# Patient Record
Sex: Male | Born: 1940 | Hispanic: No | Marital: Married | Smoking: Never smoker
Health system: Southern US, Community
[De-identification: ages and names within clinical notes are randomized; demographics above are authoritative.]

## PROBLEM LIST (undated history)

## (undated) DIAGNOSIS — N400 Enlarged prostate without lower urinary tract symptoms: Secondary | ICD-10-CM

## (undated) DIAGNOSIS — T7840XA Allergy, unspecified, initial encounter: Secondary | ICD-10-CM

## (undated) DIAGNOSIS — M109 Gout, unspecified: Secondary | ICD-10-CM

## (undated) DIAGNOSIS — D649 Anemia, unspecified: Secondary | ICD-10-CM

## (undated) DIAGNOSIS — M1990 Unspecified osteoarthritis, unspecified site: Secondary | ICD-10-CM

## (undated) DIAGNOSIS — M549 Dorsalgia, unspecified: Secondary | ICD-10-CM

## (undated) HISTORY — PX: LITHOTRIPSY: SUR834

## (undated) HISTORY — DX: Anemia, unspecified: D64.9

## (undated) HISTORY — DX: Unspecified osteoarthritis, unspecified site: M19.90

## (undated) HISTORY — PX: EYE SURGERY: SHX253

## (undated) HISTORY — DX: Allergy, unspecified, initial encounter: T78.40XA

## (undated) HISTORY — PX: OTHER SURGICAL HISTORY: SHX169

---

## 2016-02-28 ENCOUNTER — Emergency Department (HOSPITAL_COMMUNITY): Payer: Medicare Other

## 2016-02-28 ENCOUNTER — Emergency Department (HOSPITAL_COMMUNITY)
Admission: EM | Admit: 2016-02-28 | Discharge: 2016-02-28 | Disposition: A | Discharge status: Home / Self Care | Payer: Medicare Other | Attending: Emergency Medicine | Admitting: Emergency Medicine

## 2016-02-28 ENCOUNTER — Encounter (HOSPITAL_COMMUNITY): Payer: Self-pay

## 2016-02-28 DIAGNOSIS — Z79899 Other long term (current) drug therapy: Secondary | ICD-10-CM | POA: Insufficient documentation

## 2016-02-28 DIAGNOSIS — M5489 Other dorsalgia: Secondary | ICD-10-CM | POA: Diagnosis not present

## 2016-02-28 DIAGNOSIS — I10 Essential (primary) hypertension: Secondary | ICD-10-CM | POA: Diagnosis not present

## 2016-02-28 DIAGNOSIS — M545 Low back pain, unspecified: Secondary | ICD-10-CM

## 2016-02-28 DIAGNOSIS — R52 Pain, unspecified: Secondary | ICD-10-CM | POA: Diagnosis not present

## 2016-02-28 DIAGNOSIS — M5442 Lumbago with sciatica, left side: Secondary | ICD-10-CM | POA: Insufficient documentation

## 2016-02-28 HISTORY — DX: Dorsalgia, unspecified: M54.9

## 2016-02-28 MED ORDER — HYDROCODONE-ACETAMINOPHEN 5-325 MG PO TABS: 1.0000 | ORAL_TABLET | ORAL | 0 refills | Status: DC | PRN

## 2016-02-28 MED ORDER — FENTANYL CITRATE (PF) 100 MCG/2ML IJ SOLN
50.0000 ug | Freq: Once | INTRAMUSCULAR | Status: AC
  Administered 2016-02-28: 50 ug via INTRAVENOUS
  Filled 2016-02-28: qty 2

## 2016-02-28 NOTE — Progress Notes (Signed)
ED CM noted Medicaid of [place] pt without a pcp listed ED Cm assessed pt to confirm with him there is no pcp Pt [place] in [place]', is from [place]" He has his wife and daughter her who "went to school at [employer]" States daughter is a doctor "this is the reason I come here"  Cm discussed provider for follow up medical care [facility] and provided pt a list of [place]icaid providers to assist with his choice of medicaid providers/pcp Placed this list on pt's blue bag on bedside table Pt very appreciative of resources offered Pt offered warm blankets

## 2016-02-28 NOTE — Discharge Instructions (Signed)
Take your medications as prescribed as needed for pain relief. I recommend continuing to rest and refrain from doing any heavy lifting or repetitive movements exacerbate her symptoms for the next few days. I recommend calling the neurosurgery clinic listed below tomorrow to follow-up regarding your appointment with Dr. Conchita ParisNundkumar on Thursday morning.  Please return to the Emergency Department if symptoms worsen or new onset of fever, numbness, tingling, groin anesthesia, loss of bowel or bladder, weakness, abdominal pain, chest pain, difficulty breathing.

## 2016-02-28 NOTE — ED Provider Notes (Signed)
[facility]-EMERGENCY DEPT Provider Note   CSN: [id number] Arrival date & time: 02/28/16  1101     History   Chief Complaint Chief Complaint  Patient presents with  . Back Pain    HPI Levi Phillips is a 75 y.o. male.  HPI   Patient is a 75 year old male with history of low back pain who presents the ED via EMS with complaint of worsening low back pain. Patient reports a week ago when he was carrying a large crate of water bottles into his house he began having worsening left lower back pain. Patient reports history of low back pain and sciatica and notes his back pain today feels similar to episodes he has had in the past. Denies any recent fall, trauma or injury. Patient reports pain is worse with movement including bending and twisting. Patient states he has been taking anti-inflammatories at home without relief. He notes he has been seen by multiple doctors in [place] regarding his symptoms. He reports having an MRI performed in May 2017 in which he was told "the area of my back near L4 is moving". Patient states he was told by multiple doctors he needed to have surgery in his back but states when he went to have a second opinion he was told not to have surgery due to possible complications related to the surgery. Patient reports he has had difficulty walking at home due to his worsening pain. Pt denies fever, numbness, tingling, saddle anesthesia, loss of bowel or bladder, chest pain, SOB, abdominal pain, N/V, diarrhea, constipation, weakness, IVDU, cancer or recent spinal manipulation. EMS report that they found the patient crawling on the ground at home due to his back pain. EMS administered 100 mcg fentanyl en route, pt reports mild improvement of pain.   Past Medical History:  Diagnosis Date  . Back pain   . Hypertension     There are no active problems to display for this patient.   History reviewed. No pertinent surgical history.     Home Medications    Prior to Admission  medications   Medication Sig Start Date End Date Taking? Authorizing Provider  allopurinol (ZYLOPRIM) 100 MG tablet Take 100 mg by mouth daily.   Yes Historical Provider, MD  naproxen (NAPROSYN) 250 MG tablet Take 500 mg by mouth 2 (two) times daily as needed for moderate pain.   Yes Historical Provider, MD  tamsulosin (FLOMAX) 0.4 MG CAPS capsule Take 0.4 mg by mouth daily.   Yes Historical Provider, MD  HYDROcodone-acetaminophen (NORCO/VICODIN) 5-325 MG tablet Take 1 tablet by mouth every 4 (four) hours as needed. 02/28/16   Barrett Henle, PA-C    Family History No family history on file.  Social History Social History  Substance Use Topics  . Smoking status: Not on file  . Smokeless tobacco: Not on file  . Alcohol use Not on file     Allergies   Patient has no known allergies.   Review of Systems Review of Systems  Musculoskeletal: Positive for back pain.  All other systems reviewed and are negative.    Physical Exam Updated Vital Signs BP 125/77 (BP Location: Left Arm)   Pulse 71   Temp 97.5 F (36.4 C) (Oral)   Resp 19   Ht 5' 10.08" (1.78 m)   Wt 81.6 kg   SpO2 97%   BMI 25.77 kg/m   Physical Exam  Constitutional: He is oriented to person, place, and time. He appears well-developed and well-nourished.  Pt appears  to be in mild discomfort with certain movements  HENT:  Head: Normocephalic and atraumatic.  Eyes: Conjunctivae and EOM are normal. Right eye exhibits no discharge. Left eye exhibits no discharge. No scleral icterus.  Neck: Normal range of motion. Neck supple.  Cardiovascular: Normal rate, regular rhythm, normal heart sounds and intact distal pulses.   Pulmonary/Chest: Effort normal and breath sounds normal. No respiratory distress. He has no wheezes. He has no rales. He exhibits no tenderness.  Abdominal: Soft. Bowel sounds are normal. He exhibits no distension and no mass. There is no tenderness. There is no rebound and no guarding.    Musculoskeletal: Normal range of motion. He exhibits tenderness. He exhibits no edema or deformity.  No midline C, T, or L tenderness. TTP over left lumbar paraspinal muscles and left SI joint. Full range of motion of neck and decreased ROM of back due to reported pain. Pt able to sit up on the side of the bed but endorses worsening pain. Full range of motion of bilateral upper and lower extremities, with 5/5 strength, worsening back pain with full flexion and extension of left leg/hip. Sensation intact. 2+ radial and PT pulses. Cap refill <2 seconds. Unable to ambulate due to reported pain.   Neurological: He is alert and oriented to person, place, and time. He has normal strength. He displays normal reflexes. No sensory deficit.  Skin: Skin is warm and dry.  Nursing note and vitals reviewed.    ED Treatments / Results  Labs (all labs ordered are listed, but only abnormal results are displayed) Labs Reviewed - No data to display  EKG  EKG Interpretation None       Radiology Mr Lumbar Spine Wo Contrast  Result Date: 02/28/2016 CLINICAL DATA:  Low back pain radiating into both legs for 3 days. EXAM: MRI LUMBAR SPINE WITHOUT CONTRAST TECHNIQUE: Multiplanar, multisequence MR imaging of the lumbar spine was performed. No intravenous contrast was administered. COMPARISON:  None. FINDINGS: Segmentation: Normal. The lowest disc space is considered to be L5-S1. Alignment:  Grade 1 anterolisthesis at L4-L5. Vertebrae: No acute compression fracture, facet edema or focal marrow lesion. Conus medullaris: Extends to the L1 level and appears normal. Paraspinal and other soft tissues: The visualized aorta, IVC and iliac vessels are normal. The visualized retroperitoneal organs and paraspinal soft tissues are normal. Disc levels: T12-L1: Evaluated on sagittal images only. No significant disc herniation, spinal canal stenosis or neuroforaminal narrowing. L1-L2: Small left subarticular disc protrusion. No  spinal canal stenosis. No neuroforaminal stenosis. L2-L3: Disc desiccation with left eccentric bulge, which narrows the left lateral recess. No spinal canal stenosis. Mild left neuroforaminal stenosis. L3-L4: Disc desiccation and diffuse disc bulge with moderate bilateral facet hypertrophy. Central annular fissure. No spinal canal stenosis. Mild right and moderate left neuroforaminal stenosis. L4-L5: Grade 1 anterolisthesis with large right foraminal disc extrusion with superior migration, extending into the extraforaminal space. There is severe bilateral facet hypertrophy. Moderate spinal canal stenosis. Severe right neuroforaminal stenosis with impingement of the exiting right L4 nerve root. L5-S1: Medium-sized left subarticular disc extrusion with inferior migration narrows the left lateral recess and displaces the descending left S1 nerve root. No central spinal canal stenosis. No compressive neuroforaminal stenosis. IMPRESSION: 1. Large right foraminal/extraforaminal L4-L5 disc extrusion with superior migration, causing impingement of the exiting right L4 nerve root. There is also moderate to severe spinal canal stenosis at this level due to combination of disc disease and severe facet arthrosis. 2. Medium-sized left subarticular L5-S1 disc extrusion  with inferior migration narrowing the left lateral recess and displacing the descending left S1 nerve root. Correlate for left S1 radiculopathy. 3. 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 Electronically Signed   By: Deatra RobinsonKevin  Herman M.D.   On: 02/28/2016 13:35    Procedures Procedures (including critical care time)  Medications Ordered in ED Medications  fentaNYL (SUBLIMAZE) injection 50 mcg (50 mcg Intravenous Given 02/28/16 1152)  fentaNYL (SUBLIMAZE) injection 50 mcg (50 mcg Intravenous Given 02/28/16 1348)     Initial Impression / Assessment and Plan / ED Course  I have reviewed the triage vital signs and the nursing  notes.  Pertinent labs & imaging results that were available during my care of the patient were reviewed by me and considered in my medical decision making (see chart for details).  Clinical Course     Patient presents with worsening left lower back pain that radiates down the back of his left leg. Patient reports he has been evaluated in the past regarding his back pain. He reports being seen by multiple physicians in [place] where he is from and states he had an MRI performed in May 2017 showing movement near L4. Patient denies any recent falls but reports having worsening pain after carrying a large crate of water bottles last weekend. No neuro deficits. VSS. Exam revealed tenderness over left lumbar paraspinal muscles and left SI joint. Patient reports worsening pain with flexion and extension of left hip. Remaining exam unremarkable. No neuro deficits. Discussed pt with Dr. Judd Lienelo. Due to pt not having any medical care established in the [place], hx of sciatica and worsening back pain resulting in the pt being unable to ambulate at home, will order MRI for further evaluation. Patient given pain meds in the ED. MRI revealed large right foraminal L4-L5 disc extrusion with impingement to right L4 nerve root, moderate-severe spinal canal stenosis, medium left L5-S1 disc extrusion with migration narrowing the left lateral recess and displacing the descending left S1 nerve root. Consulted neurosurgery. Dr. Conchita ParisNundkumar reviewed pt's MRI and advised to control pt's pain and plan to have pt follow up with him in his office Thursday morning. On reevaluation patient is resting comfortably in bed and reports significant improvement of his pain. Patient able to stand and ambulate with mild discomfort. Discussed results and plan with patient.  Discussed strict return precautions. Patient reports understanding and agreement.  Final Clinical Impressions(s) / ED Diagnoses   Final diagnoses:  Low back pain  Acute left-sided low  back pain with left-sided sciatica    New Prescriptions New Prescriptions   HYDROCODONE-ACETAMINOPHEN (NORCO/VICODIN) 5-325 MG TABLET    Take 1 tablet by mouth every 4 (four) hours as needed.     Satira Sarkicole Elizabeth [place], [place]-C 02/28/16 1541    Geoffery Lyonsouglas Delo, MD 02/28/16 [phone]

## 2016-02-28 NOTE — ED Notes (Signed)
Patient transported to MRI 

## 2016-02-28 NOTE — Progress Notes (Signed)
Entered in d/c instructions Please use the list of [place] medicaid providers to assist with finding a provider for follow up medical care      If further assistance needed in finding medicaid providers you may also contact the Department of social services at [address] [place] [phone] Medicaid specialists can be viewed at [email or web address].ncdhhs.gov/   Next Steps: Schedule an appointment as soon as possible for a visit  Instructions: medicaid patients have access to medicaid transportation to appointments by calling [phone]  In [place] there is access to [place] resources [place] at [phone] for rides, meals on wheels or go to [email or web address]

## 2016-02-28 NOTE — ED Notes (Signed)
Patient returned from MRI and c/o pain. Jeanella CaraMade Nicole PA aware. Awaiting new orders.

## 2016-02-28 NOTE — ED Notes (Signed)
Went over discharge instructions and prescription for pain medication. patient request to go over instructions with his daughter when she comes to pick him up.  Daughter is at a doctor appt with patient's wife, so will be little while before she can get here to pick patient up.

## 2016-03-01 DIAGNOSIS — M4316 Spondylolisthesis, lumbar region: Secondary | ICD-10-CM | POA: Diagnosis not present

## 2016-03-08 ENCOUNTER — Other Ambulatory Visit: Payer: Self-pay | Admitting: Neurosurgery

## 2016-03-13 ENCOUNTER — Encounter (HOSPITAL_COMMUNITY)
Admission: RE | Admit: 2016-03-13 | Discharge: 2016-03-13 | Disposition: A | Discharge status: Home / Self Care | Payer: Medicare Other | Source: Ambulatory Visit | Attending: Neurosurgery | Admitting: Neurosurgery

## 2016-03-13 ENCOUNTER — Encounter (HOSPITAL_COMMUNITY): Payer: Self-pay

## 2016-03-13 DIAGNOSIS — Z01818 Encounter for other preprocedural examination: Secondary | ICD-10-CM | POA: Diagnosis not present

## 2016-03-13 DIAGNOSIS — M4316 Spondylolisthesis, lumbar region: Secondary | ICD-10-CM | POA: Diagnosis not present

## 2016-03-13 HISTORY — DX: Gout, unspecified: M10.9

## 2016-03-13 HISTORY — DX: Benign prostatic hyperplasia without lower urinary tract symptoms: N40.0

## 2016-03-13 LAB — BASIC METABOLIC PANEL
Anion gap: 10 (ref 5–15)
BUN: 16 mg/dL (ref 6–20)
CALCIUM: 9.8 mg/dL (ref 8.9–10.3)
CO2: 22 mmol/L (ref 22–32)
CREATININE: 1.13 mg/dL (ref 0.61–1.24)
Chloride: 105 mmol/L (ref 101–111)
Glucose, Bld: 98 mg/dL (ref 65–99)
Potassium: 4.3 mmol/L (ref 3.5–5.1)
SODIUM: 137 mmol/L (ref 135–145)

## 2016-03-13 LAB — CBC
HCT: 45.4 % (ref 39.0–52.0)
Hemoglobin: 16.3 g/dL (ref 13.0–17.0)
MCH: 30.5 pg (ref 26.0–34.0)
MCHC: 35.9 g/dL (ref 30.0–36.0)
MCV: 84.9 fL (ref 78.0–100.0)
PLATELETS: 160 10*3/uL (ref 150–400)
RBC: 5.35 MIL/uL (ref 4.22–5.81)
RDW: 13.1 % (ref 11.5–15.5)
WBC: 9.4 10*3/uL (ref 4.0–10.5)

## 2016-03-13 LAB — TYPE AND SCREEN
ABO/RH(D): O POS
ANTIBODY SCREEN: NEGATIVE

## 2016-03-13 LAB — SURGICAL PCR SCREEN
MRSA, PCR: NEGATIVE
STAPHYLOCOCCUS AUREUS: NEGATIVE

## 2016-03-13 LAB — ABO/RH: ABO/RH(D): O POS

## 2016-03-13 MED ORDER — CHLORHEXIDINE GLUCONATE CLOTH 2 % EX PADS: 6.0000 | MEDICATED_PAD | Freq: Once | CUTANEOUS | Status: DC

## 2016-03-13 NOTE — Pre-Procedure Instructions (Signed)
Waldemar DickensChanguiz Thelen  03/13/2016      [id number] - , [place] - [address] [place] [address] Phone: [phone] Fax: [phone]    Your procedure is scheduled on Fri, Jan 5 @ 1:15 PM  [facility] Admitting at 11:15 AM  Call this number if you have problems the morning of surgery:  [phone]   Remember:  Do not eat food or drink liquids after midnight.  Take these medicines the morning of surgery with A SIP OF WATER Allopurinol(Zyloprim),Flomax(Tamsulosin),and Pain Pill(if needed)             Stop taking your Naproxen. No Goody's,BC's,Advil,Motrin,Ibuprofen,Fish Oil,or any Herbal Medications.    Do not wear jewelry.  Do not wear lotions, powders,colognes, or deoderant.             Men may shave face and neck.  Do not bring valuables to the hospital.  [facility] is not responsible for any belongings or valuables.  Contacts, dentures or bridgework may not be worn into surgery.  Leave your suitcase in the car.  After surgery it may be brought to your room.  For patients admitted to the hospital, discharge time will be determined by your treatment team.  Patients discharged the day of surgery will not be allowed to drive home.    [facility] Pre[facility]  Before surgery, you can play an important role.  Because skin is not sterile, your skin needs to be as free of germs as possible.  You can reduce the number of germs on you skin by washing with CHG (chlorahexidine gluconate) soap before surgery.  CHG is an antiseptic cleaner which kills germs and bonds with the skin to continue killing germs even after washing.  Please DO NOT use if you have an allergy to CHG or antibacterial soaps.  If your skin becomes reddened/irritated stop using the CHG and inform your nurse when you arrive at Short Stay.  Do not shave (including legs and underarms) for at least 48 hours prior to the  first CHG shower.  You may shave your face.  Please follow these instructions carefully:   1.  Shower with CHG Soap the night before surgery and the                                morning of Surgery.  2.  If you choose to wash your hair, wash your hair first as usual with your       normal shampoo.  3.  After you shampoo, rinse your hair and body thoroughly to remove the                      Shampoo.  4.  Use CHG as you would any other liquid soap.  You can apply chg directly       to the skin and wash gently with scrungie or a clean washcloth.  5.  Apply the CHG Soap to your body ONLY FROM THE NECK DOWN.        Do not use on open wounds or open sores.  Avoid contact with your eyes,       ears, mouth and genitals (private parts).  Wash genitals (private parts)       with your normal soap.  6.  Wash thoroughly, paying special attention to the area  where your surgery        will be performed.  7.  Thoroughly rinse your body with warm water from the neck down.  8.  DO NOT shower/wash with your normal soap after using and rinsing off       the CHG Soap.  9.  Pat yourself dry with a clean towel.            10.  Wear clean pajamas.            11.  Place clean sheets on your bed the night of your first shower and do not        sleep with pets.  Day of Surgery  Do not apply any lotions/deoderants the morning of surgery.  Please wear clean clothes to the hospital/surgery center.    Please read over the following fact sheets that you were given. Pain Booklet, Coughing and Deep Breathing, MRSA Information and Surgical Site Infection Prevention

## 2016-03-13 NOTE — Progress Notes (Signed)
Cardiologist denies  Medical MD denies  Echo/stress test/heart cath denies  EKG and CXR denies in past yr

## 2016-03-16 ENCOUNTER — Encounter (HOSPITAL_COMMUNITY): Admission: RE | Payer: Self-pay | Source: Ambulatory Visit

## 2016-03-16 ENCOUNTER — Inpatient Hospital Stay (HOSPITAL_COMMUNITY): Admission: RE | Admit: 2016-03-16 | Payer: Medicaid Other | Source: Ambulatory Visit | Admitting: Neurosurgery

## 2016-03-16 SURGERY — POSTERIOR LUMBAR FUSION 1 LEVEL
Anesthesia: General

## 2016-03-26 DIAGNOSIS — M4310 Spondylolisthesis, site unspecified: Secondary | ICD-10-CM | POA: Diagnosis not present

## 2016-03-26 DIAGNOSIS — M5417 Radiculopathy, lumbosacral region: Secondary | ICD-10-CM | POA: Diagnosis not present

## 2016-03-26 DIAGNOSIS — N400 Enlarged prostate without lower urinary tract symptoms: Secondary | ICD-10-CM | POA: Diagnosis not present

## 2016-03-26 DIAGNOSIS — M545 Low back pain: Secondary | ICD-10-CM | POA: Diagnosis not present

## 2016-03-26 DIAGNOSIS — M4807 Spinal stenosis, lumbosacral region: Secondary | ICD-10-CM | POA: Diagnosis not present

## 2016-03-26 DIAGNOSIS — M109 Gout, unspecified: Secondary | ICD-10-CM | POA: Diagnosis not present

## 2016-04-17 DIAGNOSIS — G894 Chronic pain syndrome: Secondary | ICD-10-CM | POA: Diagnosis not present

## 2016-04-17 DIAGNOSIS — Z79891 Long term (current) use of opiate analgesic: Secondary | ICD-10-CM | POA: Diagnosis not present

## 2016-04-17 DIAGNOSIS — M545 Low back pain: Secondary | ICD-10-CM | POA: Diagnosis not present

## 2016-04-17 DIAGNOSIS — M5442 Lumbago with sciatica, left side: Secondary | ICD-10-CM | POA: Diagnosis not present

## 2016-04-17 DIAGNOSIS — M5137 Other intervertebral disc degeneration, lumbosacral region: Secondary | ICD-10-CM | POA: Diagnosis not present

## 2016-04-23 DIAGNOSIS — M545 Low back pain: Secondary | ICD-10-CM | POA: Diagnosis not present

## 2016-05-22 DIAGNOSIS — I10 Essential (primary) hypertension: Secondary | ICD-10-CM | POA: Diagnosis not present

## 2016-05-22 DIAGNOSIS — R404 Transient alteration of awareness: Secondary | ICD-10-CM | POA: Diagnosis not present

## 2016-05-22 DIAGNOSIS — M542 Cervicalgia: Secondary | ICD-10-CM | POA: Diagnosis not present

## 2016-05-22 DIAGNOSIS — R110 Nausea: Secondary | ICD-10-CM | POA: Diagnosis not present

## 2016-05-22 DIAGNOSIS — Z79899 Other long term (current) drug therapy: Secondary | ICD-10-CM | POA: Diagnosis not present

## 2016-05-22 DIAGNOSIS — R9431 Abnormal electrocardiogram [ECG] [EKG]: Secondary | ICD-10-CM | POA: Diagnosis not present

## 2016-05-22 DIAGNOSIS — R42 Dizziness and giddiness: Secondary | ICD-10-CM | POA: Diagnosis not present

## 2016-05-22 DIAGNOSIS — R0602 Shortness of breath: Secondary | ICD-10-CM | POA: Diagnosis not present

## 2016-05-22 DIAGNOSIS — R079 Chest pain, unspecified: Secondary | ICD-10-CM | POA: Diagnosis not present

## 2016-05-22 DIAGNOSIS — R531 Weakness: Secondary | ICD-10-CM | POA: Diagnosis not present

## 2016-05-22 DIAGNOSIS — R0682 Tachypnea, not elsewhere classified: Secondary | ICD-10-CM | POA: Diagnosis not present

## 2016-06-01 DIAGNOSIS — M4310 Spondylolisthesis, site unspecified: Secondary | ICD-10-CM | POA: Diagnosis not present

## 2016-06-01 DIAGNOSIS — Z5181 Encounter for therapeutic drug level monitoring: Secondary | ICD-10-CM | POA: Diagnosis not present

## 2016-06-01 DIAGNOSIS — Z136 Encounter for screening for cardiovascular disorders: Secondary | ICD-10-CM | POA: Diagnosis not present

## 2016-06-01 DIAGNOSIS — N400 Enlarged prostate without lower urinary tract symptoms: Secondary | ICD-10-CM | POA: Diagnosis not present

## 2016-06-01 DIAGNOSIS — Z0000 Encounter for general adult medical examination without abnormal findings: Secondary | ICD-10-CM | POA: Diagnosis not present

## 2016-06-01 DIAGNOSIS — M5417 Radiculopathy, lumbosacral region: Secondary | ICD-10-CM | POA: Diagnosis not present

## 2016-06-01 DIAGNOSIS — Z131 Encounter for screening for diabetes mellitus: Secondary | ICD-10-CM | POA: Diagnosis not present

## 2016-06-01 DIAGNOSIS — M109 Gout, unspecified: Secondary | ICD-10-CM | POA: Diagnosis not present

## 2016-06-01 DIAGNOSIS — Z01118 Encounter for examination of ears and hearing with other abnormal findings: Secondary | ICD-10-CM | POA: Diagnosis not present

## 2016-06-29 DIAGNOSIS — M5417 Radiculopathy, lumbosacral region: Secondary | ICD-10-CM | POA: Diagnosis not present

## 2016-06-29 DIAGNOSIS — N400 Enlarged prostate without lower urinary tract symptoms: Secondary | ICD-10-CM | POA: Diagnosis not present

## 2016-06-29 DIAGNOSIS — K5900 Constipation, unspecified: Secondary | ICD-10-CM | POA: Diagnosis not present

## 2016-06-29 DIAGNOSIS — M545 Low back pain: Secondary | ICD-10-CM | POA: Diagnosis not present

## 2016-06-29 DIAGNOSIS — M109 Gout, unspecified: Secondary | ICD-10-CM | POA: Diagnosis not present

## 2016-06-29 DIAGNOSIS — M4310 Spondylolisthesis, site unspecified: Secondary | ICD-10-CM | POA: Diagnosis not present

## 2017-01-09 ENCOUNTER — Encounter: Payer: Self-pay | Admitting: Physician Assistant

## 2017-01-09 ENCOUNTER — Ambulatory Visit (INDEPENDENT_AMBULATORY_CARE_PROVIDER_SITE_OTHER): Payer: Medicare Other | Admitting: Physician Assistant

## 2017-01-09 VITALS — BP 120/78 | HR 77 | Temp 98.0°F | Resp 18 | Ht 69.0 in | Wt 186.4 lb

## 2017-01-09 DIAGNOSIS — M109 Gout, unspecified: Secondary | ICD-10-CM | POA: Insufficient documentation

## 2017-01-09 DIAGNOSIS — M25561 Pain in right knee: Secondary | ICD-10-CM | POA: Diagnosis not present

## 2017-01-09 DIAGNOSIS — M5136 Other intervertebral disc degeneration, lumbar region: Secondary | ICD-10-CM | POA: Insufficient documentation

## 2017-01-09 DIAGNOSIS — Z6827 Body mass index (BMI) 27.0-27.9, adult: Secondary | ICD-10-CM | POA: Insufficient documentation

## 2017-01-09 DIAGNOSIS — N400 Enlarged prostate without lower urinary tract symptoms: Secondary | ICD-10-CM | POA: Insufficient documentation

## 2017-01-09 NOTE — Progress Notes (Signed)
Patient ID: Levi Phillips, male     DOB: 02-May-1940, 76 y.o.    MRN: [id number]  PCP: Patient, No Pcp Per  Chief Complaint  Patient presents with  . Knee Pain    right knee, x2 month, Pt states he does 15 mintues workouts on a bike at the gym his knee hurts less but when he touches his knee it hurts.    Subjective:   This patient is new to this practice and presents for evaluation of RIGHT knee pain x 2 months.  He does not recall any specific trauma or injury. The medial aspect of the knee is tender on palpation. Exercise of a stationary bike helps. Pain is rated 6/10, but is not present now.  He does daily exercise due to L4-5 disc herniation, and he can walk 45 minutes and ride the bike without pain.  No hip or ankle pain. Different from gout.  Review of Systems   Prior to Admission medications   Medication Sig Start Date End Date Taking? Authorizing Provider  allopurinol (ZYLOPRIM) 100 MG tablet Take 100 mg by mouth daily.   Yes [provider]  tamsulosin (FLOMAX) 0.4 MG CAPS capsule Take 0.4 mg by mouth.   Yes [provider]     No Known Allergies   Patient Active Problem List   Diagnosis Date Noted  . DDD (degenerative disc disease), lumbar 01/09/2017  . Gout 01/09/2017  . BPH (benign prostatic hyperplasia) 01/09/2017  . BMI 27.0-27.9,adult 01/09/2017     Family History  Problem Relation Age of Onset  . Cancer Mother   . Alzheimer's disease Sister   . Alzheimer's disease Sister      Social History   Socioeconomic History  . Marital status: Married    Spouse name: Not on file  . Number of children: Not on file  . Years of education: Not on file  . Highest education level: Not on file  Social Needs  . Financial resource strain: Not on file  . Food insecurity - worry: Not on file  . Food insecurity - inability: Not on file  . Transportation needs - medical: Not on file  . Transportation needs - non-medical: Not on  file  Occupational History  . Not on file  Tobacco Use  . Smoking status: Never Smoker  . Smokeless tobacco: Never Used  Substance and Sexual Activity  . Alcohol use: Yes    Comment: occ beer  . Drug use: No  . Sexual activity: Not on file  Other Topics Concern  . Not on file  Social History Narrative   ** Merged History Encounter **             Objective:  Physical Exam  Constitutional: He is oriented to person, place, and time. He appears well-developed and well-nourished. He is active and cooperative. No distress.  BP 120/78 (BP Location: Left Arm, Patient Position: Sitting, Cuff Size: Normal)   Pulse 77   Temp 98 F (36.7 C) (Oral)   Resp 18   Ht 5\' 9"  (1.753 m)   Wt 186 lb 6.4 oz (84.6 kg)   SpO2 96%   BMI 27.53 kg/m    Eyes: Conjunctivae are normal.  Pulmonary/Chest: Effort normal.  Musculoskeletal:       Right knee: He exhibits normal range of motion (crepitus), no swelling, no effusion, no ecchymosis, no deformity, no laceration, no erythema, normal alignment, no LCL laxity, normal patellar mobility, no bony tenderness, normal meniscus and  no MCL laxity. Tenderness found. Medial joint line (and just below, at the anserine bursa) tenderness noted. No lateral joint line, no MCL, no LCL and no patellar tendon tenderness noted.       Left knee: Normal. Decreased range of motion: crepitus.       Right upper leg: Normal.       Left upper leg: Normal.       Right lower leg: Normal.       Left lower leg: Normal.  Neurological: He is alert and oriented to person, place, and time.  Psychiatric: He has a normal mood and affect. His speech is normal and behavior is normal.             Assessment & Plan:  1. Right medial knee pain Unclear etiology. ?Anserine bursitis? ?Strain? ?Meniscal injury? Acetaminophen PRN. Continue exercise. If pain persists in 2 weeks would proceed with radiographs.    Return in about 2 weeks (around 01/23/2017) for re-evalaution if the  pain continues; please schedule a Wellness Visit with Dorthula Perfect, LPN.   Fernande Bras, PA-C Primary Care at [facility]

## 2017-01-09 NOTE — Patient Instructions (Addendum)
CONTINUE the exercise, as long as it doesn't hurt the knee. TRY acetaminophen (Tylenol) for the pain, as needed.    IF you received an x-ray today, you will receive an invoice from [facility]. Please contact [facility] at [phone] with questions or concerns regarding your invoice.   IF you received labwork today, you will receive an invoice from [facility]. Please contact [facility] at [phone] with questions or concerns regarding your invoice.   Our billing staff will not be able to assist you with questions regarding bills from these companies.  You will be contacted with the lab results as soon as they are available. The fastest way to get your results is to activate your My Chart account. Instructions are located on the last page of this paperwork. If you have not heard from us regarding the results in 2 weeks, please contact this office.

## 2017-01-09 NOTE — Progress Notes (Signed)
   Subjective:    Patient ID: Levi Phillips, male    DOB: Oct 31, 1940, 76 y.o.   MRN: [id number]  HPI Levi Phillips is a 76 year old [place] male with a past medical history significant for arthritis and gout who presents today with right medial knee pain. He states the pain started about 1 month ago. Levi Phillips reports his right knee pain was worse when he touched his knee and improved when he went on the exercise bike. The pain was 6/10. Levi Phillips states he does exercises every day for an L4 and L5 herniated disc. He did not take any medications or apply ice at that time. Levi Phillips reports the pain has now improved. Levi Phillips denies leg weakness. He also states the pain does not radiate anywhere. Levi Phillips reports he walks about 45 minutes every day and he does not have any pain. Levi Phillips also denies pain in his hip or ankle.   Medications:  Prior to Admission medications   Medication Sig Start Date End Date Taking? Authorizing Provider  allopurinol (ZYLOPRIM) 100 MG tablet Take 100 mg by mouth daily.   Yes [provider]  tamsulosin (FLOMAX) 0.4 MG CAPS capsule Take 0.4 mg by mouth.   Yes [provider]   Allergies:  No Known Allergies  Chronic Medical Conditions:  Allergic Rhinitis  Anemia  Depression  Review of Systems     Objective:   Physical Exam  Constitutional: He appears well-developed and well-nourished. He is active and cooperative.  BP 120/78 (BP Location: Left Arm, Patient Position: Sitting, Cuff Size: Normal)   Pulse 77   Temp 98 F (36.7 C) (Oral)   Resp 18   Ht 5\' 9"  (1.753 m)   Wt 186 lb 6.4 oz (84.6 kg)   SpO2 96%   BMI 27.53 kg/m    Musculoskeletal:       Right knee: He exhibits normal range of motion, no swelling, no effusion, no ecchymosis, no deformity, no erythema, no LCL laxity, normal meniscus and no MCL laxity. Tenderness found. Medial joint line tenderness noted.       Left knee: He exhibits normal range of motion, no  swelling, no effusion, no ecchymosis, no deformity, no erythema, normal alignment, no LCL laxity, normal meniscus and no MCL laxity. No tenderness found.       Legs: Neurological: He is alert.  Skin: Skin is warm and dry.      Assessment & Plan:  1. Right medial knee pain - Begin Tylenol as needed for pain  - Encouraged patient to continue exercise as tolerated - Patient instructed to return to clinic in 2 weeks if symptoms do not resolve  Magdaleno Lortie, PA-S

## 2017-01-10 ENCOUNTER — Encounter (HOSPITAL_COMMUNITY): Payer: Self-pay

## 2017-01-23 ENCOUNTER — Ambulatory Visit: Payer: Medicare Other | Admitting: Physician Assistant

## 2017-04-10 ENCOUNTER — Ambulatory Visit (INDEPENDENT_AMBULATORY_CARE_PROVIDER_SITE_OTHER): Payer: Medicare Other | Admitting: Emergency Medicine

## 2017-04-10 ENCOUNTER — Encounter: Payer: Self-pay | Admitting: Emergency Medicine

## 2017-04-10 ENCOUNTER — Other Ambulatory Visit: Payer: Self-pay

## 2017-04-10 ENCOUNTER — Emergency Department (HOSPITAL_COMMUNITY): Payer: Medicare Other

## 2017-04-10 ENCOUNTER — Encounter (HOSPITAL_COMMUNITY): Payer: Self-pay | Admitting: Emergency Medicine

## 2017-04-10 ENCOUNTER — Emergency Department (HOSPITAL_COMMUNITY)
Admission: EM | Admit: 2017-04-10 | Discharge: 2017-04-10 | Disposition: A | Discharge status: Home / Self Care | Payer: Medicare Other | Attending: Emergency Medicine | Admitting: Emergency Medicine

## 2017-04-10 VITALS — BP 189/129 | HR 78 | Temp 97.8°F | Resp 16 | Wt 179.0 lb

## 2017-04-10 DIAGNOSIS — N1330 Unspecified hydronephrosis: Secondary | ICD-10-CM | POA: Diagnosis not present

## 2017-04-10 DIAGNOSIS — R1032 Left lower quadrant pain: Secondary | ICD-10-CM | POA: Diagnosis present

## 2017-04-10 DIAGNOSIS — R109 Unspecified abdominal pain: Secondary | ICD-10-CM

## 2017-04-10 DIAGNOSIS — Z79899 Other long term (current) drug therapy: Secondary | ICD-10-CM | POA: Diagnosis not present

## 2017-04-10 DIAGNOSIS — N23 Unspecified renal colic: Secondary | ICD-10-CM | POA: Insufficient documentation

## 2017-04-10 DIAGNOSIS — M545 Low back pain, unspecified: Secondary | ICD-10-CM | POA: Insufficient documentation

## 2017-04-10 DIAGNOSIS — Z87442 Personal history of urinary calculi: Secondary | ICD-10-CM | POA: Diagnosis not present

## 2017-04-10 LAB — BASIC METABOLIC PANEL
Anion gap: 11 (ref 5–15)
BUN: 21 mg/dL — AB (ref 6–20)
CALCIUM: 9.5 mg/dL (ref 8.9–10.3)
CO2: 20 mmol/L — ABNORMAL LOW (ref 22–32)
CREATININE: 1.35 mg/dL — AB (ref 0.61–1.24)
Chloride: 102 mmol/L (ref 101–111)
GFR calc Af Amer: 57 mL/min — ABNORMAL LOW (ref >60)
GFR, EST NON AFRICAN AMERICAN: 49 mL/min — AB (ref >60)
GLUCOSE: 109 mg/dL — AB (ref 65–99)
Potassium: 3.6 mmol/L (ref 3.5–5.1)
Sodium: 133 mmol/L — ABNORMAL LOW (ref 135–145)

## 2017-04-10 LAB — POC URINALSYSI DIPSTICK (AUTOMATED)
Bilirubin, UA: NEGATIVE
Glucose, UA: NEGATIVE
KETONES UA: NEGATIVE
Leukocytes, UA: NEGATIVE
Nitrite, UA: NEGATIVE
PH UA: 6 (ref 5.0–8.0)
PROTEIN UA: NEGATIVE
SPEC GRAV UA: 1.025 (ref 1.010–1.025)
UROBILINOGEN UA: 0.2 U/dL

## 2017-04-10 LAB — CBC WITH DIFFERENTIAL/PLATELET
BASOS ABS: 0 10*3/uL (ref 0.0–0.1)
BASOS PCT: 0 %
EOS ABS: 0 10*3/uL (ref 0.0–0.7)
EOS PCT: 0 %
HCT: 40.4 % (ref 39.0–52.0)
Hemoglobin: 14.1 g/dL (ref 13.0–17.0)
LYMPHS PCT: 9 %
Lymphs Abs: 0.9 10*3/uL (ref 0.7–4.0)
MCH: 29.7 pg (ref 26.0–34.0)
MCHC: 34.9 g/dL (ref 30.0–36.0)
MCV: 85.2 fL (ref 78.0–100.0)
MONO ABS: 0.4 10*3/uL (ref 0.1–1.0)
Monocytes Relative: 4 %
Neutro Abs: 8.6 10*3/uL — ABNORMAL HIGH (ref 1.7–7.7)
Neutrophils Relative %: 87 %
PLATELETS: 185 10*3/uL (ref 150–400)
RBC: 4.74 MIL/uL (ref 4.22–5.81)
RDW: 13.4 % (ref 11.5–15.5)
WBC: 9.9 10*3/uL (ref 4.0–10.5)

## 2017-04-10 MED ORDER — KETOROLAC TROMETHAMINE 15 MG/ML IJ SOLN
15.0000 mg | Freq: Once | INTRAMUSCULAR | Status: AC
  Administered 2017-04-10: 15 mg via INTRAVENOUS
  Filled 2017-04-10: qty 1

## 2017-04-10 MED ORDER — OXYCODONE-ACETAMINOPHEN 5-325 MG PO TABS: 2.0000 | ORAL_TABLET | ORAL | 0 refills | Status: DC | PRN

## 2017-04-10 MED ORDER — ONDANSETRON HCL 4 MG PO TABS: 4.0000 mg | ORAL_TABLET | Freq: Four times a day (QID) | ORAL | 0 refills | Status: DC

## 2017-04-10 MED ORDER — KETOROLAC TROMETHAMINE 60 MG/2ML IM SOLN
60.0000 mg | Freq: Once | INTRAMUSCULAR | Status: AC
  Administered 2017-04-10: 60 mg via INTRAMUSCULAR

## 2017-04-10 MED ORDER — ONDANSETRON HCL 4 MG/2ML IJ SOLN
4.0000 mg | Freq: Once | INTRAMUSCULAR | Status: AC
  Administered 2017-04-10: 4 mg via INTRAVENOUS
  Filled 2017-04-10: qty 2

## 2017-04-10 MED ORDER — HYDROMORPHONE HCL 1 MG/ML IJ SOLN
0.5000 mg | Freq: Once | INTRAMUSCULAR | Status: AC
  Administered 2017-04-10: 0.5 mg via INTRAVENOUS
  Filled 2017-04-10: qty 1

## 2017-04-10 MED ORDER — SODIUM CHLORIDE 0.9 % IV BOLUS (SEPSIS)
1000.0000 mL | Freq: Once | INTRAVENOUS | Status: AC
  Administered 2017-04-10: 1000 mL via INTRAVENOUS

## 2017-04-10 MED ORDER — HYDROCODONE-ACETAMINOPHEN 5-325 MG PO TABS: 1.0000 | ORAL_TABLET | Freq: Four times a day (QID) | ORAL | 0 refills | Status: DC | PRN

## 2017-04-10 NOTE — ED Triage Notes (Signed)
Per EMS, patient coming from home, seen at PCP this morning for left side flank pain. Hx kidney stones. Reports he received Toradol IM at PCP with some relief. Reports nausea, denies V/D.

## 2017-04-10 NOTE — ED Notes (Signed)
Patient in radiology

## 2017-04-10 NOTE — ED Notes (Signed)
Pt given urinal and made aware of need for urine specimen 

## 2017-04-10 NOTE — ED Provider Notes (Signed)
[facility]-EMERGENCY DEPT Provider Note   CSN: [id number] Arrival date & time: 04/10/17  1512     History   Chief Complaint Chief Complaint  Patient presents with  . Flank Pain    HPI Levi Phillips is a 77 y.o. male.  77 year old male with prior history of renal colic, gout, prostatic hyperplasia, and arthritis resents with complaint of left flank pain.  Patient reports that this pain is consistent with his prior episodes of renal colic.  Patient reports that his pain started earlier this morning.  He was seen by his regular medical provider this morning for the same complaint.  Apparently at that time he was given a shot of Toradol and instructed to come to the ED if his pain worsens.  He now complains of significant increase in the left flank pain.  He has associated nausea.  He reports that it is difficult to urinate today.  Patient denies diffuse abdominal pain.  He denies fever. He denies chest pain.    The history is provided by the patient, medical records and the spouse.  Flank Pain  This is a recurrent problem. The current episode started 12 to 24 hours ago. The problem occurs rarely. The problem has not changed since onset.Pertinent negatives include no chest pain and no abdominal pain. Nothing aggravates the symptoms. Nothing relieves the symptoms. The treatment provided no relief.    Past Medical History:  Diagnosis Date  . Allergy   . Anemia   . Arthritis   . Back pain    spondylolisthesis  . Enlarged prostate    takes Flomax daily  . Gout    takes Allopurinol daily    Patient Active Problem List   Diagnosis Date Noted  . Acute left-sided low back pain 04/10/2017  . Acute left flank pain 04/10/2017  . History of kidney stones 04/10/2017  . 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 DDD (degenerative disc disease), lumbar 01/09/2017  . Gout 01/09/2017  . BPH (benign prostatic hyperplasia) 01/09/2017  . BMI 27.0-27.9,adult 01/09/2017     Past Surgical History:  Procedure Laterality Date  . cataract surgery Bilateral   . EYE SURGERY         Home Medications    Prior to Admission medications   Medication Sig Start Date End Date Taking? Authorizing Provider  allopurinol (ZYLOPRIM) 100 MG tablet Take 100 mg by mouth daily.    [provider]  allopurinol (ZYLOPRIM) 100 MG tablet Take 100 mg by mouth daily.    [provider]  HYDROcodone-acetaminophen (NORCO) 5-325 MG tablet Take 1 tablet by mouth every 6 (six) hours as needed for moderate pain. 04/10/17   Georgina Quint, MD  naproxen (NAPROSYN) 250 MG tablet Take 500 mg by mouth 2 (two) times daily as needed for moderate pain.    [provider]  tamsulosin (FLOMAX) 0.4 MG CAPS capsule Take 0.4 mg by mouth daily.    [provider]  tamsulosin (FLOMAX) 0.4 MG CAPS capsule Take 0.4 mg by mouth.    [provider]    Family History Family History  Problem Relation Age of Onset  . Cancer Mother   . Alzheimer's disease Sister   . Alzheimer's disease Sister     Social History Social History   Tobacco Use  . Smoking status: Never Smoker  . Smokeless tobacco: Never Used  Substance Use Topics  . Alcohol use: Yes    Comment: occ beer  . Drug use:  No     Allergies   Patient has no known allergies.   Review of Systems Review of Systems  Cardiovascular: Negative for chest pain.  Gastrointestinal: Negative for abdominal pain.  Genitourinary: Positive for flank pain.  All other systems reviewed and are negative.    Physical Exam Updated Vital Signs BP (!) 156/97 (BP Location: Right Arm)   Pulse 72   Temp 98.1 F (36.7 C) (Oral)   Resp 16   SpO2 100%   Physical Exam  Constitutional: He is oriented to person, place, and time. He appears well-developed and well-nourished. He appears distressed.  Moderate discomfort   HENT:  Head: Normocephalic and atraumatic.  Mouth/Throat: Oropharynx is clear  and moist.  Eyes: Conjunctivae and EOM are normal. Pupils are equal, round, and reactive to light.  Neck: Normal range of motion. Neck supple.  Cardiovascular: Normal rate, regular rhythm and normal heart sounds.  Pulmonary/Chest: Effort normal and breath sounds normal. No respiratory distress.  Abdominal: Soft. He exhibits no distension. There is no tenderness.  Genitourinary:  Genitourinary Comments: Left CVAT present   Musculoskeletal: Normal range of motion. He exhibits no edema or deformity.  Neurological: He is alert and oriented to person, place, and time.  Skin: Skin is warm and dry.  Psychiatric: He has a normal mood and affect.  Nursing note and vitals reviewed.    ED Treatments / Results  Labs (all labs ordered are listed, but only abnormal results are displayed) Labs Reviewed  BASIC METABOLIC PANEL - Abnormal; Notable for the following components:      Result Value   Sodium 133 (*)    CO2 20 (*)    Glucose, Bld 109 (*)    BUN 21 (*)    Creatinine, Ser 1.35 (*)    GFR calc non Af Amer 49 (*)    GFR calc Af Amer 57 (*)    All other components within normal limits  CBC WITH DIFFERENTIAL/PLATELET - Abnormal; Notable for the following components:   Neutro Abs 8.6 (*)    All other components within normal limits  URINALYSIS, ROUTINE W REFLEX MICROSCOPIC    EKG  EKG Interpretation None       Radiology Ct Abdomen Pelvis Wo Contrast  Result Date: 04/10/2017 CLINICAL DATA:  LEFT flank pain and nausea, history kidney stones EXAM: CT ABDOMEN AND PELVIS WITHOUT CONTRAST TECHNIQUE: Multidetector CT imaging of the abdomen and pelvis was performed following the standard protocol without IV contrast. Sagittal and coronal MPR images reconstructed from axial data set. Oral contrast not administered for this indication. COMPARISON:  None FINDINGS: Lower chest: Bibasilar interstitial changes. No segmental consolidation or pleural effusion. Hepatobiliary: Gallbladder and liver  normal appearance Pancreas: Normal appearance Spleen: Normal appearance Adrenals/Urinary Tract: Adrenal glands normal appearance. Multiple BILATERAL nonobstructing renal calculi. In addition, LEFT hydronephrosis and hydroureter secondary to a 5 mm LEFT UVJ calculus. LEFT peripelvic and perinephric edema extending along proximal LEFT ureter. No RIGHT hydronephrosis or hydroureter. Bladder otherwise unremarkable. Stomach/Bowel: Normal appendix. Stomach and bowel loops normal appearance for technique. Vascular/Lymphatic: Atherosclerotic calcifications aorta, iliac arteries, and coronary arteries. Aorta normal caliber. Scattered pelvic phleboliths. No adenopathy. Reproductive: Prostatic enlargement gland measuring 6.1 x 4.1 cm image 89. Seminal vesicles unremarkable. Other: No free air or free fluid. No hernia or inflammatory process. Musculoskeletal: Osseous demineralization with facet degenerative changes in the lumbar spine associated with grade 1 spondylolisthesis and pseudo disc formation at L4-L5. IMPRESSION: LEFT hydronephrosis and hydroureter secondary to a 5 mm LEFT UPJ  calculus. Additional BILATERAL nonobstructing renal calculi. Prostatic enlargement. Significant atherosclerotic calcifications including coronary arteries. Electronically Signed   By: Ulyses Southward M.D.   On: 04/10/2017 17:34    Procedures Procedures (including critical care time)  Medications Ordered in ED Medications  sodium chloride 0.9 % bolus 1,000 mL (not administered)  HYDROmorphone (DILAUDID) injection 0.5 mg (not administered)  ondansetron (ZOFRAN) injection 4 mg (not administered)     Initial Impression / Assessment and Plan / ED Course  I have reviewed the triage vital signs and the nursing notes.  Pertinent labs & imaging results that were available during my care of the patient were reviewed by me and considered in my medical decision making (see chart for details).     MDM  Screen complete  Patient with  presentation consistent with renal colic.  CT imaging reveals left 5 mm UPJ stone.  Following initial medication for pain patient is significantly improved.  After IV fluids and period of observation patient desires discharge home.  Close follow-up with urology is advised.  Strict return precautions given and understood.  Final Clinical Impressions(s) / ED Diagnoses   Final diagnoses:  Renal colic on left side    ED Discharge Orders        Ordered    ondansetron (ZOFRAN) 4 MG tablet  Every 6 hours     04/10/17 1923    oxyCODONE-acetaminophen (PERCOCET/ROXICET) 5-325 MG tablet  Every 4 hours PRN     04/10/17 1923       Wynetta Fines, MD 04/10/17 1924

## 2017-04-10 NOTE — Progress Notes (Signed)
Levi Phillips 77 y.o.   Chief Complaint  Patient presents with  . Flank Pain    this morning per patient kidney pain on LEFT SIDE    HISTORY OF PRESENT ILLNESS: [name] with history of kidney stones complaining of acute onset of left flank pain that started this morning.  Pain is sharp localized.  No radiation no associated symptomatology.  Patient also has a history of lumbar disease and chronic low back pain.  Denies fever, chills, abdominal pain, nausea or vomiting.  Flank Pain  This is a new problem. The current episode started today. The problem occurs constantly. The problem has been waxing and waning since onset. Pain location: left flank/lumbar areas. The pain does not radiate. The pain is at a severity of 7/10. The pain is moderate. The pain is the same all the time. Pertinent negatives include no abdominal pain, bladder incontinence, bowel incontinence, chest pain, dysuria, fever, headaches, leg pain, numbness, paresis, paresthesias, pelvic pain, perianal numbness, tingling or weakness. Risk factors: none. He has tried nothing for the symptoms.     Prior to Admission medications   Medication Sig Start Date End Date Taking? Authorizing Provider  allopurinol (ZYLOPRIM) 100 MG tablet Take 100 mg by mouth daily.   Yes [provider]  allopurinol (ZYLOPRIM) 100 MG tablet Take 100 mg by mouth daily.    [provider]  HYDROcodone-acetaminophen (NORCO/VICODIN) 5-325 MG tablet Take 1 tablet by mouth every 4 (four) hours as needed. Patient not taking: Reported on 04/10/2017 02/28/16   Barrett Henle, PA-C  naproxen (NAPROSYN) 250 MG tablet Take 500 mg by mouth 2 (two) times daily as needed for moderate pain.    [provider]  tamsulosin (FLOMAX) 0.4 MG CAPS capsule Take 0.4 mg by mouth daily.    [provider]  tamsulosin (FLOMAX) 0.4 MG CAPS capsule Take 0.4 mg by mouth.    [provider]    No Known  Allergies  Patient Active Problem List   Diagnosis Date Noted  . DDD (degenerative disc disease), lumbar 01/09/2017  . Gout 01/09/2017  . BPH (benign prostatic hyperplasia) 01/09/2017  . BMI 27.0-27.9,adult 01/09/2017    Past Medical History:  Diagnosis Date  . Allergy   . Anemia   . Arthritis   . Back pain    spondylolisthesis  . Enlarged prostate    takes Flomax daily  . Gout    takes Allopurinol daily    Past Surgical History:  Procedure Laterality Date  . cataract surgery Bilateral   . EYE SURGERY      Social History   Socioeconomic History  . Marital status: Married    Spouse name: Not on file  . Number of children: Not on file  . Years of education: Not on file  . Highest education level: Not on file  Social Needs  . Financial resource strain: Not on file  . Food insecurity - worry: Not on file  . Food insecurity - inability: Not on file  . Transportation needs - medical: Not on file  . Transportation needs - non-medical: Not on file  Occupational History  . Not on file  Tobacco Use  . Smoking status: Never Smoker  . Smokeless tobacco: Never Used  Substance and Sexual Activity  . Alcohol use: Yes    Comment: occ beer  . Drug use: No  . Sexual activity: Not on file  Other Topics Concern  . Not on file  Social  History Narrative   ** Merged History Encounter **        Family History  Problem Relation Age of Onset  . Cancer Mother   . Alzheimer's disease Sister   . Alzheimer's disease Sister      Review of Systems  Constitutional: Negative for fever.  HENT: Negative for sore throat.   Eyes: Negative for blurred vision and double vision.  Respiratory: Negative for cough, hemoptysis and shortness of breath.   Cardiovascular: Negative for chest pain and palpitations.  Gastrointestinal: Negative for abdominal pain, blood in stool, bowel incontinence, diarrhea, melena, nausea and vomiting.  Genitourinary: Positive for flank pain. Negative for  bladder incontinence, dysuria and pelvic pain.  Musculoskeletal: Positive for back pain.  Skin: Negative for rash.  Neurological: Negative.  Negative for dizziness, tingling, speech change, focal weakness, weakness, numbness, headaches and paresthesias.  Endo/Heme/Allergies: Negative.   All other systems reviewed and are negative.   Vitals:   04/10/17 1113  BP: (!) 189/129  Pulse: 78  Resp: 16  Temp: 97.8 F (36.6 C)  SpO2: 97%    Physical Exam  Constitutional: He is oriented to person, place, and time. He appears well-developed and well-nourished. He appears distressed (mild).  HENT:  Head: Normocephalic and atraumatic.  Eyes: Pupils are equal, round, and reactive to light.  Neck: Normal range of motion. Neck supple.  Cardiovascular: Normal rate and regular rhythm.  Pulmonary/Chest: Effort normal and breath sounds normal.  Abdominal: Soft. Bowel sounds are normal. He exhibits no distension. There is no tenderness.  Left flank tenderness  Musculoskeletal:       Lumbar back: He exhibits decreased range of motion and tenderness. He exhibits no bony tenderness.  Neurological: He is alert and oriented to person, place, and time. He displays normal reflexes. No sensory deficit. He exhibits normal muscle tone.  Skin: Skin is warm and dry. Capillary refill takes less than 2 seconds. He is not diaphoretic.  Psychiatric: He has a normal mood and affect. His behavior is normal.  Vitals reviewed.  Results for orders placed or performed in visit on 04/10/17 (from the past 24 hour(s))  POCT Urinalysis Dipstick (Automated)     Status: None   Collection Time: 04/10/17 11:45 AM  Result Value Ref Range   Color, UA yellow    Clarity, UA clear    Glucose, UA negative    Bilirubin, UA negative    Ketones, UA negative    Spec Grav, UA 1.025 1.010 - 1.025   Blood, UA trace-intact    pH, UA 6.0 5.0 - 8.0   Protein, UA negative    Urobilinogen, UA 0.2 0.2 or 1.0 E.U./dL   Nitrite, UA  negative    Leukocytes, UA Negative Negative   A total of 25 minutes was spent in the room with the patient, greater than 50% of which was in counseling/coordination of care.   ASSESSMENT & PLAN: Levi Phillips was seen today for flank pain.  Diagnoses and all orders for this visit:  Acute left-sided low back pain, with sciatica presence unspecified -     POCT Urinalysis Dipstick (Automated) -     HYDROcodone-acetaminophen (NORCO) 5-325 MG tablet; Take 1 tablet by mouth every 6 (six) hours as needed for moderate pain.  Acute left flank pain -     ketorolac (TORADOL) injection 60 mg -     HYDROcodone-acetaminophen (NORCO) 5-325 MG tablet; Take 1 tablet by mouth every 6 (six) hours as needed for moderate pain.  History of kidney  stones  Renal colic on left side     Patient Instructions       IF you received an x-ray today, you will receive an invoice from [facility]. Please contact [facility] at [phone] with questions or concerns regarding your invoice.   IF you received labwork today, you will receive an invoice from [facility]. Please contact [facility] at [phone] with questions or concerns regarding your invoice.   Our billing staff will not be able to assist you with questions regarding bills from these companies.  You will be contacted with the lab results as soon as they are available. The fastest way to get your results is to activate your My Chart account. Instructions are located on the last page of this paperwork. If you have not heard from [place] regarding the results in 2 weeks, please contact this office.     Kidney Stones Kidney stones (urolithiasis) are rock-like masses that form inside of the kidneys. Kidneys are organs that make pee (urine). A kidney stone can cause very bad pain and can block the flow of pee. The stone usually leaves your body (passes) through your pee. You may need to have a doctor take out the stone. Follow these  instructions at home: Eating and drinking  Drink enough fluid to keep your pee clear or pale yellow. This will help you pass the stone.  If told by your doctor, change the foods you eat (your diet). This may include: ? Limiting how much salt (sodium) you eat. ? Eating more fruits and vegetables. ? Limiting how much meat, poultry, fish, and eggs you eat.  Follow instructions from your doctor about eating or drinking restrictions. General instructions  Collect pee samples as told by your doctor. You may need to collect a pee sample: ? 24 hours after a stone comes out. ? 8-12 weeks after a stone comes out, and every 6-12 months after that.  Strain your pee every time you pee (urinate), for as long as told. Use the strainer that your doctor recommends.  Do not throw out the stone. Keep it so that it can be tested by your doctor.  Take over-the-counter and prescription medicines only as told by your doctor.  Keep all follow-up visits as told by your doctor. This is important. You may need follow-up tests. Preventing kidney stones To prevent another kidney stone:  Drink enough fluid to keep your pee clear or pale yellow. This is the best way to prevent kidney stones.  Eat healthy foods.  Avoid certain foods as told by your doctor. You may be told to eat less protein.  Stay at a healthy weight.  Contact a doctor if:  You have pain that gets worse or does not get better with medicine. Get help right away if:  You have a fever or chills.  You get very bad pain.  You get new pain in your belly (abdomen).  You pass out (faint).  You cannot pee. This information is not intended to replace advice given to you by your health care provider. Make sure you discuss any questions you have with your health care provider. Document Released: 08/15/2007 Document Revised: 11/15/2015 Document Reviewed: 11/15/2015 Elsevier Interactive Patient Education  2017 Elsevier Inc.      Edwina Barth, MD [facility]

## 2017-04-10 NOTE — Discharge Instructions (Signed)
Please follow-up with urology as instructed.  Return for any problem.  Drink plenty of fluids.  Continue taking Flomax as previously prescribed.

## 2017-04-10 NOTE — Patient Instructions (Addendum)
     IF you received an x-ray today, you will receive an invoice from [facility]. Please contact [facility] at [phone] with questions or concerns regarding your invoice.   IF you received labwork today, you will receive an invoice from [facility]. Please contact [facility] at [phone] with questions or concerns regarding your invoice.   Our billing staff will not be able to assist you with questions regarding bills from these companies.  You will be contacted with the lab results as soon as they are available. The fastest way to get your results is to activate your My Chart account. Instructions are located on the last page of this paperwork. If you have not heard from us regarding the results in 2 weeks, please contact this office.     Kidney Stones Kidney stones (urolithiasis) are rock-like masses that form inside of the kidneys. Kidneys are organs that make pee (urine). A kidney stone can cause very bad pain and can block the flow of pee. The stone usually leaves your body (passes) through your pee. You may need to have a doctor take out the stone. Follow these instructions at home: Eating and drinking  Drink enough fluid to keep your pee clear or pale yellow. This will help you pass the stone.  If told by your doctor, change the foods you eat (your diet). This may include: ? Limiting how much salt (sodium) you eat. ? Eating more fruits and vegetables. ? Limiting how much meat, poultry, fish, and eggs you eat.  Follow instructions from your doctor about eating or drinking restrictions. General instructions  Collect pee samples as told by your doctor. You may need to collect a pee sample: ? 24 hours after a stone comes out. ? 8-12 weeks after a stone comes out, and every 6-12 months after that.  Strain your pee every time you pee (urinate), for as long as told. Use the strainer that your doctor recommends.  Do not throw out the stone. Keep it so that it  can be tested by your doctor.  Take over-the-counter and prescription medicines only as told by your doctor.  Keep all follow-up visits as told by your doctor. This is important. You may need follow-up tests. Preventing kidney stones To prevent another kidney stone:  Drink enough fluid to keep your pee clear or pale yellow. This is the best way to prevent kidney stones.  Eat healthy foods.  Avoid certain foods as told by your doctor. You may be told to eat less protein.  Stay at a healthy weight.  Contact a doctor if:  You have pain that gets worse or does not get better with medicine. Get help right away if:  You have a fever or chills.  You get very bad pain.  You get new pain in your belly (abdomen).  You pass out (faint).  You cannot pee. This information is not intended to replace advice given to you by your health care provider. Make sure you discuss any questions you have with your health care provider. Document Released: 08/15/2007 Document Revised: 11/15/2015 Document Reviewed: 11/15/2015 Elsevier Interactive Patient Education  2017 Elsevier Inc.  

## 2017-04-12 DIAGNOSIS — N200 Calculus of kidney: Secondary | ICD-10-CM | POA: Diagnosis not present

## 2017-04-12 DIAGNOSIS — N202 Calculus of kidney with calculus of ureter: Secondary | ICD-10-CM | POA: Diagnosis not present

## 2017-05-02 DIAGNOSIS — N202 Calculus of kidney with calculus of ureter: Secondary | ICD-10-CM | POA: Diagnosis not present

## 2017-05-09 ENCOUNTER — Encounter: Payer: Self-pay | Admitting: Physician Assistant

## 2017-05-09 ENCOUNTER — Telehealth: Payer: Self-pay | Admitting: Physician Assistant

## 2017-05-09 ENCOUNTER — Other Ambulatory Visit: Payer: Self-pay

## 2017-05-09 ENCOUNTER — Ambulatory Visit (INDEPENDENT_AMBULATORY_CARE_PROVIDER_SITE_OTHER): Payer: Medicare Other | Admitting: Physician Assistant

## 2017-05-09 DIAGNOSIS — M25552 Pain in left hip: Secondary | ICD-10-CM | POA: Diagnosis not present

## 2017-05-09 DIAGNOSIS — M7660 Achilles tendinitis, unspecified leg: Secondary | ICD-10-CM | POA: Diagnosis not present

## 2017-05-09 DIAGNOSIS — N23 Unspecified renal colic: Secondary | ICD-10-CM | POA: Diagnosis not present

## 2017-05-09 DIAGNOSIS — M5136 Other intervertebral disc degeneration, lumbar region: Secondary | ICD-10-CM

## 2017-05-09 DIAGNOSIS — Z87898 Personal history of other specified conditions: Secondary | ICD-10-CM | POA: Insufficient documentation

## 2017-05-09 MED ORDER — ONDANSETRON HCL 4 MG PO TABS: 4.0000 mg | ORAL_TABLET | Freq: Four times a day (QID) | ORAL | 0 refills | Status: DC

## 2017-05-09 MED ORDER — MECLIZINE HCL 25 MG PO TABS: 25.0000 mg | ORAL_TABLET | Freq: Three times a day (TID) | ORAL | 0 refills | Status: DC | PRN

## 2017-05-09 NOTE — Assessment & Plan Note (Signed)
Asymptomatic presently, but wants to be prepared if symptoms recur.

## 2017-05-09 NOTE — Progress Notes (Signed)
Patient ID: Levi Phillips, male    DOB: [birth year], 77 y.o.   MRN: [id number]  PCP: Patient, No Pcp Per  Chief Complaint  Patient presents with  . Nephrolithiasis    seen in ED    Subjective:   Presents for evaluation following recent ED visit with flank pain.  He presented here on 04/10/2017 with LEFT flank pain. He saw one my colleagues, who diagnosed renal colic, provided pain relief with IM Toradol and Rx hydrocodone. The patient was advised to go to the emergency department if the pain worsened, which it did. Evaluation in the ED revealed elevated serum creatinine (1.35) and decreased GFR (49). CT scan revealed a 5 mm LEFT UVJ stone. He received IV fluids and pain relief, and was discharged home to follow-up with urology.  He has not experienced recurrent pain. No hematuria, fever, chills or changes in urinary symptoms. To call in June for visit in 10/2017 Dr. McDiarmid.  Notes that for the past 3 days he has experienced pain in the LEFT hip and heel area. He stopped his daily exercise (home stretching and walking, [place] [employer] Isn't sure what he can do to alleviate the symptoms. Has not taken anything for pain.  Requests refills of ondansetron and meclizine for PRN use if he develops recurrent dizziness. He had an episode of vertigo in 05/2016, for which he was seen in the ED. He wants to make sure that he has a supply of meclizine and ondansetron should the symptoms recur, especially during planned travel in the next several months. He also wants to make sure that it's ok to take the oxycodone-APAP 5/325 if his back pain worsens while traveling. He has known DDD of the lumbar spine.  Review of Systems As above.    Patient Active Problem List   Diagnosis Date Noted  . Acute left-sided low back pain 04/10/2017  . Acute left flank pain 04/10/2017  . History of kidney stones 04/10/2017  . 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 DDD (degenerative disc disease),  lumbar 01/09/2017  . Gout 01/09/2017  . BPH (benign prostatic hyperplasia) 01/09/2017  . BMI 27.0-27.9,adult 01/09/2017     Prior to Admission medications   Medication Sig Start Date End Date Taking? Authorizing Provider  allopurinol (ZYLOPRIM) 100 MG tablet Take 100 mg by mouth daily.   Yes [provider]  oxyCODONE-acetaminophen (PERCOCET/ROXICET) 5-325 MG tablet Take 2 tablets by mouth every 4 (four) hours as needed for severe pain. 04/10/17  Yes Wynetta FinesMessick, Peter C, MD  allopurinol (ZYLOPRIM) 100 MG tablet Take 100 mg by mouth daily.    [provider]  HYDROcodone-acetaminophen (NORCO) 5-325 MG tablet Take 1 tablet by mouth every 6 (six) hours as needed for moderate pain. Patient not taking: Reported on 05/09/2017 04/10/17   Georgina QuintSagardia, Miguel Jose, MD  naproxen (NAPROSYN) 250 MG tablet Take 500 mg by mouth 2 (two) times daily as needed for moderate pain.    [provider]  ondansetron (ZOFRAN) 4 MG tablet Take 1 tablet (4 mg total) by mouth every 6 (six) hours. Patient not taking: Reported on 05/09/2017 04/10/17   Wynetta FinesMessick, Peter C, MD  tamsulosin (FLOMAX) 0.4 MG CAPS capsule Take 0.4 mg by mouth daily.    [provider]  tamsulosin (FLOMAX) 0.4 MG CAPS capsule Take 0.4 mg by mouth.    [provider]     No Known Allergies     Objective:  Physical Exam  Constitutional: He  is oriented to person, place, and time. He appears well-developed and well-nourished. He is active and cooperative. No distress.  There were no vitals taken for this visit.  HENT:  Head: Normocephalic and atraumatic.  Right Ear: Hearing normal.  Left Ear: Hearing normal.  Eyes: Conjunctivae are normal. No scleral icterus.  Neck: Normal range of motion. Neck supple. No thyromegaly present.  Cardiovascular: Normal rate and regular rhythm.  Murmur heard.  Systolic (very soft) murmur is present. Pulses:      Radial pulses are 2+ on the right side, and 2+ on the left  side.  Pulmonary/Chest: Effort normal and breath sounds normal.  Musculoskeletal:       Left hip: He exhibits tenderness (trochanteric bursa). He exhibits normal range of motion, normal strength, no swelling and no crepitus.       Lumbar back: He exhibits bony tenderness and pain. He exhibits normal range of motion, no tenderness, no swelling, no edema, no deformity, no laceration, no spasm and normal pulse.  Lymphadenopathy:       Head (right side): No tonsillar, no preauricular, no posterior auricular and no occipital adenopathy present.       Head (left side): No tonsillar, no preauricular, no posterior auricular and no occipital adenopathy present.    He has no cervical adenopathy.       Right: No supraclavicular adenopathy present.       Left: No supraclavicular adenopathy present.  Neurological: He is alert and oriented to person, place, and time. No sensory deficit.  Skin: Skin is warm, dry and intact. No rash noted. No cyanosis or erythema. Nails show no clubbing.  Psychiatric: He has a normal mood and affect. His speech is normal and behavior is normal.       Assessment & Plan:   Problem List Items Addressed This Visit    DDD (degenerative disc disease), lumbar    Advised that he can use acetaminophen PRN LBP. If pain becomes severe, OK to use oxycodone. This could cause pain into the hip, but on today's exam the hip pain appears to be bursitis.      Renal colic on left side - Primary   Relevant Orders   Comprehensive metabolic panel   History of vertigo    Asymptomatic presently, but wants to be prepared if symptoms recur.      Relevant Medications   ondansetron (ZOFRAN) 4 MG tablet   meclizine (ANTIVERT) 25 MG tablet    Other Visit Diagnoses    Left hip pain       Suspect bursitis. Ice massage. Acetaminophen. If worsens, would update radiographs.   Achilles tendon pain       Ice, Stretching. Acetaminophen PRN.       Return in about 6 months (around  11/06/2017). He declined Tdap and pneumococcal vaccines today, telling me that his long-time friend is a physician in [place], who advises him on such matters. Recommend that he inquire regarding his friend's recommendation on these vaccines specifically.  Fernande Bras, PA-C Primary Care at [facility]

## 2017-05-09 NOTE — Assessment & Plan Note (Addendum)
Advised that he can use acetaminophen PRN LBP. If pain becomes severe, OK to use oxycodone. This could cause pain into the hip, but on today's exam the hip pain appears to be bursitis.

## 2017-05-09 NOTE — Patient Instructions (Addendum)
You may resume your exercise. Apply ice pack to the places that hurt for 15-20 minutes after your exercise. Use acetaminophen for the pain as needed.  Ask your doctor friend about getting the Tdap and pneumococcal vaccines. We recommend them, and would like to adminster them to help you stay healthy.  IF you received an x-ray today, you will receive an invoice from [facility]. Please contact [facility] at [phone] with questions or concerns regarding your invoice.   IF you received labwork today, you will receive an invoice from [facility]. Please contact [facility] at [phone] with questions or concerns regarding your invoice.   Our billing staff will not be able to assist you with questions regarding bills from these companies.  You will be contacted with the lab results as soon as they are available. The fastest way to get your results is to activate your My Chart account. Instructions are located on the last page of this paperwork. If you have not heard from [place] regarding the results in 2 weeks, please contact this office.      Achilles Tendinitis Achilles tendinitis is inflammation of the tough, cord-like band that attaches the lower leg muscles to the heel bone (Achilles tendon). This is usually caused by overusing the tendon and the ankle joint. Achilles tendinitis usually gets better over time with treatment and caring for yourself at home. It can take weeks or months to heal completely. What are the causes? This condition may be caused by:  A sudden increase in exercise or activity, such as running.  Doing the same exercises or activities (such as jumping) over and over.  Not warming up calf muscles before exercising.  Exercising in shoes that are worn out or not made for exercise.  Having arthritis or a bone growth (spur) on the back of the heel bone. This can rub against the tendon and hurt it.  Age-related wear and tear. Tendons become less flexible  with age and more likely to be injured.  What are the signs or symptoms? Common symptoms of this condition include:  Pain in the Achilles tendon or in the back of the leg, just above the heel. The pain usually gets worse with exercise.  Stiffness or soreness in the back of the leg, especially in the morning.  Swelling of the skin over the Achilles tendon.  Thickening of the tendon.  Bone spurs at the bottom of the Achilles tendon, near the heel.  Trouble standing on tiptoe.  How is this diagnosed? This condition is diagnosed based on your symptoms and a physical exam. You may have tests, including:  X-rays.  MRI.  How is this treated? The goal of treatment is to relieve symptoms and help your injury heal. Treatment may include:  Decreasing or stopping activities that caused the tendinitis. This may mean switching to low-impact exercises like biking or swimming.  Icing the injured area.  Doing physical therapy, including strengthening and stretching exercises.  NSAIDs to help relieve pain and swelling.  Using supportive shoes, wraps, heel lifts, or a walking boot (air cast).  Surgery. This may be done if your symptoms do not improve after 6 months.  Using high-energy shock wave impulses to stimulate the healing process (extracorporeal shock wave therapy). This is rare.  Injection of medicines to help relieve inflammation (corticosteroids). This is rare.  Follow these instructions at home: If you have an air cast:  Wear the cast as told by your health care provider. Remove it only as told  by your health care provider.  Loosen the cast if your toes tingle, become numb, or turn cold and blue. Activity  Gradually return to your normal activities once your health care provider approves. Do not do activities that cause pain. ? Consider doing low-impact exercises, like cycling or swimming.  If you have an air cast, ask your health care provider when it is safe for you to  drive.  If physical therapy was prescribed, do exercises as told by your health care provider or physical therapist. Managing pain, stiffness, and swelling  Raise (elevate) your foot above the level of your heart while you are sitting or lying down.  Move your toes often to avoid stiffness and to lessen swelling.  If directed, put ice on the injured area: ? Put ice in a plastic bag. ? Place a towel between your skin and the bag. ? Leave the ice on for 20 minutes, 2-3 times a day General instructions  If directed, wrap your foot with an elastic bandage or other wrap. This can help keep your tendon from moving too much while it heals. Your health care provider will show you how to wrap your foot correctly.  Wear supportive shoes or heel lifts only as told by your health care provider.  Take over-the-counter and prescription medicines only as told by your health care provider.  Keep all follow-up visits as told by your health care provider. This is important. Contact a health care provider if:  You have symptoms that gets worse.  You have pain that does not get better with medicine.  You develop new, unexplained symptoms.  You develop warmth and swelling in your foot.  You have a fever. Get help right away if:  You have a sudden popping sound or sensation in your Achilles tendon followed by severe pain.  You cannot move your toes or foot.  You cannot put any weight on your foot. Summary  Achilles tendinitis is inflammation of the tough, cord-like band that attaches the lower leg muscles to the heel bone (Achilles tendon).  This condition is usually caused by overusing the tendon and the ankle joint. It can also be caused by arthritis or normal aging.  The most common symptoms of this condition include pain, swelling, or stiffness in the Achilles tendon or in the back of the leg.  This condition is usually treated with rest, NSAIDs, and physical therapy. This information  is not intended to replace advice given to you by your health care provider. Make sure you discuss any questions you have with your health care provider. Document Released: 12/06/2004 Document Revised: 01/16/2016 Document Reviewed: 01/16/2016 Elsevier Interactive Patient Education  2017 [employer]  Achilles Tendinitis Rehab Ask your health care provider which exercises are safe for you. Do exercises exactly as told by your health care provider and adjust them as directed. It is normal to feel mild stretching, pulling, tightness, or discomfort as you do these exercises, but you should stop right away if you feel sudden pain or your pain gets worse. Do not begin these exercises until told by your health care provider. Stretching and range of motion exercises These exercises warm up your muscles and joints and improve the movement and flexibility of your ankle. These exercises also help to relieve pain, numbness, and tingling. Exercise A: Standing wall calf stretch, knee straight  1. Stand with your hands against a wall. 2. Extend your __________ leg behind you and bend your front knee slightly. Keep both of  your heels on the floor. 3. Point the toes of your back foot slightly inward. 4. Keeping your heels on the floor and your back knee straight, shift your weight toward the wall. Do not allow your back to arch. You should feel a gentle stretch in your calf. 5. Hold this position for seconds. Repeat _____5-10_____ times. Complete this stretch ____1-2______ times per day. Exercise B: Standing wall calf stretch, knee bent 1. Stand with your hands against a wall. 2. Extend your __________ leg behind you, and bend your front knee slightly. Keep both of your heels on the floor. 3. Point the toes of your back foot slightly inward. 4. Keeping your heels on the floor, unlock your back knee so that it is bent. You should feel a gentle stretch deep in your calf. 5. Hold this position for _____5-10_____  seconds. Repeat ____5-10______ times. Complete this stretch ____1-2______ times per day. Strengthening exercises These exercises build strength and control of your ankle. Endurance is the ability to use your muscles for a long time, even after they get tired. Exercise C: Plantar flexion with band  1. Sit on the floor with your __________ leg extended. You may put a pillow under your calf to give your foot more room to move. 2. Loop a rubber exercise band or tube around the ball of your __________ foot. The ball of your foot is on the walking surface, right under your toes. The band or tube should be slightly tense when your foot is relaxed. If the band or tube slips, you can put on your shoe or put a washcloth between the band and your foot to help it stay in place. 3. Slowly point your toes downward, pushing them away from you. 4. Hold this position for __________ seconds. 5. Slowly release the tension in the band or tube, controlling smoothly until your foot is back to the starting position. Repeat __________ times. Complete this exercise __________ times per day. Exercise D: Heel raise with eccentric lower  1. Stand on a step with the balls of your feet. The ball of your foot is on the walking surface, right under your toes. ? Do not put your heels on the step. ? For balance, rest your hands on the wall or on a railing. 2. Rise up onto the balls of your feet. 3. Keeping your heels up, shift all of your weight to your __________ leg and pick up your other leg. 4. Slowly lower your __________ leg so your heel drops below the level of the step. 5. Put down your foot. If told by your health care provider, build up to:  3 sets of 15 repetitions while keeping your knees straight.  3 sets of 15 repetitions while keeping your knees bent as far as told by your health care provider.  Complete this exercise _____1-2_____ times per day. If this exercise is too easy, try doing it while wearing a  backpack with weights in it. Balance exercises These exercises improve or maintain your balance. Balance is important in preventing falls. Exercise E: Single leg stand 1. Without shoes, stand near a railing or in a door frame. Hold on to the railing or door frame as needed. 2. Stand on your __________ foot. Keep your big toe down on the floor and try to keep your arch lifted. 3. Hold this position for ____5-10______ seconds. Repeat ____5-10______ times. Complete this exercise _____1-2_____ times per day. If this exercise is too easy, you can try it with your  eyes closed or while standing on a pillow. This information is not intended to replace advice given to you by your health care provider. Make sure you discuss any questions you have with your health care provider. Document Released: 09/27/2004 Document Revised: 11/03/2015 Document Reviewed: 11/02/2014 Elsevier Interactive Patient Education  [employer].

## 2017-05-09 NOTE — Telephone Encounter (Signed)
Copied from CRM [phone]. Topic: Quick Communication - Rx Refill/Question >> May 09, 2017  4:45 PM Stephannie LiSimmons, Voncile Schwarz L, NT wrote: Medication:  meclizine (ANTIVERT) 25 MG tablet, ondansetron (ZOFRAN) 4 MG tablet  Has the patient contacted their pharmacy? yes  (Agent: If no, request that the patient contact the pharmacy for the refill. Preferred Pharmacy (with phone number or street name): [facility] [address] - [place], [place] - [address] [phone] (Phone) [phone] (Fax)   Agent: Please be advised that RX refills may take up to 3 business days. We ask that you follow-up with your pharmacy.

## 2017-05-10 LAB — COMPREHENSIVE METABOLIC PANEL
A/G RATIO: 1.9 (ref 1.2–2.2)
ALT: 15 IU/L (ref 0–44)
AST: 22 IU/L (ref 0–40)
Albumin: 5 g/dL — ABNORMAL HIGH (ref 3.5–4.8)
Alkaline Phosphatase: 65 IU/L (ref 39–117)
BILIRUBIN TOTAL: 0.5 mg/dL (ref 0.0–1.2)
BUN/Creatinine Ratio: 16 (ref 10–24)
BUN: 16 mg/dL (ref 8–27)
CALCIUM: 9.8 mg/dL (ref 8.6–10.2)
CHLORIDE: 102 mmol/L (ref 96–106)
CO2: 19 mmol/L — ABNORMAL LOW (ref 20–29)
Creatinine, Ser: 1.02 mg/dL (ref 0.76–1.27)
GFR, EST AFRICAN AMERICAN: 82 mL/min/{1.73_m2} (ref >59)
GFR, EST NON AFRICAN AMERICAN: 71 mL/min/{1.73_m2} (ref >59)
GLOBULIN, TOTAL: 2.6 g/dL (ref 1.5–4.5)
Glucose: 93 mg/dL (ref 65–99)
POTASSIUM: 4.3 mmol/L (ref 3.5–5.2)
Sodium: 139 mmol/L (ref 134–144)
TOTAL PROTEIN: 7.6 g/dL (ref 6.0–8.5)

## 2017-05-10 MED ORDER — MECLIZINE HCL 25 MG PO TABS: 25.0000 mg | ORAL_TABLET | Freq: Three times a day (TID) | ORAL | 0 refills | Status: DC | PRN

## 2017-05-10 MED ORDER — ONDANSETRON HCL 4 MG PO TABS: 4.0000 mg | ORAL_TABLET | Freq: Four times a day (QID) | ORAL | 0 refills | Status: DC

## 2017-05-12 ENCOUNTER — Encounter: Payer: Self-pay | Admitting: Physician Assistant

## 2017-06-17 ENCOUNTER — Encounter: Payer: Self-pay | Admitting: Physician Assistant

## 2017-07-04 ENCOUNTER — Telehealth: Payer: Self-pay | Admitting: Physician Assistant

## 2017-07-04 NOTE — Telephone Encounter (Signed)
Called pt to try and inform them of Chelle's leaving the practice. No answer and the mailbox was full so no message was left. If pt calls back, please reschedule them with one of our providers accepting new pts. : Levi Phillips, Stallings, Lurline IdolSagardia, Santiago, Wiseman, McVey or OvandoMani.  Thanks!

## 2017-07-11 ENCOUNTER — Encounter: Payer: Self-pay | Admitting: *Deleted

## 2017-10-15 ENCOUNTER — Ambulatory Visit (INDEPENDENT_AMBULATORY_CARE_PROVIDER_SITE_OTHER): Payer: Medicare Other | Admitting: Emergency Medicine

## 2017-10-15 ENCOUNTER — Encounter: Payer: Self-pay | Admitting: Emergency Medicine

## 2017-10-15 ENCOUNTER — Other Ambulatory Visit: Payer: Self-pay

## 2017-10-15 VITALS — BP 128/71 | HR 79 | Temp 98.3°F | Resp 16 | Ht 68.0 in | Wt 177.6 lb

## 2017-10-15 DIAGNOSIS — M48061 Spinal stenosis, lumbar region without neurogenic claudication: Secondary | ICD-10-CM | POA: Diagnosis not present

## 2017-10-15 DIAGNOSIS — G8929 Other chronic pain: Secondary | ICD-10-CM | POA: Diagnosis not present

## 2017-10-15 DIAGNOSIS — M5442 Lumbago with sciatica, left side: Secondary | ICD-10-CM | POA: Diagnosis not present

## 2017-10-15 DIAGNOSIS — M5136 Other intervertebral disc degeneration, lumbar region: Secondary | ICD-10-CM | POA: Diagnosis not present

## 2017-10-15 NOTE — Progress Notes (Signed)
Levi Phillips 77 y.o.   Chief Complaint  Patient presents with  . Sciatica    LEFT  pain per patient x 1 year    HISTORY OF PRESENT ILLNESS: 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  Requesting referral to orthopedics and/or physical therapy.  Does not want pain medication.  No other significant symptoms. MRI of the lumbar spine done on 02/28/2016 shows the following: IMPRESSION: 1. Large right foraminal/extraforaminal L4-L5 disc extrusion with superior migration, causing impingement of the exiting right L4 nerve root. There is also moderate to severe spinal canal stenosis at this level due to combination of disc disease and severe facet arthrosis. 2. Medium-sized left subarticular L5-S1 disc extrusion with inferior migration narrowing the left lateral recess and displacing the descending left S1 nerve root. Correlate for left S1 radiculopathy. 3. 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  HPI   Prior to Admission medications   Medication Sig Start Date End Date Taking? Authorizing Provider  allopurinol (ZYLOPRIM) 100 MG tablet Take 100 mg by mouth daily.   Yes [provider]  meclizine (ANTIVERT) 25 MG tablet Take 1 tablet (25 mg total) by mouth 3 (three) times daily as needed for dizziness. Patient not taking: Reported on 10/15/2017 05/10/17   Porfirio OarJeffery, Chelle, PA-C  ondansetron (ZOFRAN) 4 MG tablet Take 1 tablet (4 mg total) by mouth every 6 (six) hours. Patient not taking: Reported on 10/15/2017 05/10/17   Porfirio OarJeffery, Chelle, PA-C  oxyCODONE-acetaminophen (PERCOCET/ROXICET) 5-325 MG tablet Take 2 tablets by mouth every 4 (four) hours as needed for severe pain. Patient not taking: Reported on 10/15/2017 04/10/17   Wynetta FinesMessick, Peter C, MD      No Known Allergies  Patient Active Problem List   Diagnosis Date Noted  . Spinal stenosis of lumbar region 10/15/2017  . Chronic bilateral low back pain with left-sided sciatica 10/15/2017  . History of vertigo 05/09/2017  . History of kidney stones 04/10/2017  . 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 DDD (degenerative disc disease), lumbar 01/09/2017  . Gout 01/09/2017  . BPH (benign prostatic hyperplasia) 01/09/2017  . BMI 27.0-27.9,adult 01/09/2017    Past Medical History:  Diagnosis Date  . Allergy   . Anemia   . Arthritis   . Back pain    spondylolisthesis  . Enlarged prostate    takes Flomax daily  . Gout    takes Allopurinol daily    Past Surgical History:  Procedure Laterality Date  . cataract surgery Bilateral   . EYE SURGERY    . [place]     [place]    Social History   Socioeconomic History  . Marital status: Married    Spouse name: Not on file  . Number of children: Not on file  . Years of education: Not on file  . Highest education level: Not on file  Occupational History  . Not on file  Social Needs  . Financial resource strain: Not on file  . Food insecurity:    Worry: Not on file    Inability: Not on file  . Transportation needs:    Medical: Not on file    Non-medical: Not on file  Tobacco Use  . Smoking status: Never Smoker  . Smokeless tobacco:  Never Used  Substance and Sexual Activity  . Alcohol use: Yes    Comment: occ beer  . Drug use: No  . Sexual activity: Not on file  Lifestyle  . Physical activity:    Days per week: Not on file    Minutes per session: Not on file  . Stress: Not on file  Relationships  . Social connections:    Talks on phone: Not on file    Gets together: Not on file    Attends religious service: Not on file    Active member of club or organization: Not on file    Attends meetings of clubs or organizations: Not on file    Relationship status: Not on file  . Intimate partner violence:     Fear of current or ex partner: Not on file    Emotionally abused: Not on file    Physically abused: Not on file    Forced sexual activity: Not on file  Other Topics Concern  . Not on file  Social History Narrative   ** Merged History Encounter **        Family History  Problem Relation Age of Onset  . Cancer Mother   . Alzheimer's disease Sister   . Alzheimer's disease Sister      Review of Systems  Constitutional: Negative.  Negative for chills, fever and weight loss.  HENT: Negative.   Eyes: Negative.   Respiratory: Negative.  Negative for cough and shortness of breath.   Cardiovascular: Negative.  Negative for chest pain and palpitations.  Gastrointestinal: Negative.  Negative for abdominal pain, blood in stool, diarrhea, melena, nausea and vomiting.  Genitourinary: Negative.  Negative for dysuria, flank pain and hematuria.  Musculoskeletal: Positive for back pain.  Skin: Negative.  Negative for rash.  Neurological: Negative.  Negative for dizziness, sensory change, focal weakness and headaches.  Endo/Heme/Allergies: Negative.   All other systems reviewed and are negative.  Vitals:   10/15/17 1155  BP: 128/71  Pulse: 79  Resp: 16  Temp: 98.3 F (36.8 C)  SpO2: 95%     Physical Exam  Constitutional: He is oriented to person, place, and time. He appears well-developed and well-nourished.  HENT:  Head: Normocephalic and atraumatic.  Mouth/Throat: Oropharynx is clear and moist.  Eyes: Pupils are equal, round, and reactive to light. Conjunctivae are normal.  Neck: Normal range of motion. Neck supple.  Cardiovascular: Normal rate and regular rhythm.  Pulmonary/Chest: Effort normal and breath sounds normal.  Abdominal: Soft. There is no tenderness.  Musculoskeletal:       Lumbar back: He exhibits decreased range of motion and tenderness. He exhibits no bony tenderness, no spasm and normal pulse.  Neurological: He is alert and oriented to person, place, and  time. He displays normal reflexes. No sensory deficit. He exhibits normal muscle tone.  Skin: Skin is warm and dry. Capillary refill takes less than 2 seconds.  Psychiatric: He has a normal mood and affect. His behavior is normal.  Vitals reviewed.   A total of 25 minutes was spent in the room with the patient, greater than 50% of which was in counseling/coordination of care regarding differential diagnosis, treatment, medications, orthopedic referral, physical therapy options, and need for follow-up.  ASSESSMENT & PLAN: Luisdaniel was seen today for sciatica.  Diagnoses and all orders for this visit:  DDD (degenerative disc disease), lumbar -     Ambulatory referral to Orthopedic Surgery  Spinal stenosis of lumbar region, unspecified whether neurogenic claudication  present -     Ambulatory referral to Orthopedic Surgery  Chronic bilateral low back pain with left-sided sciatica    Patient Instructions       IF you received an x-ray today, you will receive an invoice from [facility]. Please contact [facility] at [phone] with questions or concerns regarding your invoice.   IF you received labwork today, you will receive an invoice from [facility]. Please contact [facility] at [phone] with questions or concerns regarding your invoice.   Our billing staff will not be able to assist you with questions regarding bills from these companies.  You will be contacted with the lab results as soon as they are available. The fastest way to get your results is to activate your My Chart account. Instructions are located on the last page of this paperwork. If you have not heard from [place] regarding the results in 2 weeks, please contact this office.     Back Pain, Adult Back pain is very common. The pain often gets better over time. The cause of back pain is usually not dangerous. Most people can learn to manage their back pain on their own. Follow these instructions at  home: Watch your back pain for any changes. The following actions may help to lessen any pain you are feeling:  Stay active. Start with short walks on flat ground if you can. Try to walk farther each day.  Exercise regularly as told by your doctor. Exercise helps your back heal faster. It also helps avoid future injury by keeping your muscles strong and flexible.  Do not sit, drive, or stand in one place for more than 30 minutes.  Do not stay in bed. Resting more than 1-2 days can slow down your recovery.  Be careful when you bend or lift an object. Use good form when lifting: ? Bend at your knees. ? Keep the object close to your body. ? Do not twist.  Sleep on a firm mattress. Lie on your side, and bend your knees. If you lie on your back, put a pillow under your knees.  Take medicines only as told by your doctor.  Put ice on the injured area. ? Put ice in a plastic bag. ? Place a towel between your skin and the bag. ? Leave the ice on for 20 minutes, 2-3 times a day for the first 2-3 days. After that, you can switch between ice and heat packs.  Avoid feeling anxious or stressed. Find good ways to deal with stress, such as exercise.  Maintain a healthy weight. Extra weight puts stress on your back.  Contact a doctor if:  You have pain that does not go away with rest or medicine.  You have worsening pain that goes down into your legs or buttocks.  You have pain that does not get better in one week.  You have pain at night.  You lose weight.  You have a fever or chills. Get help right away if:  You cannot control when you poop (bowel movement) or pee (urinate).  Your arms or legs feel weak.  Your arms or legs lose feeling (numbness).  You feel sick to your stomach (nauseous) or throw up (vomit).  You have belly (abdominal) pain.  You feel like you may pass out (faint). This information is not intended to replace advice given to you by your health care provider.  Make sure you discuss any questions you have with your health care provider. Document Released: 08/15/2007 Document  Revised: 08/04/2015 Document Reviewed: 06/30/2013 Elsevier Interactive Patient Education  2018 [employer].      Edwina Barth, MD [facility]

## 2017-10-15 NOTE — Patient Instructions (Addendum)
     IF you received an x-ray today, you will receive an invoice from [facility]. Please contact [facility] at [phone] with questions or concerns regarding your invoice.   IF you received labwork today, you will receive an invoice from [facility]. Please contact [facility] at [phone] with questions or concerns regarding your invoice.   Our billing staff will not be able to assist you with questions regarding bills from these companies.  You will be contacted with the lab results as soon as they are available. The fastest way to get your results is to activate your My Chart account. Instructions are located on the last page of this paperwork. If you have not heard from us regarding the results in 2 weeks, please contact this office.      Back Pain, Adult Back pain is very common. The pain often gets better over time. The cause of back pain is usually not dangerous. Most people can learn to manage their back pain on their own. Follow these instructions at home: Watch your back pain for any changes. The following actions may help to lessen any pain you are feeling:  Stay active. Start with short walks on flat ground if you can. Try to walk farther each day.  Exercise regularly as told by your doctor. Exercise helps your back heal faster. It also helps avoid future injury by keeping your muscles strong and flexible.  Do not sit, drive, or stand in one place for more than 30 minutes.  Do not stay in bed. Resting more than 1-2 days can slow down your recovery.  Be careful when you bend or lift an object. Use good form when lifting: ? Bend at your knees. ? Keep the object close to your body. ? Do not twist.  Sleep on a firm mattress. Lie on your side, and bend your knees. If you lie on your back, put a pillow under your knees.  Take medicines only as told by your doctor.  Put ice on the injured area. ? Put ice in a plastic bag. ? Place a towel between your  skin and the bag. ? Leave the ice on for 20 minutes, 2-3 times a day for the first 2-3 days. After that, you can switch between ice and heat packs.  Avoid feeling anxious or stressed. Find good ways to deal with stress, such as exercise.  Maintain a healthy weight. Extra weight puts stress on your back.  Contact a doctor if:  You have pain that does not go away with rest or medicine.  You have worsening pain that goes down into your legs or buttocks.  You have pain that does not get better in one week.  You have pain at night.  You lose weight.  You have a fever or chills. Get help right away if:  You cannot control when you poop (bowel movement) or pee (urinate).  Your arms or legs feel weak.  Your arms or legs lose feeling (numbness).  You feel sick to your stomach (nauseous) or throw up (vomit).  You have belly (abdominal) pain.  You feel like you may pass out (faint). This information is not intended to replace advice given to you by your health care provider. Make sure you discuss any questions you have with your health care provider. Document Released: 08/15/2007 Document Revised: 08/04/2015 Document Reviewed: 06/30/2013 Elsevier Interactive Patient Education  2018 Elsevier Inc.  

## 2017-11-04 ENCOUNTER — Encounter (INDEPENDENT_AMBULATORY_CARE_PROVIDER_SITE_OTHER): Payer: Self-pay | Admitting: Orthopaedic Surgery

## 2017-11-04 ENCOUNTER — Ambulatory Visit (INDEPENDENT_AMBULATORY_CARE_PROVIDER_SITE_OTHER): Payer: Medicare Other

## 2017-11-04 ENCOUNTER — Ambulatory Visit (INDEPENDENT_AMBULATORY_CARE_PROVIDER_SITE_OTHER): Payer: Medicare Other | Admitting: Orthopaedic Surgery

## 2017-11-04 VITALS — BP 131/82 | HR 75 | Ht 68.0 in | Wt 178.0 lb

## 2017-11-04 DIAGNOSIS — G8929 Other chronic pain: Secondary | ICD-10-CM | POA: Diagnosis not present

## 2017-11-04 DIAGNOSIS — M5442 Lumbago with sciatica, left side: Secondary | ICD-10-CM | POA: Diagnosis not present

## 2017-11-04 NOTE — Progress Notes (Signed)
Office Visit Note   Patient: Levi Phillips           Date of Birth: Jul 30, 1940           MRN: [id number] Visit Date: 11/04/2017              Requested by: Levi Quint, MD [address] [place], [place] [address] PCP: Levi Quint, MD   Assessment & Plan: Visit Diagnoses:  1. Chronic midline low back pain with left-sided sciatica     Plan: Mild low back pain with left-sided radicular discomfort.  Per prior MRI scan probably has spinal claudication related to facet changes and stenosis.  We will try cortisone injection and monitor response.  Long discussion regarding diagnosis and treatment.  Time spent over 45 minutes with over 50% of the time in counseling guarding treatment options.  He would like to try her cortisone injection  Follow-Up Instructions: Return in about 1 month (around 12/05/2017).   Orders:  Orders Placed This Encounter  Procedures  . XR Lumbar Spine 2-3 Views  . Epidural Steroid Injection - Lumbar/Sacral (Ancillary Performed)   No orders of the defined types were placed in this encounter.     Procedures: No procedures performed   Clinical Data: No additional findings.   Subjective: Chief Complaint  Patient presents with  . New Patient (Initial Visit)    LBP FOR 3-4 MO RADIATES DOWN BACK OF LEG SEN DR 1 YR AGO AND TOLD HIM HE NEEDED SURGERY. DOSENT REMEMBER DR  77 year old gentleman visited the office for evaluation of minimal low back pain associated with left lower extremity radicular discomfort.Levi Phillips had an MRI scan of his lumbar spine in December 2017 he had a large right foraminal disc extrusion at L4-5 causing impingement of the exiting right L4 nerve root associated with mild to severe spinal canal stenosis.  There was a medium size left subarticular L5-S1 disc extrusion with inferior migration narrowing the left lateral recess and displacing the descending left S1 nerve root.  There was mild to moderate upper lumbar  degenerative disc disease.  Apparently surgery was recommended but many of his symptoms abated with exercises and time.  Since then he is been working out at the gym daily for at least 30 to 60 minutes.  His only problem now is some referred pain into his left lower extremity when he stands or first walks.  The pain radiates from his left buttock into his left thigh.  Rarely does he have any pain distal to the knee.  He is not having any acute back pain.  He is not experiencing any problem on the right.  He really has not tried any medicine as he is concerned about the side effects .denies any bowel or bladder changes HPI  Review of Systems  Constitutional: Negative for fatigue and fever.  HENT: Negative for ear pain.   Eyes: Negative for pain.  Respiratory: Negative for cough and shortness of breath.   Cardiovascular: Negative for leg swelling.  Gastrointestinal: Negative for constipation and diarrhea.  Genitourinary: Negative for difficulty urinating.  Musculoskeletal: Positive for back pain. Negative for neck pain.  Skin: Negative for rash.  Allergic/Immunologic: Negative for food allergies.  Neurological: Positive for weakness and numbness.  Hematological: Does not bruise/bleed easily.  Psychiatric/Behavioral: Positive for sleep disturbance.     Objective: Vital Signs: BP 131/82 (BP Location: Left Arm, Patient Position: Sitting, Cuff Size: Normal)   Pulse 75   Ht 5\' 8"  (1.727 m)  Wt 178 lb (80.7 kg)   BMI 27.06 kg/m   Physical Exam  Constitutional: He is oriented to person, place, and time. He appears well-developed and well-nourished.  HENT:  Mouth/Throat: Oropharynx is clear and moist.  Eyes: Pupils are equal, round, and reactive to light. EOM are normal.  Pulmonary/Chest: Effort normal.  Neurological: He is alert and oriented to person, place, and time.  Skin: Skin is warm and dry.  Psychiatric: He has a normal mood and affect. His behavior is normal.    Ortho Exam  language was issue.  Awake alert and oriented x3.  Comfortable sitting.  No pain with ambulation.  Painless range of motion of both hips.  Right knee reflex and left ankle reflex decreased.  Motor exam intact.  Sensory exam intact.  Painless range of motion both knees.  No percussible tenderness of the lumbar spine.  Hamstring slightly tight more so left than the right.  No obvious atrophy of either lower extremity.  Specialty Comments:  No specialty comments available.  Imaging: Xr Lumbar Spine 2-3 Views  Result Date: 11/04/2017 Films of the lumbar spine obtained in 2 projections.  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  Sacroiliac joints appear to be intact.  On the lateral film there is abundant bowel gas but and some calcification of the abdominal aorta.  No obvious aneurysmal dilatation.  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  Facet joint changes at L4-5 and L5-S1    PMFS History: Patient Active Problem List   Diagnosis Date Noted  . Spinal stenosis of lumbar region 10/15/2017  . Chronic bilateral low back pain with left-sided sciatica 10/15/2017  . History of vertigo 05/09/2017  . History of kidney stones 04/10/2017  . 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 DDD (degenerative disc disease), lumbar 01/09/2017  . Gout 01/09/2017  . BPH (benign prostatic hyperplasia) 01/09/2017  . BMI 27.0-27.9,adult 01/09/2017   Past Medical History:  Diagnosis Date  . Allergy   . Anemia   . Arthritis   . Back pain    spondylolisthesis  . Enlarged prostate    takes Flomax daily  . Gout    takes Allopurinol daily    Family History  Problem Relation Age of Onset  . Cancer Mother   . Alzheimer's disease Sister   . Alzheimer's disease Sister     Past Surgical History:  Procedure Laterality Date  . cataract surgery Bilateral   . EYE SURGERY    . [place]ITHOTRIPSY     [place]   Social History   Occupational History  . Not on file  Tobacco Use    . Smoking status: Never Smoker  . Smokeless tobacco: Never Used  Substance and Sexual Activity  . Alcohol use: Yes    Comment: occ beer  . Drug use: No  . Sexual activity: Not on file

## 2017-11-06 ENCOUNTER — Ambulatory Visit: Payer: Medicare Other | Admitting: Physician Assistant

## 2017-11-15 ENCOUNTER — Ambulatory Visit
Admission: RE | Admit: 2017-11-15 | Discharge: 2017-11-15 | Disposition: A | Discharge status: Home / Self Care | Payer: Medicare Other | Source: Ambulatory Visit | Attending: Orthopaedic Surgery | Admitting: Orthopaedic Surgery

## 2017-11-15 DIAGNOSIS — G8929 Other chronic pain: Secondary | ICD-10-CM

## 2017-11-15 DIAGNOSIS — M5442 Lumbago with sciatica, left side: Principal | ICD-10-CM

## 2017-11-15 MED ORDER — IOPAMIDOL (ISOVUE-M 200) INJECTION 41%: 1.0000 mL | Freq: Once | INTRAMUSCULAR | Status: DC

## 2017-11-15 MED ORDER — METHYLPREDNISOLONE ACETATE 40 MG/ML INJ SUSP (RADIOLOG: 120.0000 mg | Freq: Once | INTRAMUSCULAR | Status: DC

## 2017-11-20 ENCOUNTER — Other Ambulatory Visit (INDEPENDENT_AMBULATORY_CARE_PROVIDER_SITE_OTHER): Payer: Self-pay | Admitting: Orthopaedic Surgery

## 2017-11-20 DIAGNOSIS — G8929 Other chronic pain: Secondary | ICD-10-CM

## 2017-11-20 DIAGNOSIS — M5442 Lumbago with sciatica, left side: Principal | ICD-10-CM

## 2017-12-04 DIAGNOSIS — N200 Calculus of kidney: Secondary | ICD-10-CM | POA: Diagnosis not present

## 2017-12-04 DIAGNOSIS — N400 Enlarged prostate without lower urinary tract symptoms: Secondary | ICD-10-CM | POA: Diagnosis not present

## 2017-12-05 ENCOUNTER — Other Ambulatory Visit (INDEPENDENT_AMBULATORY_CARE_PROVIDER_SITE_OTHER): Payer: Self-pay | Admitting: Orthopaedic Surgery

## 2017-12-05 ENCOUNTER — Ambulatory Visit
Admission: RE | Admit: 2017-12-05 | Discharge: 2017-12-05 | Disposition: A | Discharge status: Home / Self Care | Payer: Medicare Other | Source: Ambulatory Visit | Attending: Orthopaedic Surgery | Admitting: Orthopaedic Surgery

## 2017-12-05 DIAGNOSIS — M5442 Lumbago with sciatica, left side: Principal | ICD-10-CM

## 2017-12-05 DIAGNOSIS — G8929 Other chronic pain: Secondary | ICD-10-CM

## 2017-12-05 DIAGNOSIS — M5418 Radiculopathy, sacral and sacrococcygeal region: Secondary | ICD-10-CM | POA: Diagnosis not present

## 2017-12-05 MED ORDER — IOPAMIDOL (ISOVUE-M 200) INJECTION 41%
10.0000 mL | Freq: Once | INTRAMUSCULAR | Status: AC
  Administered 2017-12-05: 10 mL via EPIDURAL

## 2017-12-05 MED ORDER — METHYLPREDNISOLONE ACETATE 40 MG/ML INJ SUSP (RADIOLOG
120.0000 mg | Freq: Once | INTRAMUSCULAR | Status: AC
  Administered 2017-12-05: 120 mg via EPIDURAL

## 2017-12-05 NOTE — Discharge Instructions (Signed)

## 2018-01-29 ENCOUNTER — Telehealth (INDEPENDENT_AMBULATORY_CARE_PROVIDER_SITE_OTHER): Payer: Self-pay | Admitting: Orthopaedic Surgery

## 2018-01-29 ENCOUNTER — Ambulatory Visit (INDEPENDENT_AMBULATORY_CARE_PROVIDER_SITE_OTHER): Payer: Medicare Other | Admitting: Family Medicine

## 2018-01-29 ENCOUNTER — Encounter: Payer: Self-pay | Admitting: Family Medicine

## 2018-01-29 ENCOUNTER — Encounter

## 2018-01-29 ENCOUNTER — Other Ambulatory Visit: Payer: Self-pay

## 2018-01-29 VITALS — BP 123/77 | HR 81 | Temp 98.3°F | Ht 70.08 in | Wt 177.2 lb

## 2018-01-29 DIAGNOSIS — M5442 Lumbago with sciatica, left side: Secondary | ICD-10-CM | POA: Diagnosis not present

## 2018-01-29 DIAGNOSIS — G8929 Other chronic pain: Secondary | ICD-10-CM

## 2018-01-29 NOTE — Progress Notes (Signed)
Subjective:  By signing my name below, I, Stann Ore, attest that this documentation has been prepared under the direction and in the presence of Meredith Staggers, MD. Electronically Signed: Stann Ore, Scribe. 01/29/2018 , 1:57 PM .  Patient was seen in Room 10 .   Patient ID: Levi Phillips, male    DOB: Aug 28, 1940, 77 y.o.   MRN: [id number] Chief Complaint  Patient presents with  . Back Pain    or sciatic pain in left side   HPI Levi Phillips is a 77 y.o. male  Here for back pain. He was seen by Dr. Cleophas Dunker at [facility] orthopedics on Aug 26th for midline low back pain with left sided sciatica. Thought to have spinal claudication, related to facet changes and stenosis. He was given epidural steroid injection with plan follow up in 1 month. He had MRI done reportedly in 2017 with large right disc extrusion L4-5 impinging right L4 nerve root and L5-S1 disc extrusion displacing left S1 nerve root. Per orthopedic note, surgery had been recommended but symptoms improved with exercise and time. 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 He had been seen here on Aug 6th by Dr. Alvy Bimler and referred to orthopedist at that time.   Patient reports being seen by orthopedist in Aug and had injection done but without much relief. He's been doing home exercises for his back, but his pain is still present though intermittent. He notes his pain can become "very bad pain" after sitting in one place for a few minutes. He denies any urinary or bowel incontinence, saddle anesthesia, or lower extremity weakness. He walks on a treadmill in the morning. He hasn't gone back for recheck with Dr. Cleophas Dunker. He hasn't taken any medications for pain. He denies waking up from sleep due to pain.   Patient's primary language is Persian/Farsi.    Fatigue Patient also mentions feeling overall weak with decreased energy present for 8-9 months. He had  repeat electrolyte testing in February including normal creatinine which was elevated previously.   He also shows me a discharge summary from January with a kidney stone.   Patient Active Problem List   Diagnosis Date Noted  . Spinal stenosis of lumbar region 10/15/2017  . Chronic bilateral low back pain with left-sided sciatica 10/15/2017  . History of vertigo 05/09/2017  . History of kidney stones 04/10/2017  . 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 DDD (degenerative disc disease), lumbar 01/09/2017  . Gout 01/09/2017  . BPH (benign prostatic hyperplasia) 01/09/2017  . BMI 27.0-27.9,adult 01/09/2017   Past Medical History:  Diagnosis Date  . Allergy   . Anemia   . Arthritis   . Back pain    spondylolisthesis  . Enlarged prostate    takes Flomax daily  . Gout    takes Allopurinol daily   Past Surgical History:  Procedure Laterality Date  . cataract surgery Bilateral   . EYE SURGERY    . LITHOTRIPSY     done in [place]   No Known Allergies Prior to Admission medications   Medication Sig Start Date End Date Taking? Authorizing Provider  allopurinol (ZYLOPRIM) 100 MG tablet Take 100 mg by mouth daily.    [provider]  meclizine (ANTIVERT) 25 MG tablet Take 1 tablet (25 mg total) by mouth 3 (three) times daily as needed for dizziness. Patient not taking: Reported on 10/15/2017 05/10/17   Porfirio Oar, PA  ondansetron (  ZOFRAN) 4 MG tablet Take 1 tablet (4 mg total) by mouth every 6 (six) hours. Patient not taking: Reported on 10/15/2017 05/10/17   Porfirio Oar, PA  oxyCODONE-acetaminophen (PERCOCET/ROXICET) 5-325 MG tablet Take 2 tablets by mouth every 4 (four) hours as needed for severe pain. Patient not taking: Reported on 10/15/2017 04/10/17   Wynetta Fines, MD   Social History   Socioeconomic History  . Marital status: Married    Spouse name: Not on file  . Number of children: Not on file  . Years of education: Not on file  . Highest education level:  Not on file  Occupational History  . Not on file  Social Needs  . Financial resource strain: Not on file  . Food insecurity:    Worry: Not on file    Inability: Not on file  . Transportation needs:    Medical: Not on file    Non-medical: Not on file  Tobacco Use  . Smoking status: Never Smoker  . Smokeless tobacco: Never Used  Substance and Sexual Activity  . Alcohol use: Yes    Comment: occ beer  . Drug use: No  . Sexual activity: Not on file  Lifestyle  . Physical activity:    Days per week: Not on file    Minutes per session: Not on file  . Stress: Not on file  Relationships  . Social connections:    Talks on phone: Not on file    Gets together: Not on file    Attends religious service: Not on file    Active member of club or organization: Not on file    Attends meetings of clubs or organizations: Not on file    Relationship status: Not on file  . Intimate partner violence:    Fear of current or ex partner: Not on file    Emotionally abused: Not on file    Physically abused: Not on file    Forced sexual activity: Not on file  Other Topics Concern  . Not on file  Social History Narrative   ** Merged History Encounter **       Review of Systems  Constitutional: Positive for fatigue. Negative for unexpected weight change.  Eyes: Negative for visual disturbance.  Respiratory: Negative for cough, chest tightness and shortness of breath.   Cardiovascular: Negative for chest pain, palpitations and leg swelling.  Gastrointestinal: Negative for abdominal pain and blood in stool.  Musculoskeletal: Positive for back pain.  Neurological: Negative for dizziness, weakness, light-headedness and headaches.       Objective:   Physical Exam  Constitutional: He is oriented to person, place, and time. He appears well-developed and well-nourished. No distress.  HENT:  Head: Normocephalic and atraumatic.  Eyes: Pupils are equal, round, and reactive to light. EOM are normal.    Neck: Neck supple.  Cardiovascular: Normal rate.  Pulmonary/Chest: Effort normal. No respiratory distress.  Musculoskeletal: Normal range of motion.  Patient points to location of pain to lumbar spine on the left; L-spine overall intact ROM, able to heel-toe walk without difficulty, negative seated straight leg raise  Neurological: He is alert and oriented to person, place, and time. He displays no Babinski's sign on the right side. He displays no Babinski's sign on the left side.  Reflex Scores:      Patellar reflexes are 2+ on the right side and 2+ on the left side.      Achilles reflexes are 2+ on the right side and 1+ on  the left side. Skin: Skin is warm and dry.  Psychiatric: He has a normal mood and affect. His behavior is normal.  Nursing note and vitals reviewed.   Vitals:   01/29/18 1322  BP: 123/77  Pulse: 81  Temp: 98.3 F (36.8 C)  TempSrc: Oral  SpO2: 96%  Weight: 177 lb 3.2 oz (80.4 kg)  Height: 5' 10.08" (1.78 m)      Assessment & Plan:   Levi Phillips is a 77 y.o. male Chronic left-sided low back pain with left-sided sciatica   - recurrent episodic back pain, sciatica and may be from DDD. Has seen ortho, but has not followed up. May need repeat injection vs PT. Advised to call ortho for follow up. Tylenol if needed, rtc precautions if worse.   Longstanding fatigue without recent changes. Follow up with PCP.   No orders of the defined types were placed in this encounter.  Patient Instructions   Back pain appears to be similar to prior cause - sciatica and may be form discs in back. Please follow up with Dr. Cleophas Dunker to discuss next step such as repeat injection or possible physical therapy. Let us know if you have trouble scheduling that visit. Tylenol if needed for now. If stronger med needed, let me know.   [facility]  [place]  Address: [address] [place], [place], [place] [address] Phone: [phone]  Please follow up with Dr. Alvy Bimler as discussed to discuss fatigue further.  Return to the clinic or go to the nearest emergency room if any of your symptoms worsen or new symptoms occur.    Back Pain, Adult Many adults have back pain from time to time. Common causes of back pain include:  A strained muscle or ligament.  Wear and tear (degeneration) of the spinal disks.  Arthritis.  A hit to the back.  Back pain can be short-lived (acute) or last a long time (chronic). A physical exam, lab tests, and imaging studies may be done to find the cause of your pain. Follow these instructions at home: Managing pain and stiffness  Take over-the-counter and prescription medicines only as told by your health care provider.  If directed, apply heat to the affected area as often as told by your health care provider. Use the heat source that your health care provider recommends, such as a moist heat pack or a heating pad. ? Place a towel between your skin and the heat source. ? Leave the heat on for 20-30 minutes. ? Remove the heat if your skin turns bright red. This is especially important if you are unable to feel pain, heat, or cold. You have a greater risk of getting burned.  If directed, apply ice to the injured area: ? Put ice in a plastic bag. ? Place a towel between your skin and the bag. ? Leave the ice on for 20 minutes, 2-3 times a day for the first 2-3 days. Activity  Do not stay in bed. Resting more than 1-2 days can delay your recovery.  Take short walks on even surfaces as soon as you are able. Try to increase the length of time you walk each day.  Do not sit, drive, or stand in one place for more than 30 minutes at a time. Sitting or standing for long periods of time can put stress on your back.  Use proper lifting techniques. When you bend and lift, use positions that put less stress on your back: ?  Bend your knees. ? Keep the load close to your body. ? Avoid  twisting.  Exercise regularly as told by your health care provider. Exercising will help your back heal faster. This also helps prevent back injuries by keeping muscles strong and flexible.  Your health care provider may recommend that you see a physical therapist. This person can help you come up with a safe exercise program. Do any exercises as told by your physical therapist. Lifestyle  Maintain a healthy weight. Extra weight puts stress on your back and makes it difficult to have good posture.  Avoid activities or situations that make you feel anxious or stressed. Learn ways to manage anxiety and stress. One way to manage stress is through exercise. Stress and anxiety increase muscle tension and can make back pain worse. General instructions  Sleep on a firm mattress in a comfortable position. Try lying on your side with your knees slightly bent. If you lie on your back, put a pillow under your knees.  Follow your treatment plan as told by your health care provider. This may include: ? Cognitive or behavioral therapy. ? Acupuncture or massage therapy. ? Meditation or yoga. Contact a health care provider if:  You have pain that is not relieved with rest or medicine.  You have increasing pain going down into your legs or buttocks.  Your pain does not improve in 2 weeks.  You have pain at night.  You lose weight.  You have a fever or chills. Get help right away if:  You develop new bowel or bladder control problems.  You have unusual weakness or numbness in your arms or legs.  You develop nausea or vomiting.  You develop abdominal pain.  You feel faint. Summary  Many adults have back pain from time to time. A physical exam, lab tests, and imaging studies may be done to find the cause of your pain.  Use proper lifting techniques. When you bend and lift, use positions that put less stress on your back.  Take over-the-counter and prescription medicines and apply heat or  ice as directed by your health care provider. This information is not intended to replace advice given to you by your health care provider. Make sure you discuss any questions you have with your health care provider. Document Released: 02/26/2005 Document Revised: 04/02/2016 Document Reviewed: 04/02/2016 Elsevier Interactive Patient Education  2018 [employer]  Sciatica Sciatica is pain, numbness, weakness, or tingling along the path of the sciatic nerve. The sciatic nerve starts in the lower back and runs down the back of each leg. The nerve controls the muscles in the lower leg and in the back of the knee. It also provides feeling (sensation) to the back of the thigh, the lower leg, and the sole of the foot. Sciatica is a symptom of another medical condition that pinches or puts pressure on the sciatic nerve. Generally, sciatica only affects one side of the body. Sciatica usually goes away on its own or with treatment. In some cases, sciatica may keep coming back (recur). What are the causes? This condition is caused by pressure on the sciatic nerve, or pinching of the sciatic nerve. This may be the result of:  A disk in between the bones of the spine (vertebrae) bulging out too far (herniated disk).  Age-related changes in the spinal disks (degenerative disk disease).  A pain disorder that affects a muscle in the buttock (piriformis syndrome).  Extra bone growth (bone spur) near the sciatic nerve.  An injury or break (fracture) of the pelvis.  Pregnancy.  Tumor (rare).  What increases the risk? The following factors may make you more likely to develop this condition:  Playing sports that place pressure or stress on the spine, such as football or weight lifting.  Having poor strength and flexibility.  A history of back injury.  A history of back surgery.  Sitting for long periods of time.  Doing activities that involve repetitive bending or lifting.  Obesity.  What are  the signs or symptoms? Symptoms can vary from mild to very severe, and they may include:  Any of these problems in the lower back, leg, hip, or buttock: ? Mild tingling or dull aches. ? Burning sensations. ? Sharp pains.  Numbness in the back of the calf or the sole of the foot.  Leg weakness.  Severe back pain that makes movement difficult.  These symptoms may get worse when you cough, sneeze, or laugh, or when you sit or stand for long periods of time. Being overweight may also make symptoms worse. In some cases, symptoms may recur over time. How is this diagnosed? This condition may be diagnosed based on:  Your symptoms.  A physical exam. Your health care provider may ask you to do certain movements to check whether those movements trigger your symptoms.  You may have tests, including: ? Blood tests. ? X-rays. ? MRI. ? CT scan.  How is this treated? In many cases, this condition improves on its own, without any treatment. However, treatment may include:  Reducing or modifying physical activity during periods of pain.  Exercising and stretching to strengthen your abdomen and improve the flexibility of your spine.  Icing and applying heat to the affected area.  Medicines that help: ? To relieve pain and swelling. ? To relax your muscles.  Injections of medicines that help to relieve pain, irritation, and inflammation around the sciatic nerve (steroids).  Surgery.  Follow these instructions at home: Medicines  Take over-the-counter and prescription medicines only as told by your health care provider.  Do not drive or operate heavy machinery while taking prescription pain medicine. Managing pain  If directed, apply ice to the affected area. ? Put ice in a plastic bag. ? Place a towel between your skin and the bag. ? Leave the ice on for 20 minutes, 2-3 times a day.  After icing, apply heat to the affected area before you exercise or as often as told by your  health care provider. Use the heat source that your health care provider recommends, such as a moist heat pack or a heating pad. ? Place a towel between your skin and the heat source. ? Leave the heat on for 20-30 minutes. ? Remove the heat if your skin turns bright red. This is especially important if you are unable to feel pain, heat, or cold. You may have a greater risk of getting burned. Activity  Return to your normal activities as told by your health care provider. Ask your health care provider what activities are safe for you. ? Avoid activities that make your symptoms worse.  Take brief periods of rest throughout the day. Resting in a lying or standing position is usually better than sitting to rest. ? When you rest for longer periods, mix in some mild activity or stretching between periods of rest. This will help to prevent stiffness and pain. ? Avoid sitting for long periods of time without moving. Get up and move around at  least one time each hour.  Exercise and stretch regularly, as told by your health care provider.  Do not lift anything that is heavier than 10 lb (4.5 kg) while you have symptoms of sciatica. When you do not have symptoms, you should still avoid heavy lifting, especially repetitive heavy lifting.  When you lift objects, always use proper lifting technique, which includes: ? Bending your knees. ? Keeping the load close to your body. ? Avoiding twisting. General instructions  Use good posture. ? Avoid leaning forward while sitting. ? Avoid hunching over while standing.  Maintain a healthy weight. Excess weight puts extra stress on your back and makes it difficult to maintain good posture.  Wear supportive, comfortable shoes. Avoid wearing high heels.  Avoid sleeping on a mattress that is too soft or too hard. A mattress that is firm enough to support your back when you sleep may help to reduce your pain.  Keep all follow-up visits as told by your health  care provider. This is important. Contact a health care provider if:  You have pain that wakes you up when you are sleeping.  You have pain that gets worse when you lie down.  Your pain is worse than you have experienced in the past.  Your pain lasts longer than 4 weeks.  You experience unexplained weight loss. Get help right away if:  You lose control of your bowel or bladder (incontinence).  You have: ? Weakness in your lower back, pelvis, buttocks, or legs that gets worse. ? Redness or swelling of your back. ? A burning sensation when you urinate. This information is not intended to replace advice given to you by your health care provider. Make sure you discuss any questions you have with your health care provider. Document Released: 02/20/2001 Document Revised: 08/02/2015 Document Reviewed: 11/05/2014 Elsevier Interactive Patient Education  [employer].    If you have lab work done today you will be contacted with your lab results within the next 2 weeks.  If you have not heard from [place] then please contact us. The fastest way to get your results is to register for My Chart.   IF you received an x-ray today, you will receive an invoice from [facility]. Please contact [facility] at [phone] with questions or concerns regarding your invoice.   IF you received labwork today, you will receive an invoice from [facility]. Please contact [facility] at [phone] with questions or concerns regarding your invoice.   Our billing staff will not be able to assist you with questions regarding bills from these companies.  You will be contacted with the lab results as soon as they are available. The fastest way to get your results is to activate your My Chart account. Instructions are located on the last page of this paperwork. If you have not heard from [place] regarding the results in 2 weeks, please contact this office.       I personally performed the services  described in this documentation, which was scribed in my presence. The recorded information has been reviewed and considered for accuracy and completeness, addended by me as needed, and agree with information above.  Signed,   Meredith Staggers, MD Primary [facility] at [facility].  02/02/18 9:44 PM

## 2018-01-29 NOTE — Patient Instructions (Addendum)
Back pain appears to be similar to prior cause - sciatica and may be form discs in back. Please follow up with Dr. Cleophas Dunker to discuss next step such as repeat injection or possible physical therapy. Let us know if you have trouble scheduling that visit. Tylenol if needed for now. If stronger med needed, let me know.   [facility]  [place]  Address: [address] [place], [place], [place] [address] Phone: [phone]  Please follow up with Dr. Alvy Bimler as discussed to discuss fatigue further.  Return to the clinic or go to the nearest emergency room if any of your symptoms worsen or new symptoms occur.    Back Pain, Adult Many adults have back pain from time to time. Common causes of back pain include:  A strained muscle or ligament.  Wear and tear (degeneration) of the spinal disks.  Arthritis.  A hit to the back.  Back pain can be short-lived (acute) or last a long time (chronic). A physical exam, lab tests, and imaging studies may be done to find the cause of your pain. Follow these instructions at home: Managing pain and stiffness  Take over-the-counter and prescription medicines only as told by your health care provider.  If directed, apply heat to the affected area as often as told by your health care provider. Use the heat source that your health care provider recommends, such as a moist heat pack or a heating pad. ? Place a towel between your skin and the heat source. ? Leave the heat on for 20-30 minutes. ? Remove the heat if your skin turns bright red. This is especially important if you are unable to feel pain, heat, or cold. You have a greater risk of getting burned.  If directed, apply ice to the injured area: ? Put ice in a plastic bag. ? Place a towel between your skin and the bag. ? Leave the ice on for 20 minutes, 2-3 times a day for the first 2-3 days. Activity  Do not stay in bed. Resting more than 1-2 days can  delay your recovery.  Take short walks on even surfaces as soon as you are able. Try to increase the length of time you walk each day.  Do not sit, drive, or stand in one place for more than 30 minutes at a time. Sitting or standing for long periods of time can put stress on your back.  Use proper lifting techniques. When you bend and lift, use positions that put less stress on your back: ? [place] your knees. ? Keep the load close to your body. ? Avoid twisting.  Exercise regularly as told by your health care provider. Exercising will help your back heal faster. This also helps prevent back injuries by keeping muscles strong and flexible.  Your health care provider may recommend that you see a physical therapist. This person can help you come up with a safe exercise program. Do any exercises as told by your physical therapist. Lifestyle  Maintain a healthy weight. Extra weight puts stress on your back and makes it difficult to have good posture.  Avoid activities or situations that make you feel anxious or stressed. Learn ways to manage anxiety and stress. One way to manage stress is through exercise. Stress and anxiety increase muscle tension and can make back pain worse. General instructions  Sleep on a firm mattress in a comfortable position. Try lying on your side with your knees slightly bent. If you  lie on your back, put a pillow under your knees.  Follow your treatment plan as told by your health care provider. This may include: ? Cognitive or behavioral therapy. ? Acupuncture or massage therapy. ? Meditation or yoga. Contact a health care provider if:  You have pain that is not relieved with rest or medicine.  You have increasing pain going down into your legs or buttocks.  Your pain does not improve in 2 weeks.  You have pain at night.  You lose weight.  You have a fever or chills. Get help right away if:  You develop new bowel or bladder control problems.  You have  unusual weakness or numbness in your arms or legs.  You develop nausea or vomiting.  You develop abdominal pain.  You feel faint. Summary  Many adults have back pain from time to time. A physical exam, lab tests, and imaging studies may be done to find the cause of your pain.  Use proper lifting techniques. When you bend and lift, use positions that put less stress on your back.  Take over-the-counter and prescription medicines and apply heat or ice as directed by your health care provider. This information is not intended to replace advice given to you by your health care provider. Make sure you discuss any questions you have with your health care provider. Document Released: 02/26/2005 Document Revised: 04/02/2016 Document Reviewed: 04/02/2016 Elsevier Interactive Patient Education  2018 Elsevier Inc.  Sciatica Sciatica is pain, numbness, weakness, or tingling along the path of the sciatic nerve. The sciatic nerve starts in the lower back and runs down the back of each leg. The nerve controls the muscles in the lower leg and in the back of the knee. It also provides feeling (sensation) to the back of the thigh, the lower leg, and the sole of the foot. Sciatica is a symptom of another medical condition that pinches or puts pressure on the sciatic nerve. Generally, sciatica only affects one side of the body. Sciatica usually goes away on its own or with treatment. In some cases, sciatica may keep coming back (recur). What are the causes? This condition is caused by pressure on the sciatic nerve, or pinching of the sciatic nerve. This may be the result of:  A disk in between the bones of the spine (vertebrae) bulging out too far (herniated disk).  Age-related changes in the spinal disks (degenerative disk disease).  A pain disorder that affects a muscle in the buttock (piriformis syndrome).  Extra bone growth (bone spur) near the sciatic nerve.  An injury or break (fracture) of the  pelvis.  Pregnancy.  Tumor (rare).  What increases the risk? The following factors may make you more likely to develop this condition:  Playing sports that place pressure or stress on the spine, such as football or weight lifting.  Having poor strength and flexibility.  A history of back injury.  A history of back surgery.  Sitting for long periods of time.  Doing activities that involve repetitive bending or lifting.  Obesity.  What are the signs or symptoms? Symptoms can vary from mild to very severe, and they may include:  Any of these problems in the lower back, leg, hip, or buttock: ? Mild tingling or dull aches. ? Burning sensations. ? Sharp pains.  Numbness in the back of the calf or the sole of the foot.  Leg weakness.  Severe back pain that makes movement difficult.  These symptoms may get worse when you  cough, sneeze, or laugh, or when you sit or stand for long periods of time. Being overweight may also make symptoms worse. In some cases, symptoms may recur over time. How is this diagnosed? This condition may be diagnosed based on:  Your symptoms.  A physical exam. Your health care provider may ask you to do certain movements to check whether those movements trigger your symptoms.  You may have tests, including: ? Blood tests. ? X-rays. ? MRI. ? CT scan.  How is this treated? In many cases, this condition improves on its own, without any treatment. However, treatment may include:  Reducing or modifying physical activity during periods of pain.  Exercising and stretching to strengthen your abdomen and improve the flexibility of your spine.  Icing and applying heat to the affected area.  Medicines that help: ? To relieve pain and swelling. ? To relax your muscles.  Injections of medicines that help to relieve pain, irritation, and inflammation around the sciatic nerve (steroids).  Surgery.  Follow these instructions at  home: Medicines  Take over-the-counter and prescription medicines only as told by your health care provider.  Do not drive or operate heavy machinery while taking prescription pain medicine. Managing pain  If directed, apply ice to the affected area. ? Put ice in a plastic bag. ? Place a towel between your skin and the bag. ? Leave the ice on for 20 minutes, 2-3 times a day.  After icing, apply heat to the affected area before you exercise or as often as told by your health care provider. Use the heat source that your health care provider recommends, such as a moist heat pack or a heating pad. ? Place a towel between your skin and the heat source. ? Leave the heat on for 20-30 minutes. ? Remove the heat if your skin turns bright red. This is especially important if you are unable to feel pain, heat, or cold. You may have a greater risk of getting burned. Activity  Return to your normal activities as told by your health care provider. Ask your health care provider what activities are safe for you. ? Avoid activities that make your symptoms worse.  Take brief periods of rest throughout the day. Resting in a lying or standing position is usually better than sitting to rest. ? When you rest for longer periods, mix in some mild activity or stretching between periods of rest. This will help to prevent stiffness and pain. ? Avoid sitting for long periods of time without moving. Get up and move around at least one time each hour.  Exercise and stretch regularly, as told by your health care provider.  Do not lift anything that is heavier than 10 lb (4.5 kg) while you have symptoms of sciatica. When you do not have symptoms, you should still avoid heavy lifting, especially repetitive heavy lifting.  When you lift objects, always use proper lifting technique, which includes: ? Bending your knees. ? Keeping the load close to your body. ? Avoiding twisting. General instructions  Use good  posture. ? Avoid leaning forward while sitting. ? Avoid hunching over while standing.  Maintain a healthy weight. Excess weight puts extra stress on your back and makes it difficult to maintain good posture.  Wear supportive, comfortable shoes. Avoid wearing high heels.  Avoid sleeping on a mattress that is too soft or too hard. A mattress that is firm enough to support your back when you sleep may help to reduce your pain.  Keep all follow-up visits as told by your health care provider. This is important. Contact a health care provider if:  You have pain that wakes you up when you are sleeping.  You have pain that gets worse when you lie down.  Your pain is worse than you have experienced in the past.  Your pain lasts longer than 4 weeks.  You experience unexplained weight loss. Get help right away if:  You lose control of your bowel or bladder (incontinence).  You have: ? Weakness in your lower back, pelvis, buttocks, or legs that gets worse. ? Redness or swelling of your back. ? A burning sensation when you urinate. This information is not intended to replace advice given to you by your health care provider. Make sure you discuss any questions you have with your health care provider. Document Released: 02/20/2001 Document Revised: 08/02/2015 Document Reviewed: 11/05/2014 Elsevier Interactive Patient Education  [employer]2018 Elsevier Inc.    If you have lab work done today you will be contacted with your lab results within the next 2 weeks.  If you have not heard from us then please contact us. The fastest way to get your results is to register for My Chart.   IF you received an x-ray today, you will receive an invoice from [facility]. Please contact [facility] at [phone] with questions or concerns regarding your invoice.   IF you received labwork today, you will receive an invoice from [facility]. Please contact [facility] at [phone] with questions or  concerns regarding your invoice.   Our billing staff will not be able to assist you with questions regarding bills from these companies.  You will be contacted with the lab results as soon as they are available. The fastest way to get your results is to activate your My Chart account. Instructions are located on the last page of this paperwork. If you have not heard from us regarding the results in 2 weeks, please contact this office.

## 2018-01-29 NOTE — Telephone Encounter (Signed)
Patient had one injection in back with no relief. Lt leg pain still there. Patient went to PCP, but PCP sending patient back here. Patient would like to know what to do next. Please call to advise.

## 2018-01-30 ENCOUNTER — Other Ambulatory Visit (INDEPENDENT_AMBULATORY_CARE_PROVIDER_SITE_OTHER): Payer: Self-pay | Admitting: *Deleted

## 2018-01-30 DIAGNOSIS — M4807 Spinal stenosis, lumbosacral region: Secondary | ICD-10-CM

## 2018-01-30 NOTE — Telephone Encounter (Signed)
I called patient, order for MRI placed

## 2018-01-30 NOTE — Telephone Encounter (Signed)
Yes and compare to scan from 2017

## 2018-01-30 NOTE — Telephone Encounter (Signed)
Order MRI L spine.

## 2018-02-02 ENCOUNTER — Encounter: Payer: Self-pay | Admitting: Family Medicine

## 2018-02-07 ENCOUNTER — Ambulatory Visit: Payer: Self-pay | Admitting: *Deleted

## 2018-02-07 NOTE — Telephone Encounter (Signed)
Summary: Rx Request   Pt called and stated he is has an MRI scheduled on December 6th. He has kidney stones and is having some pain. He stated that at his OV with Dr. Neva SeatGreene on 01/29/18 he could request pain medication if the pain became too much. Pt wanted to know if he could get a rx for something to help with pain. Please advise CB# [phone]     Call with help of interpreter services-( # [phone] Pati) Patient states Dr Neva SeatGreene had offered medication when he was in the office- but he declined. He would like to have something for pain now. Explained that Dr Neva Seatgreene is not in the office now and I can offer him an appointment tomorrow to be seen and possibly get some pain relief. He declines the appointment.  Reason for Disposition . [1] Pain radiates into the thigh or further down the leg AND [2] both legs  Answer Assessment - Initial Assessment Questions 1. ONSET: "When did the pain begin?"      4 months  2. LOCATION: "Where does it hurt?" (upper, mid or lower back)     left 3. SEVERITY: "How bad is the pain?"  (e.g., Scale 1-10; mild, moderate, or severe)   - MILD (1-3): doesn't interfere with normal activities    - MODERATE (4-7): interferes with normal activities or awakens from sleep    - SEVERE (8-10): excruciating pain, unable to do any normal activities      severe 4. PATTERN: "Is the pain constant?" (e.g., yes, no; constant, intermittent)      Burns all the time- standing and moving is difficult 5. RADIATION: "Does the pain shoot into your legs or elsewhere?"     Yes- down into the leg 6. CAUSE:  "What do you think is causing the back pain?"      Injection did not help that much 7. BACK OVERUSE:  "Any recent lifting of heavy objects, strenuous work or exercise?"     no 8. MEDICATIONS: "What have you taken so far for the pain?" (e.g., nothing, acetaminophen, NSAIDS)     No- nothing- patient does not know what to take- he wants to protect his kidney 9. NEUROLOGIC SYMPTOMS: "Do you  have any weakness, numbness, or problems with bowel/bladder control?"     Weakness in leg 10. OTHER SYMPTOMS: "Do you have any other symptoms?" (e.g., fever, abdominal pain, burning with urination, blood in urine)       no 11. PREGNANCY: "Is there any chance you are pregnant?" (e.g., yes, no; LMP)       n/a  Protocols used: BACK PAIN-A-AH

## 2018-02-07 NOTE — Telephone Encounter (Signed)
  Additional Information . Commented on: [1] Pain radiates into the thigh or further down the leg AND [2] both legs    Patient was seen and referred for the pain the patient is calling about- he states he had injections that were not that helpful. He would like pain medication while awaiting further studies of back and leg.  Protocols used: BACK PAIN-A-AH

## 2018-02-08 MED ORDER — TRAMADOL HCL 50 MG PO TABS: 50.0000 mg | ORAL_TABLET | Freq: Four times a day (QID) | ORAL | 0 refills | Status: DC | PRN

## 2018-02-08 NOTE — Telephone Encounter (Signed)
Tramadol Rx sent.  Can use every 6 hours as needed for more severe pain, but watch for any increased sedation, lightheadedness or dizziness with that medication and should not drive or operate machinery.  appt needed or can discuss with his orthopaedist if further refills needed.

## 2018-02-09 NOTE — Addendum Note (Signed)
Addended by: Neva SeatGREENE, Jamarian Jacinto R on: 02/09/2018 12:00 AM   Modules accepted: Orders

## 2018-02-10 NOTE — Telephone Encounter (Signed)
Per interpreter, pt stated that he doesn't want to use Tramadol or a lot of of different medications due to his kidney problem. He use Tylenol for severe pain, and if less pain, he doesn't use it. Pt was made aware of side effects with the use of Tramadol. He was also, advised that message will be given to Dr. Neva SeatGreene.

## 2018-02-14 ENCOUNTER — Ambulatory Visit
Admission: RE | Admit: 2018-02-14 | Discharge: 2018-02-14 | Disposition: A | Discharge status: Home / Self Care | Payer: Medicare Other | Source: Ambulatory Visit | Attending: Orthopaedic Surgery | Admitting: Orthopaedic Surgery

## 2018-02-14 DIAGNOSIS — M5126 Other intervertebral disc displacement, lumbar region: Secondary | ICD-10-CM | POA: Diagnosis not present

## 2018-02-14 DIAGNOSIS — M4807 Spinal stenosis, lumbosacral region: Secondary | ICD-10-CM

## 2018-02-14 DIAGNOSIS — M48061 Spinal stenosis, lumbar region without neurogenic claudication: Secondary | ICD-10-CM | POA: Diagnosis not present

## 2018-02-18 ENCOUNTER — Telehealth: Payer: Self-pay | Admitting: Emergency Medicine

## 2018-02-18 NOTE — Telephone Encounter (Unsigned)
Copied from CRM [phone]. Topic: General - Other >> Feb 18, 2018 11:20 AM Marylen PontoMcneil, Ja-Kwan wrote: Reason for CRM: Pt stated he had a MRI on 02/14/18 and he would like the results. Pt stated he is still experiencing pain on left side and back and he would like to know what he else he can do. Pt request call back. Cb# [phone]

## 2018-02-21 NOTE — Telephone Encounter (Signed)
Pt called in again for the MRI results.  He would like someone to call him with those results .    Best number  [phone]

## 2018-02-21 NOTE — Telephone Encounter (Signed)
This is a question for his orthopedist who ordered MRI.  Please reroute this message to Dr.Whitfield's office.  Thanks.

## 2018-02-25 ENCOUNTER — Ambulatory Visit (INDEPENDENT_AMBULATORY_CARE_PROVIDER_SITE_OTHER): Payer: Medicare Other | Admitting: Emergency Medicine

## 2018-02-25 ENCOUNTER — Encounter: Payer: Self-pay | Admitting: Emergency Medicine

## 2018-02-25 ENCOUNTER — Telehealth (INDEPENDENT_AMBULATORY_CARE_PROVIDER_SITE_OTHER): Payer: Self-pay | Admitting: Orthopaedic Surgery

## 2018-02-25 ENCOUNTER — Other Ambulatory Visit: Payer: Self-pay

## 2018-02-25 VITALS — BP 131/76 | HR 76 | Temp 98.1°F | Resp 16 | Ht 68.0 in | Wt 175.4 lb

## 2018-02-25 DIAGNOSIS — M5442 Lumbago with sciatica, left side: Secondary | ICD-10-CM

## 2018-02-25 DIAGNOSIS — R5382 Chronic fatigue, unspecified: Secondary | ICD-10-CM | POA: Diagnosis not present

## 2018-02-25 DIAGNOSIS — M5136 Other intervertebral disc degeneration, lumbar region: Secondary | ICD-10-CM

## 2018-02-25 DIAGNOSIS — Z87898 Personal history of other specified conditions: Secondary | ICD-10-CM

## 2018-02-25 DIAGNOSIS — G8929 Other chronic pain: Secondary | ICD-10-CM

## 2018-02-25 DIAGNOSIS — Z87442 Personal history of urinary calculi: Secondary | ICD-10-CM | POA: Diagnosis not present

## 2018-02-25 DIAGNOSIS — M51369 Other intervertebral disc degeneration, lumbar region without mention of lumbar back pain or lower extremity pain: Secondary | ICD-10-CM

## 2018-02-25 NOTE — Patient Instructions (Addendum)
Back Pain, Adult Back pain is very common. The pain often gets better over time. The cause of back pain is usually not dangerous. Most people can learn to manage their back pain on their own. Follow these instructions at home: Watch your back pain for any changes. The following actions may help to lessen any pain you are feeling:  Stay active. Start with short walks on flat ground if you can. Try to walk farther each day.  Exercise regularly as told by your doctor. Exercise helps your back heal faster. It also helps avoid future injury by keeping your muscles strong and flexible.  Do not sit, drive, or stand in one place for more than 30 minutes.  Do not stay in bed. Resting more than 1-2 days can slow down your recovery.  Be careful when you bend or lift an object. Use good form when lifting: ? Bend at your knees. ? Keep the object close to your body. ? Do not twist.  Sleep on a firm mattress. Lie on your side, and bend your knees. If you lie on your back, put a pillow under your knees.  Take medicines only as told by your doctor.  Put ice on the injured area. ? Put ice in a plastic bag. ? Place a towel between your skin and the bag. ? Leave the ice on for 20 minutes, 2-3 times a day for the first 2-3 days. After that, you can switch between ice and heat packs.  Avoid feeling anxious or stressed. Find good ways to deal with stress, such as exercise.  Maintain a healthy weight. Extra weight puts stress on your back.  Contact a doctor if:  You have pain that does not go away with rest or medicine.  You have worsening pain that goes down into your legs or buttocks.  You have pain that does not get better in one week.  You have pain at night.  You lose weight.  You have a fever or chills. Get help right away if:  You cannot control when you poop (bowel movement) or pee (urinate).  Your arms or legs feel weak.  Your arms or legs lose feeling (numbness).  You feel sick  to your stomach (nauseous) or throw up (vomit).  You have belly (abdominal) pain.  You feel like you may pass out (faint). This information is not intended to replace advice given to you by your health care provider. Make sure you discuss any questions you have with your health care provider. Document Released: 08/15/2007 Document Revised: 08/04/2015 Document Reviewed: 06/30/2013 Elsevier Interactive Patient Education  2018 [employer].  You need to follow-up with your orthopedist as soon as possible. Fatigue Fatigue is feeling tired all of the time, a lack of energy, or a lack of motivation. Occasional or mild fatigue is often a normal response to activity or life in general. However, long-lasting (chronic) or extreme fatigue may indicate an underlying medical condition. Follow these instructions at home: Watch your fatigue for any changes. The following actions may help to lessen any discomfort you are feeling:  Talk to your health care provider about how much sleep you need each night. Try to get the required amount every night.  Take medicines only as directed by your health care provider.  Eat a healthy and nutritious diet. Ask your health care provider if you need help changing your diet.  Drink enough fluid to keep your urine clear or pale yellow.  Practice ways of relaxing, such  as yoga, meditation, massage therapy, or acupuncture.  Exercise regularly.  Change situations that cause you stress. Try to keep your work and personal routine reasonable.  Do not abuse illegal drugs.  Limit alcohol intake to no more than 1 drink per day for nonpregnant women and 2 drinks per day for men. One drink equals 12 ounces of beer, 5 ounces of wine, or 1 ounces of hard liquor.  Take a multivitamin, if directed by your health care provider.  Contact a health care provider if:  Your fatigue does not get better.  You have a fever.  You have unintentional weight loss or gain.  You  have headaches.  You have difficulty: ? Falling asleep. ? Sleeping throughout the night.  You feel angry, guilty, anxious, or sad.  You are unable to have a bowel movement (constipation).  You skin is dry.  Your legs or another part of your body is swollen. Get help right away if:  You feel confused.  Your vision is blurry.  You feel faint or pass out.  You have a severe headache.  You have severe abdominal, pelvic, or back pain.  You have chest pain, shortness of breath, or an irregular or fast heartbeat.  You are unable to urinate or you urinate less than normal.  You develop abnormal bleeding, such as bleeding from the rectum, vagina, nose, lungs, or nipples.  You vomit blood.  You have thoughts about harming yourself or committing suicide.  You are worried that you might harm someone else. This information is not intended to replace advice given to you by your health care provider. Make sure you discuss any questions you have with your health care provider. Document Released: 12/24/2006 Document Revised: 08/04/2015 Document Reviewed: 06/30/2013 Elsevier Interactive Patient Education  [employer]2018 Else[employer] Inc.    If you have lab work done today you will be contacted with your lab results within the next 2 weeks.  If you have not heard from us then please contact us. The fastest way to get your results is to register for My Chart.   IF you received an x-ray today, you will receive an invoice from [facility]. Please contact [facility] at [phone] with questions or concerns regarding your invoice.   IF you received labwork today, you will receive an invoice from [facility]. Please contact [facility] at [phone] with questions or concerns regarding your invoice.   Our billing staff will not be able to assist you with questions regarding bills from these companies.  You will be contacted with the lab results as soon as they are available. The fastest  way to get your results is to activate your My Chart account. Instructions are located on the last page of this paperwork. If you have not heard from us regarding the results in 2 weeks, please contact this office.

## 2018-02-25 NOTE — Telephone Encounter (Signed)
Please call patient and schedule appt w/Dr. Otelia SergeantNitka. See message below. Thank you.

## 2018-02-25 NOTE — Telephone Encounter (Signed)
Please advise 

## 2018-02-25 NOTE — Telephone Encounter (Signed)
Make an appt with Dr Silas FloodNitka-having LBP and LLE radiculopathy. 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

## 2018-02-25 NOTE — Telephone Encounter (Signed)
Patient called stating he had his MRI on 02/14/18 and is requesting a return call to let him know "how to proceed with his back pain."  Patient states he had an injection in his back in September and it has not given him any relief.  Patient requested to talk to Dr. Cleophas DunkerWhitfield before scheduling another appointment.

## 2018-02-25 NOTE — Progress Notes (Signed)
Levi Phillips 77 y.o.   Chief Complaint  Patient presents with  . Back Pain    per patient had MRI wants to discuss results and pain down the left leg    HISTORY OF PRESENT ILLNESS: [name] with history of chronic low back pain.  Recently seen by orthopedist.  Lumbar MRI done on 02/14/2018.  It showed the following: IMPRESSION: Enlargement of the left posterolateral disc herniation at L5-S1 with a caudally migrated fragment, now measuring up to 9 mm, compressing the left S1 nerve.  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 Facet arthropathy with 1 cm of anterolisthesis. Additionally, right posterolateral to foraminal disc herniation with foraminal stenosis that could compress the exiting right L4 nerve.  Worsening of stenosis at L3-4 where there is a shallow disc protrusion in combination with facet hypertrophy right more than left. Stenosis of the lateral recesses and foramina right more than left could cause neural compression, particularly on the right.  Left lateral recess and foraminal narrowing at L2-3 due to chronic disc protrusion, similar to the previous study.  Shallow disc protrusion at L1-2 without visible neural compression, similar to the previous study.  Follow-up with orthopedist still pending.  Saw Dr. Neva Seat on 01/29/2018.  Also complained of chronic fatigue at the time. Has a history of kidney stones and has had kidney function problems in the past.  Uric acid stones. 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  Meclizine makes it better. HPI   Prior to Admission medications   Medication Sig Start Date End Date Taking? Authorizing Provider  Acetaminophen (TYLENOL PO) Take by mouth as needed.   Yes [provider]  allopurinol (ZYLOPRIM) 100 MG tablet Take 100 mg by mouth daily.   Yes [provider]  Pantoprazole Sodium (PROTONIX PO) Take by mouth.   Yes [provider]   tamsulosin (FLOMAX) 0.4 MG CAPS capsule Take 0.4 mg by mouth daily.   Yes [provider]  traMADol (ULTRAM) 50 MG tablet Take 1 tablet (50 mg total) by mouth every 6 (six) hours as needed. 02/08/18  Yes Shade Flood, MD    No Known Allergies  Patient Active Problem List   Diagnosis Date Noted  . Spinal stenosis of lumbar region 10/15/2017  . Chronic bilateral low back pain with left-sided sciatica 10/15/2017  . History of vertigo 05/09/2017  . History of kidney stones 04/10/2017  . 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 DDD (degenerative disc disease), lumbar 01/09/2017  . Gout 01/09/2017  . BPH (benign prostatic hyperplasia) 01/09/2017  . BMI 27.0-27.9,adult 01/09/2017    Past Medical History:  Diagnosis Date  . Allergy   . Anemia   . Arthritis   . Back pain    spondylolisthesis  . Enlarged prostate    takes Flomax daily  . Gout    takes Allopurinol daily    Past Surgical History:  Procedure Laterality Date  . cataract surgery Bilateral   . EYE SURGERY    . LITHOTRIPSY     done in [place]    Social History   Socioeconomic History  . Marital status: Married    Spouse name: Not on file  . Number of children: Not on file  . Years of education: Not on file  . Highest education level: Not on file  Occupational History  . Not on file  Social Needs  . Financial resource strain: Not on file  . Food  insecurity:    Worry: Not on file    Inability: Not on file  . Transportation needs:    Medical: Not on file    Non-medical: Not on file  Tobacco Use  . Smoking status: Never Smoker  . Smokeless tobacco: Never Used  Substance and Sexual Activity  . Alcohol use: Yes    Comment: occ beer  . Drug use: No  . Sexual activity: Not on file  Lifestyle  . Physical activity:    Days per week: Not on file    Minutes per session: Not on file  . Stress: Not on file  Relationships  . Social connections:    Talks on phone: Not on file    Gets together:  Not on file    Attends religious service: Not on file    Active member of club or organization: Not on file    Attends meetings of clubs or organizations: Not on file    Relationship status: Not on file  . Intimate partner violence:    Fear of current or ex partner: Not on file    Emotionally abused: Not on file    Physically abused: Not on file    Forced sexual activity: Not on file  Other Topics Concern  . Not on file  Social History Narrative   ** Merged History Encounter **        Family History  Problem Relation Age of Onset  . Cancer Mother   . Alzheimer's disease Sister   . Alzheimer's disease Sister      Review of Systems  Constitutional: Positive for malaise/fatigue. Negative for chills, fever and weight loss.  HENT: Positive for tinnitus (Mostly right ear). Negative for sore throat.   Eyes: Negative.  Negative for blurred vision and double vision.  Respiratory: Negative.  Negative for cough, sputum production and shortness of breath.   Cardiovascular: Negative.  Negative for chest pain and palpitations.  Gastrointestinal: Negative.  Negative for abdominal pain, blood in stool, diarrhea, melena, nausea and vomiting.  Genitourinary: Negative.  Negative for dysuria and hematuria.  Musculoskeletal: Positive for back pain (Chronic). Negative for myalgias and neck pain.  Skin: Negative.  Negative for rash.  Neurological: Negative.  Negative for dizziness and headaches.  Endo/Heme/Allergies: Negative.   All other systems reviewed and are negative.   Vitals:   02/25/18 0917  BP: 131/76  Pulse: 76  Resp: 16  Temp: 98.1 F (36.7 C)  SpO2: 96%    Physical Exam Vitals signs reviewed.  Constitutional:      Appearance: Normal appearance.  HENT:     Head: Normocephalic and atraumatic.     Right Ear: External ear normal.     Left Ear: External ear normal.     Nose: Nose normal.     Mouth/Throat:     Mouth: Mucous membranes are moist.     Pharynx: Oropharynx is  clear.  Eyes:     Extraocular Movements: Extraocular movements intact.     Conjunctiva/sclera: Conjunctivae normal.     Pupils: Pupils are equal, round, and reactive to light.  Neck:     Musculoskeletal: Normal range of motion and neck supple.  Cardiovascular:     Rate and Rhythm: Normal rate and regular rhythm.     Pulses: Normal pulses.     Heart sounds: Normal heart sounds.  Pulmonary:     Effort: Pulmonary effort is normal.     Breath sounds: Normal breath sounds.  Abdominal:  General: Abdomen is flat. There is no distension.     Tenderness: There is no abdominal tenderness.  Musculoskeletal: Normal range of motion.  Skin:    General: Skin is warm and dry.     Capillary Refill: Capillary refill takes less than 2 seconds.  Neurological:     General: No focal deficit present.     Mental Status: He is alert and oriented to person, place, and time.     Sensory: No sensory deficit.     Motor: No weakness.     Coordination: Coordination normal.     Gait: Gait normal.  Psychiatric:        Mood and Affect: Mood normal.        Behavior: Behavior normal.    A total of 25 minutes was spent in the room with the patient, greater than 50% of which was in counseling/coordination of care regarding chronic diagnosis, treatment, medications, prognosis, lumbar MRI report and review and need for follow-up with orthopedist.   ASSESSMENT & PLAN: Mynor was seen today for back pain.  Diagnoses and all orders for this visit:  Chronic fatigue -     CBC with Differential/Platelet -     Comprehensive metabolic panel -     Uric Acid  Chronic left-sided low back pain with left-sided sciatica  DDD (degenerative disc disease), lumbar  History of vertigo  History of kidney stones    Patient Instructions   Back Pain, Adult Back pain is very common. The pain often gets better over time. The cause of back pain is usually not dangerous. Most people can learn to manage their back pain  on their own. Follow these instructions at home: Watch your back pain for any changes. The following actions may help to lessen any pain you are feeling:  Stay active. Start with short walks on flat ground if you can. Try to walk farther each day.  Exercise regularly as told by your doctor. Exercise helps your back heal faster. It also helps avoid future injury by keeping your muscles strong and flexible.  Do not sit, drive, or stand in one place for more than 30 minutes.  Do not stay in bed. Resting more than 1-2 days can slow down your recovery.  Be careful when you bend or lift an object. Use good form when lifting: ? Bend at your knees. ? Keep the object close to your body. ? Do not twist.  Sleep on a firm mattress. Lie on your side, and bend your knees. If you lie on your back, put a pillow under your knees.  Take medicines only as told by your doctor.  Put ice on the injured area. ? Put ice in a plastic bag. ? Place a towel between your skin and the bag. ? Leave the ice on for 20 minutes, 2-3 times a day for the first 2-3 days. After that, you can switch between ice and heat packs.  Avoid feeling anxious or stressed. Find good ways to deal with stress, such as exercise.  Maintain a healthy weight. Extra weight puts stress on your back.  Contact a doctor if:  You have pain that does not go away with rest or medicine.  You have worsening pain that goes down into your legs or buttocks.  You have pain that does not get better in one week.  You have pain at night.  You lose weight.  You have a fever or chills. Get help right away if:  You cannot  control when you poop (bowel movement) or pee (urinate).  Your arms or legs feel weak.  Your arms or legs lose feeling (numbness).  You feel sick to your stomach (nauseous) or throw up (vomit).  You have belly (abdominal) pain.  You feel like you may pass out (faint). This information is not intended to replace advice  given to you by your health care provider. Make sure you discuss any questions you have with your health care provider. Document Released: 08/15/2007 Document Revised: 08/04/2015 Document Reviewed: 06/30/2013 Elsevier Interactive Patient Education  2018 [employer].  You need to follow-up with your orthopedist as soon as possible. Fatigue Fatigue is feeling tired all of the time, a lack of energy, or a lack of motivation. Occasional or mild fatigue is often a normal response to activity or life in general. However, long-lasting (chronic) or extreme fatigue may indicate an underlying medical condition. Follow these instructions at home: Watch your fatigue for any changes. The following actions may help to lessen any discomfort you are feeling:  Talk to your health care provider about how much sleep you need each night. Try to get the required amount every night.  Take medicines only as directed by your health care provider.  Eat a healthy and nutritious diet. Ask your health care provider if you need help changing your diet.  Drink enough fluid to keep your urine clear or pale yellow.  Practice ways of relaxing, such as yoga, meditation, massage therapy, or acupuncture.  Exercise regularly.  Change situations that cause you stress. Try to keep your work and personal routine reasonable.  Do not abuse illegal drugs.  Limit alcohol intake to no more than 1 drink per day for nonpregnant women and 2 drinks per day for men. One drink equals 12 ounces of beer, 5 ounces of wine, or 1 ounces of hard liquor.  Take a multivitamin, if directed by your health care provider.  Contact a health care provider if:  Your fatigue does not get better.  You have a fever.  You have unintentional weight loss or gain.  You have headaches.  You have difficulty: ? Falling asleep. ? Sleeping throughout the night.  You feel angry, guilty, anxious, or sad.  You are unable to have a bowel movement  (constipation).  You skin is dry.  Your legs or another part of your body is swollen. Get help right away if:  You feel confused.  Your vision is blurry.  You feel faint or pass out.  You have a severe headache.  You have severe abdominal, pelvic, or back pain.  You have chest pain, shortness of breath, or an irregular or fast heartbeat.  You are unable to urinate or you urinate less than normal.  You develop abnormal bleeding, such as bleeding from the rectum, vagina, nose, lungs, or nipples.  You vomit blood.  You have thoughts about harming yourself or committing suicide.  You are worried that you might harm someone else. This information is not intended to replace advice given to you by your health care provider. Make sure you discuss any questions you have with your health care provider. Document Released: 12/24/2006 Document Revised: 08/04/2015 Document Reviewed: 06/30/2013 Elsevier Interactive Patient Education  [employer].    If you have lab work done today you will be contacted with your lab results within the next 2 weeks.  If you have not heard from [place] then please contact us. The fastest way to get your results is  to register for My Chart.   IF you received an x-ray today, you will receive an invoice from [facility]. Please contact [facility] at [phone] with questions or concerns regarding your invoice.   IF you received labwork today, you will receive an invoice from [facility]. Please contact [facility] at [phone] with questions or concerns regarding your invoice.   Our billing staff will not be able to assist you with questions regarding bills from these companies.  You will be contacted with the lab results as soon as they are available. The fastest way to get your results is to activate your My Chart account. Instructions are located on the last page of this paperwork. If you have not heard from [place] regarding the results in 2  weeks, please contact this office.         Edwina Barth, MD [facility]

## 2018-02-26 ENCOUNTER — Encounter: Payer: Self-pay | Admitting: Radiology

## 2018-02-26 LAB — COMPREHENSIVE METABOLIC PANEL
A/G RATIO: 1.7 (ref 1.2–2.2)
ALT: 12 IU/L (ref 0–44)
AST: 14 IU/L (ref 0–40)
Albumin: 4.5 g/dL (ref 3.5–4.8)
Alkaline Phosphatase: 68 IU/L (ref 39–117)
BUN/Creatinine Ratio: 14 (ref 10–24)
BUN: 15 mg/dL (ref 8–27)
Bilirubin Total: 0.6 mg/dL (ref 0.0–1.2)
CO2: 23 mmol/L (ref 20–29)
CREATININE: 1.04 mg/dL (ref 0.76–1.27)
Calcium: 9.5 mg/dL (ref 8.6–10.2)
Chloride: 100 mmol/L (ref 96–106)
GFR calc Af Amer: 79 mL/min/{1.73_m2} (ref >59)
GFR calc non Af Amer: 68 mL/min/{1.73_m2} (ref >59)
GLOBULIN, TOTAL: 2.7 g/dL (ref 1.5–4.5)
Glucose: 104 mg/dL — ABNORMAL HIGH (ref 65–99)
POTASSIUM: 3.9 mmol/L (ref 3.5–5.2)
SODIUM: 138 mmol/L (ref 134–144)
Total Protein: 7.2 g/dL (ref 6.0–8.5)

## 2018-02-26 LAB — CBC WITH DIFFERENTIAL/PLATELET
Basophils Absolute: 0 10*3/uL (ref 0.0–0.2)
Basos: 0 %
EOS (ABSOLUTE): 0.4 10*3/uL (ref 0.0–0.4)
EOS: 5 %
HEMATOCRIT: 42 % (ref 37.5–51.0)
Hemoglobin: 14.4 g/dL (ref 13.0–17.7)
Immature Grans (Abs): 0.1 10*3/uL (ref 0.0–0.1)
Immature Granulocytes: 1 %
LYMPHS ABS: 2.1 10*3/uL (ref 0.7–3.1)
Lymphs: 29 %
MCH: 29.1 pg (ref 26.6–33.0)
MCHC: 34.3 g/dL (ref 31.5–35.7)
MCV: 85 fL (ref 79–97)
Monocytes Absolute: 0.7 10*3/uL (ref 0.1–0.9)
Monocytes: 9 %
Neutrophils Absolute: 4 10*3/uL (ref 1.4–7.0)
Neutrophils: 56 %
Platelets: 201 10*3/uL (ref 150–450)
RBC: 4.95 x10E6/uL (ref 4.14–5.80)
RDW: 13.1 % (ref 12.3–15.4)
WBC: 7.3 10*3/uL (ref 3.4–10.8)

## 2018-02-26 LAB — URIC ACID: Uric Acid: 5.3 mg/dL (ref 3.7–8.6)

## 2018-02-26 NOTE — Telephone Encounter (Signed)
LMOM (12/17 & 12/18) for patient to call our office regarding scheduling a NPT appointment with Dr. Otelia SergeantNitka in January.

## 2018-03-11 ENCOUNTER — Telehealth (INDEPENDENT_AMBULATORY_CARE_PROVIDER_SITE_OTHER): Payer: Self-pay | Admitting: Orthopaedic Surgery

## 2018-03-11 NOTE — Telephone Encounter (Signed)
error 

## 2018-03-11 NOTE — Telephone Encounter (Signed)
Patient called stating he has an appointment with Dr. Prince RomeHilts on 04/02/18.  Patient states "his back is still very painful and requesting a return call to let him know if there is anything he can do until his appointment."

## 2018-03-11 NOTE — Telephone Encounter (Signed)
Advised patient that he has not been seen since 10/2017 by Dr. Cleophas DunkerWhitfield so he cannot make any recommendations since he has not been seen recently. Patient verbalized understanding and will see Dr. Otelia SergeantNitka on 04/02/2018.

## 2018-03-13 ENCOUNTER — Ambulatory Visit (INDEPENDENT_AMBULATORY_CARE_PROVIDER_SITE_OTHER): Payer: Medicare Other | Admitting: Orthopaedic Surgery

## 2018-03-17 ENCOUNTER — Other Ambulatory Visit: Payer: Self-pay | Admitting: Emergency Medicine

## 2018-03-17 NOTE — Telephone Encounter (Signed)
Copied from CRM [id number]. Topic: General >> Mar 17, 2018  2:09 PM Leafy Ro wrote: Pt is still having back pain and he can not see dr Otelia Sergeant until 04-02-2018. Pt said he did not pick up tramadol in nov. Pt would like tramadol send to [facility] [address]. Pt is taking tylenol no relief

## 2018-03-18 ENCOUNTER — Other Ambulatory Visit: Payer: Self-pay | Admitting: Emergency Medicine

## 2018-03-18 NOTE — Telephone Encounter (Signed)
Rx was not refilled again, called pharmacy patient did not pick up on the day rx was prescribed but they stated Patient picked up tramadol on 03/17/2018 the day after. No nothing need to be done at this time.

## 2018-03-21 ENCOUNTER — Other Ambulatory Visit: Payer: Self-pay | Admitting: Emergency Medicine

## 2018-03-21 ENCOUNTER — Telehealth: Payer: Self-pay | Admitting: Emergency Medicine

## 2018-03-21 MED ORDER — HYDROCODONE-ACETAMINOPHEN 5-325 MG PO TABS: 1.0000 | ORAL_TABLET | Freq: Four times a day (QID) | ORAL | 0 refills | Status: DC | PRN

## 2018-03-21 NOTE — Telephone Encounter (Signed)
Copied from CRM [id number]. Topic: General - Other >> Mar 21, 2018  2:04 PM Elliot Gault wrote: Relation to pt: self  Call back [phone] Pharmacy: [facility] [address] [place] - [address] [phone] (Phone) [phone] (Fax)    Reason for call:  Patient experiencing leg and back pain requesting PCP to prescribe Hydrocodone, patient declined appointment stating PCP is aware of concerns, please advise

## 2018-03-21 NOTE — Telephone Encounter (Signed)
Please advise 

## 2018-03-21 NOTE — Telephone Encounter (Signed)
Informed pt that Rx has been sent to pharmacy  

## 2018-03-21 NOTE — Telephone Encounter (Signed)
Prescription sent to pharmacy  Thanks!

## 2018-03-27 ENCOUNTER — Telehealth: Payer: Self-pay | Admitting: *Deleted

## 2018-03-27 NOTE — Telephone Encounter (Signed)
Faxed prescription requested by pharmacy with Dx code for hydrocod/acetam 5-325 mg tab. Confirmation page received at 8:13 am.

## 2018-03-31 ENCOUNTER — Telehealth (INDEPENDENT_AMBULATORY_CARE_PROVIDER_SITE_OTHER): Payer: Self-pay | Admitting: Orthopaedic Surgery

## 2018-03-31 NOTE — Telephone Encounter (Signed)
Patient called stating he would like to talk to Dr. Cleophas DunkerWhitfield directly.  Patient states he would like to discuss being sent to physical therapy, pain management, or have injections.  Patient states he does not want surgery and plans to cancel his appointment with Dr. Otelia SergeantNitka that is scheduled for Wednesday, 04/02/18 but wanted to talk to Dr. Cleophas DunkerWhitfield first.

## 2018-03-31 NOTE — Telephone Encounter (Signed)
I called patient w/appt date and time 04/02/2018 Dr. Otelia SergeantNitka at 10:30

## 2018-03-31 NOTE — Telephone Encounter (Signed)
Please advise 

## 2018-03-31 NOTE — Telephone Encounter (Signed)
Please call and relate that appt with Dr Otelia Sergeant is not necessarily for surgery but to reinforce diagnosis and discuss treatment alternatives like PT, Injections, meds. He is a spine specialist and I would like his input

## 2018-04-02 ENCOUNTER — Ambulatory Visit (INDEPENDENT_AMBULATORY_CARE_PROVIDER_SITE_OTHER): Payer: Medicare Other | Admitting: Specialist

## 2018-04-25 ENCOUNTER — Encounter (INDEPENDENT_AMBULATORY_CARE_PROVIDER_SITE_OTHER): Payer: Self-pay | Admitting: Specialist

## 2018-04-25 ENCOUNTER — Ambulatory Visit (INDEPENDENT_AMBULATORY_CARE_PROVIDER_SITE_OTHER): Payer: Medicare Other

## 2018-04-25 ENCOUNTER — Ambulatory Visit (INDEPENDENT_AMBULATORY_CARE_PROVIDER_SITE_OTHER): Payer: Medicare Other | Admitting: Specialist

## 2018-04-25 VITALS — BP 149/83 | HR 102 | Ht 70.08 in | Wt 172.0 lb

## 2018-04-25 DIAGNOSIS — G8929 Other chronic pain: Secondary | ICD-10-CM

## 2018-04-25 DIAGNOSIS — M5126 Other intervertebral disc displacement, lumbar region: Secondary | ICD-10-CM

## 2018-04-25 DIAGNOSIS — M4316 Spondylolisthesis, lumbar region: Secondary | ICD-10-CM

## 2018-04-25 DIAGNOSIS — M5442 Lumbago with sciatica, left side: Secondary | ICD-10-CM

## 2018-04-25 DIAGNOSIS — M4807 Spinal stenosis, lumbosacral region: Secondary | ICD-10-CM

## 2018-04-25 MED ORDER — TRAMADOL-ACETAMINOPHEN 37.5-325 MG PO TABS: 1.0000 | ORAL_TABLET | Freq: Four times a day (QID) | ORAL | 0 refills | Status: DC | PRN

## 2018-04-25 MED ORDER — GABAPENTIN 100 MG PO CAPS: 100.0000 mg | ORAL_CAPSULE | Freq: Every day | ORAL | 3 refills | Status: DC

## 2018-04-25 NOTE — Patient Instructions (Addendum)
Avoid frequent bending and stooping  No lifting greater than 10 lbs. May use ice or moist heat for pain. Weight loss is of benefit. Best medication for lumbar disc disease is arthritis medications like motrin, celebrex and naprosyn. Exercise is important to improve your indurance and does allow people to function better inspite of back pain. Try chiropractic treatment and see if it will be of benefit. 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   Gabapentin 100 mg at night for pain Hold on any NSAIDs due to past history of nephrolithiasis and reluctance to take any NSAID with potential nephrotoxic affect.  Tramadol in the form of ultracet one every 6 hours for more severe pain.

## 2018-04-25 NOTE — Progress Notes (Addendum)
Office Visit Note   Patient: Levi Phillips           Date of Birth: 02/08/1941           MRN: [id number] Visit Date: 04/25/2018              Requested by: Georgina Quint, MD [address] [place], [place] [address] PCP: Georgina Quint, MD   Assessment & Plan: Visit Diagnoses:  1. Spinal stenosis of lumbosacral region   2. Chronic midline low back pain with left-sided sciatica   3. 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 Spondylolisthesis, lumbar region    78 year old male with low back and left leg sciatica, worsened over the last 2 years. Clinically able to bend and touch his toes yet he describes a recent bout of severe sciatica left leg S1 distribution that was worsened with sitting and bending. MRI shows likely more recent left L5-S1 HNP. 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 Left L5-S1 HNP with left S1 impression that  Like relates to most recent severe episode of sciatica. He is Not wanting any aggressive intervention and conservative therapy is reasonable considering neurotension signs are negative. Try neuromembrane stabilizer gabapentin and mild analgesia. Referral to [facility] chiropractic, [address]. Return in 4 weeks.  Plan: Avoid frequent bending and stooping  No lifting greater than 10 lbs. May use ice or moist heat for pain. Weight loss is of benefit. Best medication for lumbar disc disease is arthritis medications like motrin, celebrex and naprosyn. Exercise is important to improve your indurance and does allow people to function better inspite of back pain. Try chiropractic treatment and see if it will be of benefit. Your condition is acutely due to a disc herniation left L5-S1, Chronic spinal narrowing and L4-5 spondylolisthesis( vertebral slip L4-5) See if chiropractic therapy will help.   Gabapentin 100 mg at night for pain  Hold on any NSAIDs  due to past history of nephrolithiasis and reluctance to take any NSAID with potential nephrotoxic affect.  Tramadol in the form of ultracet one every 6 hours for more severe pain.  Follow-Up Instructions: Return in about 4 weeks (around 05/23/2018).   Orders:  Orders Placed This Encounter  Procedures  . XR Lumb Spine Flex&Ext Only   Meds ordered this encounter  Medications  . gabapentin (NEURONTIN) 100 MG capsule    Sig: Take 1 capsule (100 mg total) by mouth at bedtime.    Dispense:  30 capsule    Refill:  3  . traMADol-acetaminophen (ULTRACET) 37.5-325 MG tablet    Sig: Take 1 tablet by mouth every 6 (six) hours as needed.    Dispense:  30 tablet    Refill:  0      Procedures: No procedures performed   Clinical Data: No additional findings.   Subjective: Chief Complaint  Patient presents with  . Lower Back - Follow-up    78 year old male with history of low back pain with severe radiation into the left buttock and the left posterior thigh and left posterolateral and left plantar foot. He seen in the ER and then by saw Dr. Conni Elliot and was to he had been surgery but with exercises he improved and was able to get by since then in 2017. Standing and walking increases the pain, especially standing in one place is painful. 2 weeks ago he could barely sit due to pain unable  to sit to eat. Has no pain with Cough and sneezing. Sitting and bending worsened the pain. There is left sided pain, BP is elevated with pain. Pain with sleeping in night sometime good and other times not good. Takes tylenol meclizine, does the exercises and these help. No bowel or bladder difficulty. Pain in the left leg is associated with some numbness and tingling. Unable to exercise as usual 45 min per day.    Review of Systems  Constitutional: Positive for activity change and unexpected weight change. Negative for appetite change, chills, diaphoresis, fatigue and fever.  HENT: Negative for dental problem  and drooling.   Eyes: Negative.   Respiratory: Negative.   Cardiovascular: Negative.   Gastrointestinal: Negative.   Endocrine: Negative.   Genitourinary: Negative.   Musculoskeletal: Negative.   Skin: Negative.   Allergic/Immunologic: Negative.   Neurological: Negative.   Hematological: Negative.   Psychiatric/Behavioral: Negative.      Objective: Vital Signs: BP (!) 149/83 (BP Location: Left Arm, Patient Position: Sitting)   Pulse (!) 102   Ht 5' 10.08" (1.78 m)   Wt 171 lb 15.3 oz (78 kg)   BMI 24.62 kg/m   Physical Exam Constitutional:      Appearance: He is well-developed.  HENT:     Head: Normocephalic and atraumatic.  Eyes:     Pupils: Pupils are equal, round, and reactive to light.  Neck:     Musculoskeletal: Normal range of motion and neck supple.  Pulmonary:     Effort: Pulmonary effort is normal.     Breath sounds: Normal breath sounds.  Abdominal:     General: Bowel sounds are normal.     Palpations: Abdomen is soft.  Skin:    General: Skin is warm and dry.  Neurological:     Mental Status: He is alert and oriented to person, place, and time.  Psychiatric:        Behavior: Behavior normal.        Thought Content: Thought content normal.        Judgment: Judgment normal.     Back Exam   Tenderness  The patient is experiencing tenderness in the lumbar.  Range of Motion  Extension:  20 abnormal  Flexion: normal  Lateral bend right: normal  Lateral bend left: normal  Rotation right: normal  Rotation left: normal   Muscle Strength  Right Quadriceps:  5/5  Left Quadriceps:  5/5  Right Hamstrings:  5/5  Left Hamstrings:  5/5   Tests  Straight leg raise right: negative Straight leg raise left: negative  Reflexes  Patellar:  2/4 normal Achilles:  0/4 abnormal Biceps: 2/4 Babinski's sign: normal   Other  Toe walk: normal Heel walk: normal Sensation: normal Gait: normal  Erythema: no back redness Scars: absent  Comments:  Absent  left ankle reflex. SLR negative. Motor with no focal neurologic deficit.      Specialty Comments:  No specialty comments available.  Imaging: Xr Lumb Spine Flex&ext Only  Result Date: 04/25/2018 AP and lateral flexion and extension radiographs lumbar spine show DDD with narrowing of disc height L1-2, L2-3, L3-4, L4-5 and L5-S1. There is straightening of the normal lordotic curve to to anterior column shortening due to loss of disc height. There is a grade 1 anterolisthesis of L4 on L5 with hypertrophic bone changes over the posterior L4-5 facets no spondylolysis noted. No significant worsening of the higher grade 1 slip with flexion and extension radiographs.     PMFS History:  Patient Active Problem List   Diagnosis Date Noted  . Spinal stenosis of lumbar region 10/15/2017  . Chronic bilateral low back pain with left-sided sciatica 10/15/2017  . History of vertigo 05/09/2017  . History of kidney stones 04/10/2017  . 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 DDD (degenerative disc disease), lumbar 01/09/2017  . Gout 01/09/2017  . BPH (benign prostatic hyperplasia) 01/09/2017  . BMI 27.0-27.9,adult 01/09/2017   Past Medical History:  Diagnosis Date  . Allergy   . Anemia   . Arthritis   . Back pain    spondylolisthesis  . Enlarged prostate    takes Flomax daily  . Gout    takes Allopurinol daily    Family History  Problem Relation Age of Onset  . Cancer Mother   . Alzheimer's disease Sister   . Alzheimer's disease Sister     Past Surgical History:  Procedure Laterality Date  . cataract surgery Bilateral   . EYE SURGERY    . LITHOTRIPSY     done in [place]   Social History   Occupational History  . Not on file  Tobacco Use  . Smoking status: Never Smoker  . Smokeless tobacco: Never Used  Substance and Sexual Activity  . Alcohol use: Yes    Comment: occ beer  . Drug use: No  . Sexual activity: Not on file

## 2018-05-08 ENCOUNTER — Other Ambulatory Visit (INDEPENDENT_AMBULATORY_CARE_PROVIDER_SITE_OTHER): Payer: Self-pay | Admitting: Specialist

## 2018-05-08 NOTE — Telephone Encounter (Signed)
Per patient, he has been trying to get in touch with Dr. Barbaraann Faster front desk for two days now, but no one answers. Patient has two questions that he would like someone to call him back in reference to. Patient has finished rx for Tramadol. He took it 1 poq 6hrs for two days, 1 poq 8hrs for two days, and 1 poq 12hrs for two days. Patient has been doing pool therapy everyday. Patient now feeling 50% better. Patient wants to know if he needs a refill on medication, and if so please send to [place] on [address]. Also, patient has a question about some other therapy he is suppose to be doing, but does not know if that will be okay to do, or too strenuous on his back. (not sure what therapy he was referring too) Please call patient to discuss.

## 2018-05-09 NOTE — Telephone Encounter (Signed)
Patient states that he is having a lot of pain, he is asking if he needs to continue on the pain meds on days that are really bad for him, he states that it has been helping him.  And he asked if he needs to continue doing water exercises, I did advise that water exercises are good for the body.  And I advised that I would send a request to Dr. Otelia Sergeant for refill on Tramadol.

## 2018-05-12 MED ORDER — TRAMADOL-ACETAMINOPHEN 37.5-325 MG PO TABS: 1.0000 | ORAL_TABLET | Freq: Four times a day (QID) | ORAL | 0 refills | Status: DC | PRN

## 2018-05-19 ENCOUNTER — Ambulatory Visit (INDEPENDENT_AMBULATORY_CARE_PROVIDER_SITE_OTHER): Payer: Medicare Other | Admitting: Specialist

## 2018-05-19 ENCOUNTER — Encounter (INDEPENDENT_AMBULATORY_CARE_PROVIDER_SITE_OTHER): Payer: Self-pay | Admitting: Specialist

## 2018-05-19 VITALS — BP 116/73 | HR 90 | Ht 70.0 in | Wt 172.0 lb

## 2018-05-19 DIAGNOSIS — M48062 Spinal stenosis, lumbar region with neurogenic claudication: Secondary | ICD-10-CM

## 2018-05-19 NOTE — Progress Notes (Signed)
Office Visit Note   Patient: Levi Phillips           Date of Birth: [birth year]           MRN: [id number] Visit Date: 05/19/2018              Requested by: Georgina Quint, MD [address] [place], [place] [address] PCP: Georgina Quint, MD   Assessment & Plan: Visit Diagnoses:  1. Spinal stenosis of lumbar region with neurogenic claudication     Plan:Avoid bending, stooping and avoid lifting weights greater than 10 lbs. Avoid prolong standing and walking. Avoid frequent bending and stooping  No lifting greater than 10 lbs. May use ice or moist heat for pain. Weight loss is of benefit. Handicap license is approved.  Follow-Up Instructions: Return in about 6 weeks (around 06/30/2018).   Orders:  No orders of the defined types were placed in this encounter.  No orders of the defined types were placed in this encounter.     Procedures: No procedures performed   Clinical Data: No additional findings.   Subjective: Chief Complaint  Patient presents with  . Lower Back - Follow-up    78 year old male with history of lumbar spinal stenosis, he has pain in the left leg and into the bone in the left leg. He has pain with standing and walking. He is unable to do bicycling due to recent pain with sitting. Walking in the swimming pool is better and the pain is improved 20% since his last visit. He is better sitting and with lying down.   Review of Systems  Constitutional: Negative.   HENT: Negative.   Eyes: Negative.   Respiratory: Negative.   Cardiovascular: Negative.   Gastrointestinal: Negative.   Endocrine: Negative.   Genitourinary: Negative.   Musculoskeletal: Negative.   Skin: Negative.   Allergic/Immunologic: Negative.   Neurological: Negative.   Hematological: Negative.   Psychiatric/Behavioral: Negative.      Objective: Vital Signs: BP 116/73 (BP Location: Left Arm, Patient Position: Sitting)   Pulse 90   Ht 5\' 10"  (1.778 m)   Wt 172 lb  (78 kg)   BMI 24.68 kg/m   Physical Exam Constitutional:      Appearance: He is well-developed.  HENT:     Head: Normocephalic and atraumatic.  Eyes:     Pupils: Pupils are equal, round, and reactive to light.  Neck:     Musculoskeletal: Normal range of motion and neck supple.  Pulmonary:     Effort: Pulmonary effort is normal.     Breath sounds: Normal breath sounds.  Abdominal:     General: Bowel sounds are normal.     Palpations: Abdomen is soft.  Skin:    General: Skin is warm and dry.  Neurological:     Mental Status: He is alert and oriented to person, place, and time.  Psychiatric:        Behavior: Behavior normal.        Thought Content: Thought content normal.        Judgment: Judgment normal.     Back Exam   Tenderness  The patient is experiencing tenderness in the lumbar.  Range of Motion  Extension: abnormal  Flexion: abnormal  Lateral bend right: normal  Lateral bend left: normal  Rotation right: normal  Rotation left: normal   Muscle Strength  Right Quadriceps:  5/5  Left Quadriceps:  5/5  Right Hamstrings:  5/5   Tests  Straight leg  raise right: negative Straight leg raise left: negative  Reflexes  Patellar: 2/4 Achilles: 2/4 Biceps: abnormal Babinski's sign: normal   Other  Toe walk: normal Heel walk: normal Sensation: normal Gait: normal  Erythema: no back redness Scars: absent      Specialty Comments:  No specialty comments available.  Imaging: No results found.   PMFS History: Patient Active Problem List   Diagnosis Date Noted  . Spinal stenosis of lumbar region 10/15/2017  . Chronic bilateral low back pain with left-sided sciatica 10/15/2017  . History of vertigo 05/09/2017  . History of kidney stones 04/10/2017  . 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 DDD (degenerative disc disease), lumbar 01/09/2017  . Gout 01/09/2017  . BPH (benign prostatic hyperplasia) 01/09/2017  . BMI 27.0-27.9,adult 01/09/2017    Past Medical History:  Diagnosis Date  . Allergy   . Anemia   . Arthritis   . Back pain    spondylolisthesis  . Enlarged prostate    takes Flomax daily  . Gout    takes Allopurinol daily    Family History  Problem Relation Age of Onset  . Cancer Mother   . Alzheimer's disease Sister   . Alzheimer's disease Sister     Past Surgical History:  Procedure Laterality Date  . cataract surgery Bilateral   . EYE SURGERY    . LITHOTRIPSY     done in [place]   Social History   Occupational History  . Not on file  Tobacco Use  . Smoking status: Never Smoker  . Smokeless tobacco: Never Used  Substance and Sexual Activity  . Alcohol use: Yes    Comment: occ beer  . Drug use: No  . Sexual activity: Not on file

## 2018-05-19 NOTE — Patient Instructions (Signed)
Avoid bending, stooping and avoid lifting weights greater than 10 lbs. Avoid prolong standing and walking. Avoid frequent bending and stooping  No lifting greater than 10 lbs. May use ice or moist heat for pain. Weight loss is of benefit. Handicap license is approved.  

## 2018-06-30 ENCOUNTER — Ambulatory Visit (INDEPENDENT_AMBULATORY_CARE_PROVIDER_SITE_OTHER): Payer: Medicare Other | Admitting: Specialist

## 2018-08-07 ENCOUNTER — Ambulatory Visit: Payer: Self-pay | Admitting: Specialist

## 2018-08-10 ENCOUNTER — Telehealth: Payer: Self-pay | Admitting: Internal Medicine

## 2018-08-10 NOTE — Telephone Encounter (Signed)
Using [employer] contacted the patient.  Left message to call [facility] at [phone] to discuss follow-up of his visit to Dr. Otelia Sergeant, 08/07/18.  Patient will need to know information regarding the reason for the call.  He was informed that he will need to use an interpreter service.

## 2018-08-22 ENCOUNTER — Other Ambulatory Visit: Payer: Self-pay

## 2018-08-22 ENCOUNTER — Ambulatory Visit (INDEPENDENT_AMBULATORY_CARE_PROVIDER_SITE_OTHER): Payer: Medicare HMO | Admitting: Specialist

## 2018-08-22 ENCOUNTER — Encounter: Payer: Self-pay | Admitting: Specialist

## 2018-08-22 VITALS — BP 115/66 | HR 88 | Ht 70.0 in | Wt 172.0 lb

## 2018-08-22 DIAGNOSIS — G8929 Other chronic pain: Secondary | ICD-10-CM

## 2018-08-22 DIAGNOSIS — M25561 Pain in right knee: Secondary | ICD-10-CM

## 2018-08-22 DIAGNOSIS — M48061 Spinal stenosis, lumbar region without neurogenic claudication: Secondary | ICD-10-CM

## 2018-08-22 DIAGNOSIS — M5126 Other intervertebral disc displacement, lumbar region: Secondary | ICD-10-CM | POA: Diagnosis not present

## 2018-08-22 DIAGNOSIS — M4316 Spondylolisthesis, lumbar region: Secondary | ICD-10-CM | POA: Diagnosis not present

## 2018-08-22 MED ORDER — DICLOFENAC SODIUM 1 % TD GEL: 4.0000 g | Freq: Four times a day (QID) | TRANSDERMAL | 4 refills | Status: DC

## 2018-08-22 NOTE — Progress Notes (Addendum)
Office Visit Note   Patient: Levi Phillips           Date of Birth: Oct 05, 1940           MRN: [id number] Visit Date: 08/22/2018              Requested by: Horald Pollen, MD [address] [place],  [place] [address] PCP: Horald Pollen, MD   Assessment & Plan: Visit Diagnoses:  1. Spondylolisthesis, lumbar region   2. 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 Spinal stenosis of lumbar region without neurogenic claudication     Plan:Avoid bending, stooping and avoid lifting weights greater than 10 lbs. Avoid prolong standing and walking. Avoid frequent bending and stooping  No lifting greater than 10 lbs. May use ice or moist heat for pain. Knee straight leg raises and the last 20-30 degrees of bending exercises to strengthen the thigh muscles Helps decrease right knee pain. Avoid squatting and kneeling. Voltaren gel 1% use 4 grams applied and rub into the anterior right knee 3-4 times per day for at least 10-14 days Follow-Up Instructions: No follow-ups on file.   Orders:  No orders of the defined types were placed in this encounter.  No orders of the defined types were placed in this encounter.     Procedures: No procedures performed   Clinical Data: No additional findings.   Subjective: Chief Complaint  Patient presents with  . Lower Back - Follow-up  . Right Knee - Pain    78 year old male with multilevel lumbar DDD with disc protrusion left L5-S1 and spondylolisthesis at L4-5 and multiple level degenerative disc disease. His spondylolisthesis is grade 1 L4-5.  He experiences right knee pain And has had to avoid the [employer] due to the recent COVID-19 pandemic. There he was doing exercise regularly and now is doing mainly exercise at home. No bowel or bladder difficulty.    Review of Systems  Constitutional: Negative.   HENT: Negative.   Eyes: Negative.   Respiratory: Negative.   Cardiovascular:  Negative.   Gastrointestinal: Negative.   Endocrine: Negative.   Genitourinary: Negative.   Musculoskeletal: Positive for back pain.  Skin: Negative.   Allergic/Immunologic: Negative.   Neurological: Positive for weakness and numbness. Negative for dizziness, tremors, seizures, syncope, facial asymmetry, speech difficulty, light-headedness and headaches.  Hematological: Negative.   Psychiatric/Behavioral: Negative.      Objective: Vital Signs: BP 115/66 (BP Location: Left Arm, Patient Position: Sitting)   Pulse 88   Ht 5\' 10"  (1.778 m)   Wt 172 lb (78 kg)   BMI 24.68 kg/m   Physical Exam Constitutional:      Appearance: He is well-developed.  HENT:     Head: Normocephalic and atraumatic.  Eyes:     Pupils: Pupils are equal, round, and reactive to light.  Neck:     Musculoskeletal: Normal range of motion and neck supple.  Pulmonary:     Effort: Pulmonary effort is normal.     Breath sounds: Normal breath sounds.  Abdominal:     General: Bowel sounds are normal.     Palpations: Abdomen is soft.  Musculoskeletal: Normal range of motion.  Skin:    General: Skin is warm and dry.  Neurological:     Mental Status: He is alert and oriented to person, place, and time.  Psychiatric:        Behavior: Behavior normal.        Thought  Content: Thought content normal.        Judgment: Judgment normal.     Back Exam   Tenderness  The patient is experiencing tenderness in the lumbar.  Range of Motion  Extension: normal  Flexion: normal  Lateral bend right: normal  Rotation right: normal  Rotation left: normal   Muscle Strength  Right Quadriceps:  5/5  Left Quadriceps:  5/5  Right Hamstrings:  5/5  Left Hamstrings:  5/5   Tests  Straight leg raise right: negative Straight leg raise left: negative  Reflexes  Patellar: 0/4 Achilles: 0/4 Babinski's sign: normal   Other  Toe walk: abnormal Heel walk: abnormal Sensation: normal Erythema: no back redness Scars:  absent      Specialty Comments:  No specialty comments available.  Imaging: No results found.   PMFS History: Patient Active Problem List   Diagnosis Date Noted  . Spinal stenosis of lumbar region 10/15/2017  . Chronic bilateral low back pain with left-sided sciatica 10/15/2017  . History of vertigo 05/09/2017  . History of kidney stones 04/10/2017  . 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 DDD (degenerative disc disease), lumbar 01/09/2017  . Gout 01/09/2017  . BPH (benign prostatic hyperplasia) 01/09/2017  . BMI 27.0-27.9,adult 01/09/2017   Past Medical History:  Diagnosis Date  . Allergy   . Anemia   . Arthritis   . Back pain    spondylolisthesis  . Enlarged prostate    takes Flomax daily  . Gout    takes Allopurinol daily    Family History  Problem Relation Age of Onset  . Cancer Mother   . Alzheimer's disease Sister   . Alzheimer's disease Sister     Past Surgical History:  Procedure Laterality Date  . cataract surgery Bilateral   . EYE SURGERY    . LITHOTRIPSY     [place]   Social History   Occupational History  . Not on file  Tobacco Use  . Smoking status: Never Smoker  . Smokeless tobacco: Never Used  Substance and Sexual Activity  . Alcohol use: Yes    Comment: occ beer  . Drug use: No  . Sexual activity: Not on file

## 2018-08-22 NOTE — Addendum Note (Signed)
Addended by: Basil Dess on: 08/22/2018 11:25 AM   Modules accepted: Orders

## 2018-08-22 NOTE — Patient Instructions (Addendum)
Avoid bending, stooping and avoid lifting weights greater than 10 lbs. Avoid prolong standing and walking. Avoid frequent bending and stooping  No lifting greater than 10 lbs. May use ice or moist heat for pain. Weight loss is of benefit. Handicap license is approved. Knee straight leg raises and the last 20-30 degrees of bending exercises to strengthen the thigh muscles Helps decrease right knee pain. Avoid squatting and kneeling. Voltaren gel 1% use 4 grams applied and rub into the anterior right knee 3-4 times per day for at least 10-14 days

## 2018-09-15 ENCOUNTER — Other Ambulatory Visit: Payer: Self-pay

## 2018-09-15 ENCOUNTER — Ambulatory Visit (INDEPENDENT_AMBULATORY_CARE_PROVIDER_SITE_OTHER): Payer: Medicare HMO | Admitting: Emergency Medicine

## 2018-09-15 ENCOUNTER — Encounter: Payer: Self-pay | Admitting: Emergency Medicine

## 2018-09-15 VITALS — BP 125/78 | HR 76 | Temp 98.2°F | Resp 16 | Wt 178.0 lb

## 2018-09-15 DIAGNOSIS — N400 Enlarged prostate without lower urinary tract symptoms: Secondary | ICD-10-CM | POA: Diagnosis not present

## 2018-09-15 MED ORDER — TAMSULOSIN HCL 0.4 MG PO CAPS: 0.4000 mg | ORAL_CAPSULE | Freq: Every day | ORAL | 1 refills | Status: DC

## 2018-09-15 NOTE — Progress Notes (Signed)
Levi Phillips 78 y.o.   Chief Complaint  Patient presents with   Medication Refill    Protral Brand name per patient [facility] generic - Flomax, need 90 day    HISTORY OF PRESENT ILLNESS: This is a 78 y.o. male with history of BPH on Flomax, needs medication refill.  No other complaints or medical concerns today.  Requesting PSA test.  Last one was done in 2016 and it was normal.  HPI   Prior to Admission medications   Medication Sig Start Date End Date Taking? Authorizing Provider  allopurinol (ZYLOPRIM) 100 MG tablet Take 100 mg by mouth daily.   Yes [provider]  tamsulosin (FLOMAX) 0.4 MG CAPS capsule Take 0.4 mg by mouth daily.   Yes [provider]  Acetaminophen (TYLENOL PO) Take by mouth as needed.    [provider]  diclofenac sodium (VOLTAREN) 1 % GEL Apply 4 g topically 4 (four) times daily. Patient not taking: Reported on 09/15/2018 08/22/18   Kerrin ChampagneNitka, James E, MD  gabapentin (NEURONTIN) 100 MG capsule Take 1 capsule (100 mg total) by mouth at bedtime. Patient not taking: Reported on 09/15/2018 04/25/18   Kerrin ChampagneNitka, James E, MD  HYDROcodone-acetaminophen (NORCO) 5-325 MG tablet Take 1 tablet by mouth every 6 (six) hours as needed for moderate pain. Patient not taking: Reported on 09/15/2018 03/21/18   Georgina QuintSagardia, Johny Pitstick Jose, MD  Pantoprazole Sodium (PROTONIX PO) Take by mouth.    [provider]  traMADol (ULTRAM) 50 MG tablet Take 1 tablet (50 mg total) by mouth every 6 (six) hours as needed. Patient not taking: Reported on 09/15/2018 02/08/18   Shade FloodGreene, Jeffrey R, MD  traMADol-acetaminophen (ULTRACET) 37.5-325 MG tablet Take 1 tablet by mouth every 6 (six) hours as needed. Patient not taking: Reported on 09/15/2018 05/12/18   Kerrin ChampagneNitka, James E, MD    Not on File  Patient Active Problem List   Diagnosis Date Noted   Spinal stenosis of lumbar region 10/15/2017   Chronic bilateral low back pain with left-sided sciatica 10/15/2017   History of vertigo  05/09/2017   History of kidney stones 04/10/2017   Renal colic on left side 04/10/2017   DDD (degenerative disc disease), lumbar 01/09/2017   Gout 01/09/2017   BPH (benign prostatic hyperplasia) 01/09/2017   BMI 27.0-27.9,adult 01/09/2017    Past Medical History:  Diagnosis Date   Allergy    Anemia    Arthritis    Back pain    spondylolisthesis   Enlarged prostate    takes Flomax daily   Gout    takes Allopurinol daily    Past Surgical History:  Procedure Laterality Date   cataract surgery Bilateral    EYE SURGERY     LITHOTRIPSY     done in [place]    Social History   Socioeconomic History   Marital status: Married    Spouse name: Not on file   Number of children: Not on file   Years of education: Not on file   Highest education level: Not on file  Occupational History   Not on file  Social Needs   Financial resource strain: Not on file   Food insecurity    Worry: Not on file    Inability: Not on file   Transportation needs    Medical: Not on file    Non-medical: Not on file  Tobacco Use   Smoking status: Never Smoker   Smokeless tobacco: Never Used  Substance and Sexual Activity   Alcohol use:  Yes    Comment: occ beer   Drug use: No   Sexual activity: Not on file  Lifestyle   Physical activity    Days per week: Not on file    Minutes per session: Not on file   Stress: Not on file  Relationships   Social connections    Talks on phone: Not on file    Gets together: Not on file    Attends religious service: Not on file    Active member of club or organization: Not on file    Attends meetings of clubs or organizations: Not on file    Relationship status: Not on file   Intimate partner violence    Fear of current or ex partner: Not on file    Emotionally abused: Not on file    Physically abused: Not on file    Forced sexual activity: Not on file  Other Topics Concern   Not on file  Social History Narrative    ** Merged History Encounter **        Family History  Problem Relation Age of Onset   Cancer Mother    Alzheimer's disease Sister    Alzheimer's disease Sister      Review of Systems  Constitutional: Negative.  Negative for chills and fever.  HENT: Negative.  Negative for sinus pain and sore throat.   Respiratory: Negative.  Negative for cough and shortness of breath.   Cardiovascular: Negative.  Negative for chest pain and palpitations.  Gastrointestinal: Negative for abdominal pain, nausea and vomiting.  Genitourinary: Negative.   Skin: Negative.  Negative for rash.  Neurological: Negative for dizziness and headaches.  All other systems reviewed and are negative.   Vitals:   09/15/18 1623  BP: 125/78  Pulse: 76  Resp: 16  Temp: 98.2 F (36.8 C)  SpO2: 97%    Physical Exam Vitals signs reviewed.  Constitutional:      Appearance: Normal appearance.  HENT:     Head: Normocephalic and atraumatic.     Mouth/Throat:     Mouth: Mucous membranes are moist.     Pharynx: Oropharynx is clear.  Eyes:     Extraocular Movements: Extraocular movements intact.     Pupils: Pupils are equal, round, and reactive to light.  Neck:     Musculoskeletal: Normal range of motion and neck supple.  Cardiovascular:     Rate and Rhythm: Normal rate and regular rhythm.     Heart sounds: Normal heart sounds.  Pulmonary:     Effort: Pulmonary effort is normal.     Breath sounds: Normal breath sounds.  Musculoskeletal: Normal range of motion.  Skin:    General: Skin is warm and dry.  Neurological:     General: No focal deficit present.     Mental Status: He is alert and oriented to person, place, and time.  Psychiatric:        Mood and Affect: Mood normal.        Behavior: Behavior normal.      ASSESSMENT & PLAN: Levi Phillips was seen today for medication refill.  Diagnoses and all orders for this visit:  Benign prostatic hyperplasia, unspecified whether lower urinary tract  symptoms present -     PSA, Medicare -     tamsulosin (FLOMAX) 0.4 MG CAPS capsule; Take 1 capsule (0.4 mg total) by mouth daily.    Patient Instructions       If you have lab work done today you will be  contacted with your lab results within the next 2 weeks.  If you have not heard from us then please contact us. The fastest way to get your results is to register for My Chart.   IF you received an x-ray today, you will receive an invoice from [facility]. Please contact [facility] at [phone] with questions or concerns regarding your invoice.   IF you received labwork today, you will receive an invoice from [facility]. Please contact [facility] at [phone] with questions or concerns regarding your invoice.   Our billing staff will not be able to assist you with questions regarding bills from these companies.  You will be contacted with the lab results as soon as they are available. The fastest way to get your results is to activate your My Chart account. Instructions are located on the last page of this paperwork. If you have not heard from us regarding the results in 2 weeks, please contact this office.     Benign Prostatic Hyperplasia  Benign prostatic hyperplasia (BPH) is an enlarged prostate gland that is caused by the normal aging process and not by cancer. The prostate is a walnut-sized gland that is involved in the production of semen. It is located in front of the rectum and below the bladder. The bladder stores urine and the urethra is the tube that carries the urine out of the body. The prostate may get bigger as a man gets older. An enlarged prostate can press on the urethra. This can make it harder to pass urine. The build-up of urine in the bladder can cause infection. Back pressure and infection may progress to bladder damage and kidney (renal) failure. What are the causes? This condition is part of a normal aging process. However, not all men  develop problems from this condition. If the prostate enlarges away from the urethra, urine flow will not be blocked. If it enlarges toward the urethra and compresses it, there will be problems passing urine. What increases the risk? This condition is more likely to develop in men over the age of 50 years. What are the signs or symptoms? Symptoms of this condition include:  Getting up often during the night to urinate.  Needing to urinate frequently during the day.  Difficulty starting urine flow.  Decrease in size and strength of your urine stream.  Leaking (dribbling) after urinating.  Inability to pass urine. This needs immediate treatment.  Inability to completely empty your bladder.  Pain when you pass urine. This is more common if there is also an infection.  Urinary tract infection (UTI). How is this diagnosed? This condition is diagnosed based on your medical history, a physical exam, and your symptoms. Tests will also be done, such as:  A post-void bladder scan. This measures any amount of urine that may remain in your bladder after you finish urinating.  A digital rectal exam. In a rectal exam, your health care provider checks your prostate by putting a lubricated, gloved finger into your rectum to feel the back of your prostate gland. This exam detects the size of your gland and any abnormal lumps or growths.  An exam of your urine (urinalysis).  A prostate specific antigen (PSA) screening. This is a blood test used to screen for prostate cancer.  An ultrasound. This test uses sound waves to electronically produce a picture of your prostate gland. Your health care provider may refer you to a specialist in kidney and prostate diseases (urologist). How is this treated? Once  symptoms begin, your health care provider will monitor your condition (active surveillance or watchful waiting). Treatment for this condition will depend on the severity of your condition. Treatment  may include:  Observation and yearly exams. This may be the only treatment needed if your condition and symptoms are mild.  Medicines to relieve your symptoms, including: ? Medicines to shrink the prostate. ? Medicines to relax the muscle of the prostate.  Surgery in severe cases. Surgery may include: ? Prostatectomy. In this procedure, the prostate tissue is removed completely through an open incision or with a laparoscope or robotics. ? Transurethral resection of the prostate (TURP). In this procedure, a tool is inserted through the opening at the tip of the penis (urethra). It is used to cut away tissue of the inner core of the prostate. The pieces are removed through the same opening of the penis. This removes the blockage. ? Transurethral incision (TUIP). In this procedure, small cuts are made in the prostate. This lessens the prostate's pressure on the urethra. ? Transurethral microwave thermotherapy (TUMT). This procedure uses microwaves to create heat. The heat destroys and removes a small amount of prostate tissue. ? Transurethral needle ablation (TUNA). This procedure uses radio frequencies to destroy and remove a small amount of prostate tissue. ? Interstitial laser coagulation (ILC). This procedure uses a laser to destroy and remove a small amount of prostate tissue. ? Transurethral electrovaporization (TUVP). This procedure uses electrodes to destroy and remove a small amount of prostate tissue. ? Prostatic urethral lift. This procedure inserts an implant to push the lobes of the prostate away from the urethra. Follow these instructions at home:  Take over-the-counter and prescription medicines only as told by your health care provider.  Monitor your symptoms for any changes. Contact your health care provider with any changes.  Avoid drinking large amounts of liquid before going to bed or out in public.  Avoid or reduce how much caffeine or alcohol you drink.  Give yourself  time when you urinate.  Keep all follow-up visits as told by your health care provider. This is important. Contact a health care provider if:  You have unexplained back pain.  Your symptoms do not get better with treatment.  You develop side effects from the medicine you are taking.  Your urine becomes very dark or has a bad smell.  Your lower abdomen becomes distended and you have trouble passing your urine. Get help right away if:  You have a fever or chills.  You suddenly cannot urinate.  You feel lightheaded, or very dizzy, or you faint.  There are large amounts of blood or clots in the urine.  Your urinary problems become hard to manage.  You develop moderate to severe low back or flank pain. The flank is the side of your body between the ribs and the hip. These symptoms may represent a serious problem that is an emergency. Do not wait to see if the symptoms will go away. Get medical help right away. Call your local emergency services (911 in the [place] Do not drive yourself to the hospital. Summary  Benign prostatic hyperplasia (BPH) is an enlarged prostate that is caused by the normal aging process and not by cancer.  An enlarged prostate can press on the urethra. This can make it hard to pass urine.  This condition is part of a normal aging process and is more likely to develop in men over the age of 50 years.  Get help right away if  you suddenly cannot urinate. This information is not intended to replace advice given to you by your health care provider. Make sure you discuss any questions you have with your health care provider. Document Released: 02/26/2005 Document Revised: 01/21/2018 Document Reviewed: 04/02/2016 Elsevier Patient Education  2020 Elsevier Inc.      Edwina Barth, MD [facility]

## 2018-09-15 NOTE — Patient Instructions (Addendum)
If you have lab work done today you will be contacted with your lab results within the next 2 weeks.  If you have not heard from [place] then please contact us. The fastest way to get your results is to register for My Chart.   IF you received an x-ray today, you will receive an invoice from [facility]. Please contact [facility] at [phone] with questions or concerns regarding your invoice.   IF you received labwork today, you will receive an invoice from [facility]. Please contact [facility] at [phone] with questions or concerns regarding your invoice.   Our billing staff will not be able to assist you with questions regarding bills from these companies.  You will be contacted with the lab results as soon as they are available. The fastest way to get your results is to activate your My Chart account. Instructions are located on the last page of this paperwork. If you have not heard from [place] regarding the results in 2 weeks, please contact this office.     Benign Prostatic Hyperplasia  Benign prostatic hyperplasia (BPH) is an enlarged prostate gland that is caused by the normal aging process and not by cancer. The prostate is a walnut-sized gland that is involved in the production of semen. It is located in front of the rectum and below the bladder. The bladder stores urine and the urethra is the tube that carries the urine out of the body. The prostate may get bigger as a man gets older. An enlarged prostate can press on the urethra. This can make it harder to pass urine. The build-up of urine in the bladder can cause infection. Back pressure and infection may progress to bladder damage and kidney (renal) failure. What are the causes? This condition is part of a normal aging process. However, not all men develop problems from this condition. If the prostate enlarges away from the urethra, urine flow will not be blocked. If it enlarges toward the urethra and compresses  it, there will be problems passing urine. What increases the risk? This condition is more likely to develop in men over the age of 13 years. What are the signs or symptoms? Symptoms of this condition include:  Getting up often during the night to urinate.  Needing to urinate frequently during the day.  Difficulty starting urine flow.  Decrease in size and strength of your urine stream.  Leaking (dribbling) after urinating.  Inability to pass urine. This needs immediate treatment.  Inability to completely empty your bladder.  Pain when you pass urine. This is more common if there is also an infection.  Urinary tract infection (UTI). How is this diagnosed? This condition is diagnosed based on your medical history, a physical exam, and your symptoms. Tests will also be done, such as:  A post-void bladder scan. This measures any amount of urine that may remain in your bladder after you finish urinating.  A digital rectal exam. In a rectal exam, your health care provider checks your prostate by putting a lubricated, gloved finger into your rectum to feel the back of your prostate gland. This exam detects the size of your gland and any abnormal lumps or growths.  An exam of your urine (urinalysis).  A prostate specific antigen (PSA) screening. This is a blood test used to screen for prostate cancer.  An ultrasound. This test uses sound waves to electronically produce a picture of your prostate gland. Your health care provider may refer you to  a specialist in kidney and prostate diseases (urologist). How is this treated? Once symptoms begin, your health care provider will monitor your condition (active surveillance or watchful waiting). Treatment for this condition will depend on the severity of your condition. Treatment may include:  Observation and yearly exams. This may be the only treatment needed if your condition and symptoms are mild.  Medicines to relieve your symptoms,  including: ? Medicines to shrink the prostate. ? Medicines to relax the muscle of the prostate.  Surgery in severe cases. Surgery may include: ? Prostatectomy. In this procedure, the prostate tissue is removed completely through an open incision or with a laparoscope or robotics. ? Transurethral resection of the prostate (TURP). In this procedure, a tool is inserted through the opening at the tip of the penis (urethra). It is used to cut away tissue of the inner core of the prostate. The pieces are removed through the same opening of the penis. This removes the blockage. ? Transurethral incision (TUIP). In this procedure, small cuts are made in the prostate. This lessens the prostate's pressure on the urethra. ? Transurethral microwave thermotherapy (TUMT). This procedure uses microwaves to create heat. The heat destroys and removes a small amount of prostate tissue. ? Transurethral needle ablation (TUNA). This procedure uses radio frequencies to destroy and remove a small amount of prostate tissue. ? Interstitial laser coagulation (ILC). This procedure uses a laser to destroy and remove a small amount of prostate tissue. ? Transurethral electrovaporization (TUVP). This procedure uses electrodes to destroy and remove a small amount of prostate tissue. ? Prostatic urethral lift. This procedure inserts an implant to push the lobes of the prostate away from the urethra. Follow these instructions at home:  Take over-the-counter and prescription medicines only as told by your health care provider.  Monitor your symptoms for any changes. Contact your health care provider with any changes.  Avoid drinking large amounts of liquid before going to bed or out in public.  Avoid or reduce how much caffeine or alcohol you drink.  Give yourself time when you urinate.  Keep all follow-up visits as told by your health care provider. This is important. Contact a health care provider if:  You have  unexplained back pain.  Your symptoms do not get better with treatment.  You develop side effects from the medicine you are taking.  Your urine becomes very dark or has a bad smell.  Your lower abdomen becomes distended and you have trouble passing your urine. Get help right away if:  You have a fever or chills.  You suddenly cannot urinate.  You feel lightheaded, or very dizzy, or you faint.  There are large amounts of blood or clots in the urine.  Your urinary problems become hard to manage.  You develop moderate to severe low back or flank pain. The flank is the side of your body between the ribs and the hip. These symptoms may represent a serious problem that is an emergency. Do not wait to see if the symptoms will go away. Get medical help right away. Call your local emergency services (911 in the [place] Do not drive yourself to the hospital. Summary  Benign prostatic hyperplasia (BPH) is an enlarged prostate that is caused by the normal aging process and not by cancer.  An enlarged prostate can press on the urethra. This can make it hard to pass urine.  This condition is part of a normal aging process and is more likely to develop in   men over the age of 41 years.  Get help right away if you suddenly cannot urinate. This information is not intended to replace advice given to you by your health care provider. Make sure you discuss any questions you have with your health care provider. Document Released: 02/26/2005 Document Revised: 01/21/2018 Document Reviewed: 04/02/2016 Elsevier Patient Education  2020 [employer].

## 2018-09-15 NOTE — Addendum Note (Signed)
Addended by: Alfredia Ferguson A on: 09/15/2018 05:22 PM   Modules accepted: Orders

## 2018-09-15 NOTE — Addendum Note (Signed)
Addended by: Alfredia Ferguson A on: 09/15/2018 05:21 PM   Modules accepted: Orders

## 2018-09-16 LAB — PSA: Prostate Specific Ag, Serum: 2.7 ng/mL (ref 0.0–4.0)

## 2018-09-22 ENCOUNTER — Encounter: Payer: Self-pay | Admitting: *Deleted

## 2018-09-29 DIAGNOSIS — N200 Calculus of kidney: Secondary | ICD-10-CM | POA: Diagnosis not present

## 2018-10-08 DIAGNOSIS — N200 Calculus of kidney: Secondary | ICD-10-CM | POA: Diagnosis not present

## 2018-10-08 DIAGNOSIS — N401 Enlarged prostate with lower urinary tract symptoms: Secondary | ICD-10-CM | POA: Diagnosis not present

## 2018-10-08 DIAGNOSIS — R351 Nocturia: Secondary | ICD-10-CM | POA: Diagnosis not present

## 2018-12-01 ENCOUNTER — Ambulatory Visit (INDEPENDENT_AMBULATORY_CARE_PROVIDER_SITE_OTHER): Payer: Medicare HMO | Admitting: Registered Nurse

## 2018-12-01 ENCOUNTER — Other Ambulatory Visit: Payer: Self-pay

## 2018-12-01 DIAGNOSIS — Z23 Encounter for immunization: Secondary | ICD-10-CM

## 2018-12-01 NOTE — Patient Instructions (Signed)
° ° ° °  If you have lab work done today you will be contacted with your lab results within the next 2 weeks.  If you have not heard from us then please contact us. The fastest way to get your results is to register for My Chart. ° ° °IF you received an x-ray today, you will receive an invoice from [facility]. Please contact [facility] at [phone] with questions or concerns regarding your invoice.  ° °IF you received labwork today, you will receive an invoice from [facility]. Please contact [facility] at [phone] with questions or concerns regarding your invoice.  ° °Our billing staff will not be able to assist you with questions regarding bills from these companies. ° °You will be contacted with the lab results as soon as they are available. The fastest way to get your results is to activate your My Chart account. Instructions are located on the last page of this paperwork. If you have not heard from us regarding the results in 2 weeks, please contact this office. °  ° ° ° °

## 2018-12-19 ENCOUNTER — Other Ambulatory Visit: Payer: Self-pay | Admitting: Emergency Medicine

## 2018-12-19 NOTE — Telephone Encounter (Signed)
Requested medication (s) are due for refill today: no  Requested medication (s) are on the active medication list: no  Last refill:  Last filled by historical provider 2 years ago  Future visit scheduled: No  Notes to clinic:  Also see request for Flomax which is not on current med list.     Requested Prescriptions  Pending Prescriptions Disp Refills   allopurinol (ZYLOPRIM) 100 MG tablet       Sig: Take 1 tablet (100 mg total) by mouth daily.     Endocrinology:  Gout Agents Passed - 12/19/2018  5:22 PM      Passed - Uric Acid in normal range and within 360 days    Uric Acid  Date Value Ref Range Status  02/25/2018 5.3 3.7 - 8.6 mg/dL Final    Comment:               Therapeutic target for gout patients: <6.0         Passed - Cr in normal range and within 360 days    Creatinine, Ser  Date Value Ref Range Status  02/25/2018 1.04 0.76 - 1.27 mg/dL Final         Passed - Valid encounter within last 12 months    Recent Outpatient Visits          3 months ago Benign prostatic hyperplasia, unspecified whether lower urinary tract symptoms present   [facility] at [facility], Ines [place]er, MD   9 months ago Chronic fatigue   Primary [facility] at [place], [place], [place]   10 months ago Chronic left-sided low back pain with left-sided sciatica   [facility] at [facility], Ranell Patrick, MD   1 year ago DDD (degenerative disc disease), lumbar   Primary Care at [facility], Ines Bloomer, MD   1 year ago Renal colic on left side   Primary Care at [facility] [place], [place]

## 2018-12-19 NOTE — Telephone Encounter (Signed)
Patient is also requesting a refill for tamsulosin  (FLOMAX).

## 2018-12-19 NOTE — Telephone Encounter (Signed)
Patient is also refill for tamsulosin (FLOMAX) 0.4

## 2018-12-19 NOTE — Telephone Encounter (Signed)
Medication Refill - Medication: tamsulosin (FLOMAX) 0.4 MG CAPS capsule allopurinol (ZYLOPRIM) 100 MG tablet    Has the patient contacted their pharmacy? Yes.   (Agent: If no, request that the patient contact the pharmacy for the refill.) (Agent: If yes, when and what did the pharmacy advise?)  Preferred Pharmacy (with phone number or street name): [place], [place].  Agent: Please be advised that RX refills may take up to 3 business days. We ask that you follow-up with your pharmacy.

## 2018-12-23 NOTE — Telephone Encounter (Signed)
Patient checking on the status of medication mentioned below

## 2018-12-24 MED ORDER — ALLOPURINOL 100 MG PO TABS: 100.0000 mg | ORAL_TABLET | Freq: Every day | ORAL | 2 refills | Status: DC

## 2019-01-13 IMAGING — CT CT ABD-PELV W/O CM
2 of 4 series · 16 of 46 positions shown, 18 images · non-contrast
Comparison: None

ADDENDUM:
First sentence of the impression of the report should be corrected
to state:

LEFT hydronephrosis and hydroureter secondary to a 5 mm LEFT
URETEROVESICAL JUNCTION calculus (incorrectly reported as UPJ
calculus in the impression of the initial report but correctly
localized in the body of the report).
Remainder of exam as previously dictated.
CLINICAL DATA: LEFT flank pain and nausea, history kidney stones
EXAM:
CT ABDOMEN AND PELVIS WITHOUT CONTRAST
TECHNIQUE: Multidetector CT imaging of the abdomen and pelvis was performed
following the standard protocol without IV contrast. Sagittal and
coronal MPR images reconstructed from axial data set. Oral contrast
not administered for this indication.

[Series 2: axial st · axial · 0.84mm/px · z∈[-708,-244]mm · 13 of 105 slices shown, 15 images]
[im 6/105  soft-tissue]
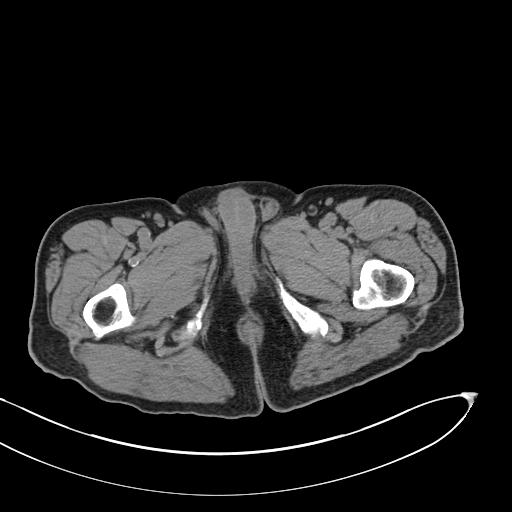
[im 6/105  bone]
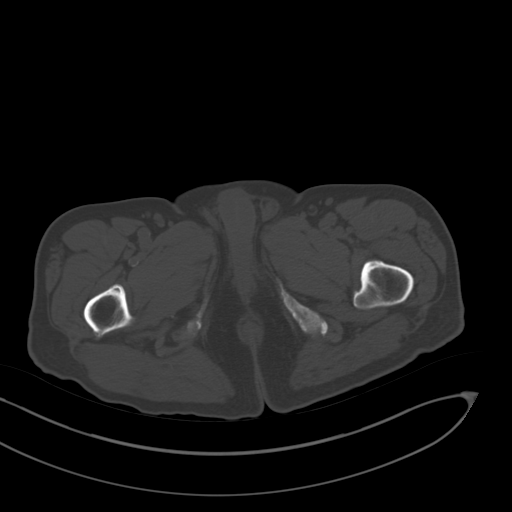
[im 12/105  soft-tissue]
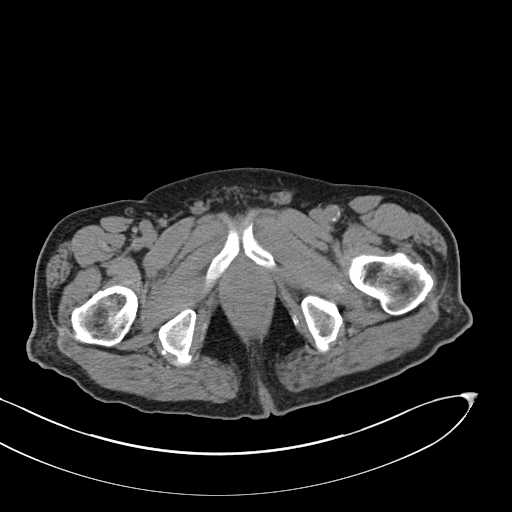
[im 24/105  soft-tissue]
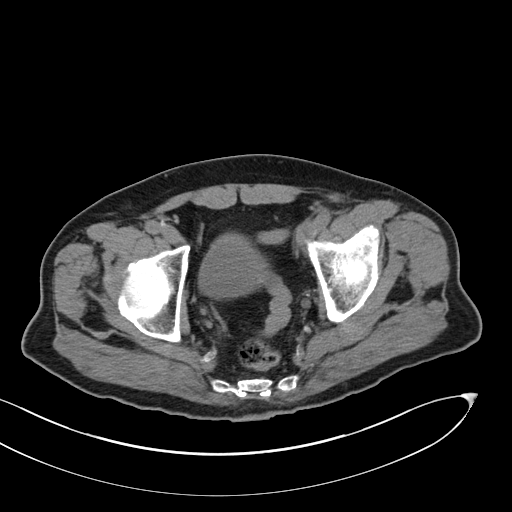
[im 29/105  soft-tissue]
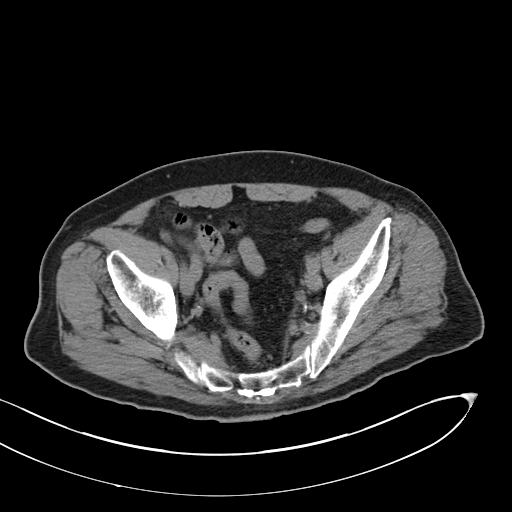
[im 35/105  soft-tissue]
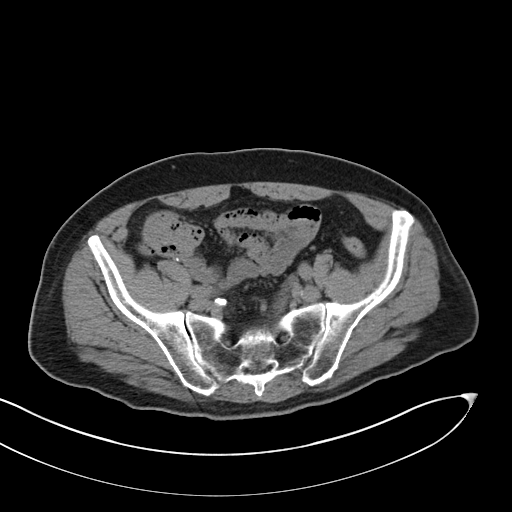
[im 47/105  soft-tissue]
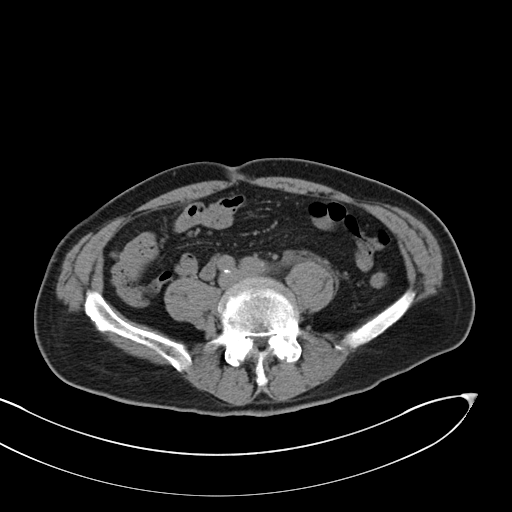
[im 53/105  soft-tissue]
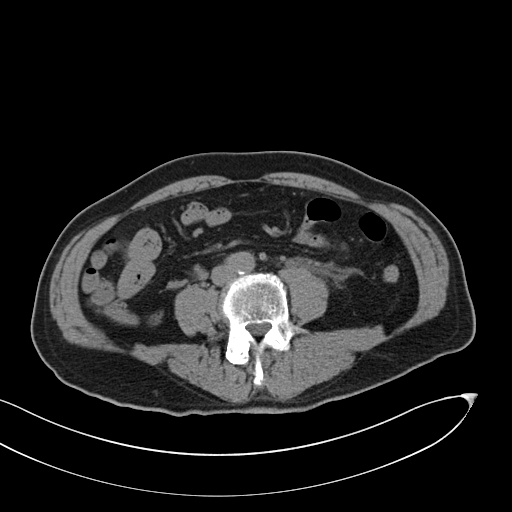
[im 58/105  soft-tissue]
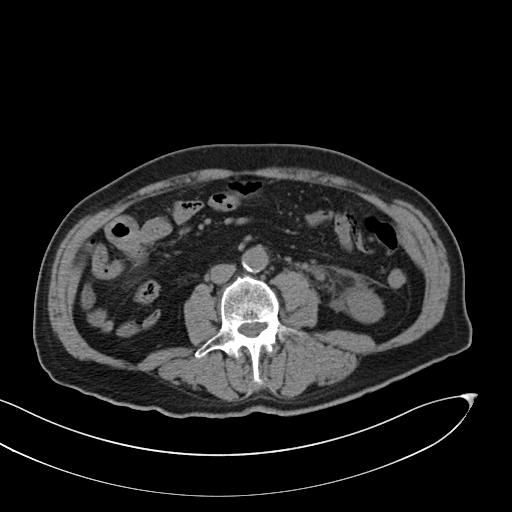
[im 70/105  soft-tissue]
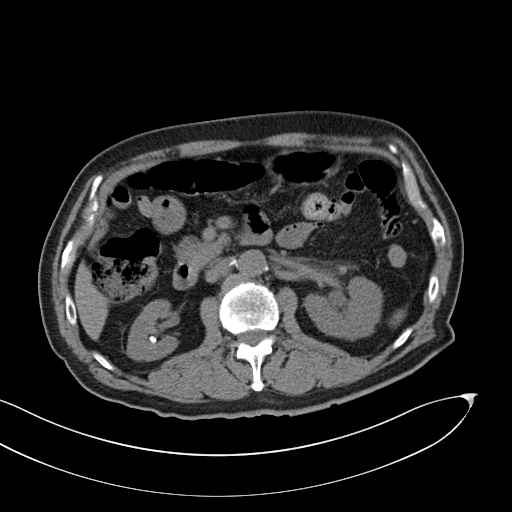
[im 70/105  bone]
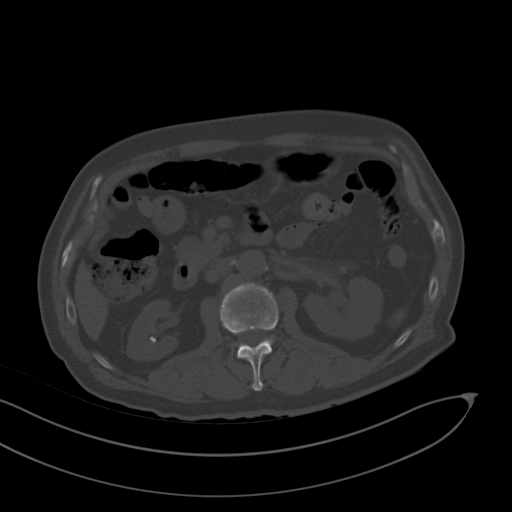
[im 76/105  soft-tissue]
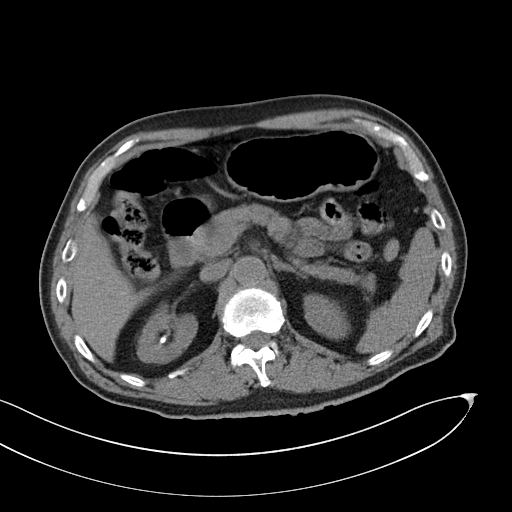
[im 81/105  soft-tissue]
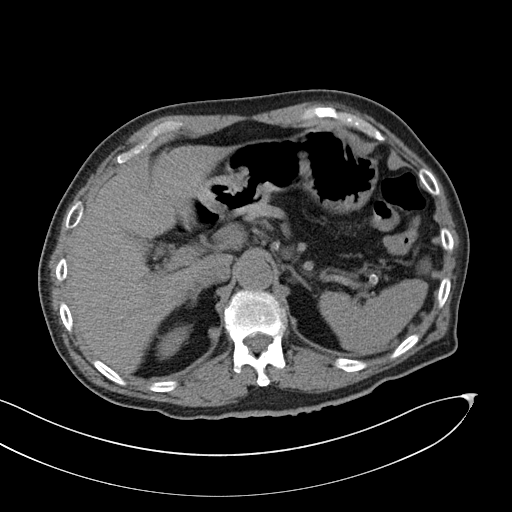
[im 93/105  soft-tissue]
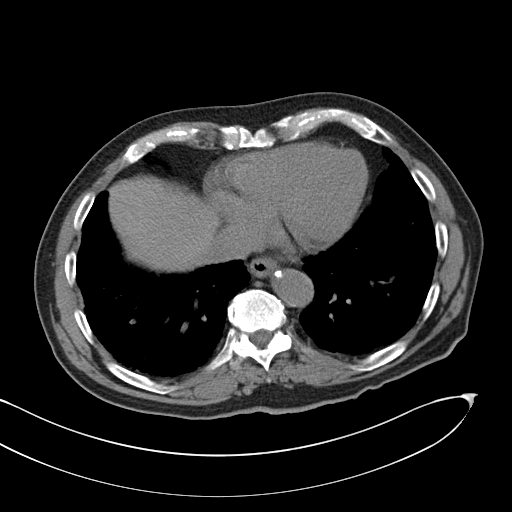
[im 99/105  soft-tissue]
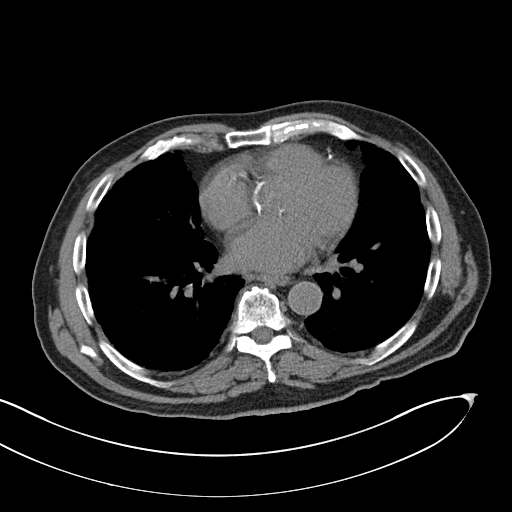

[Series 5: coronal st · coronal · 0.84mm/px · 3 of 101 slices shown]
[im 34/101  soft-tissue]
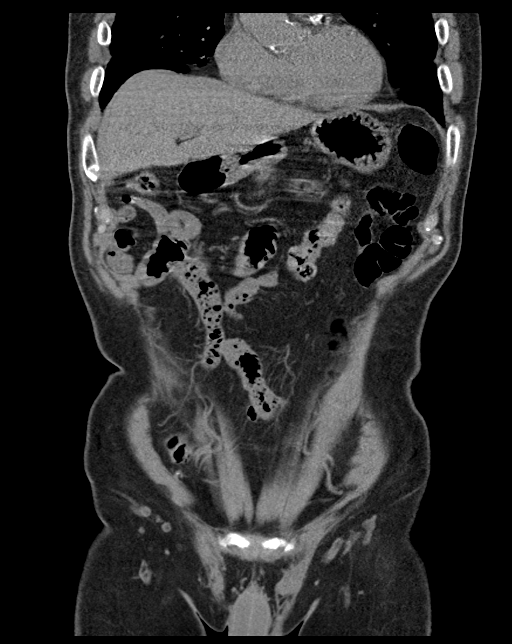
[im 45/101  soft-tissue]
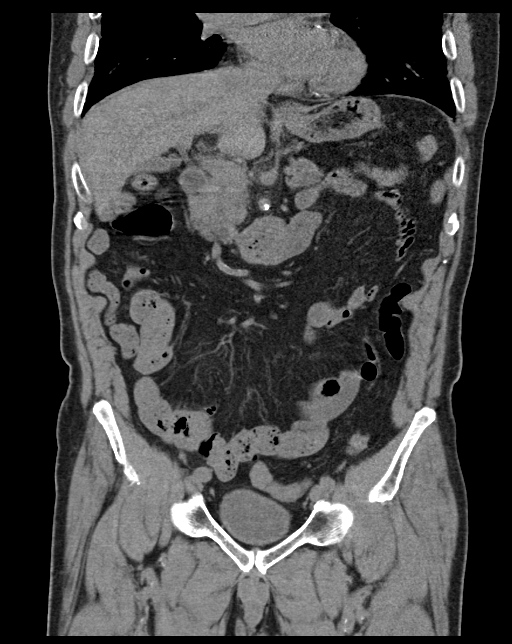
[im 56/101  soft-tissue]
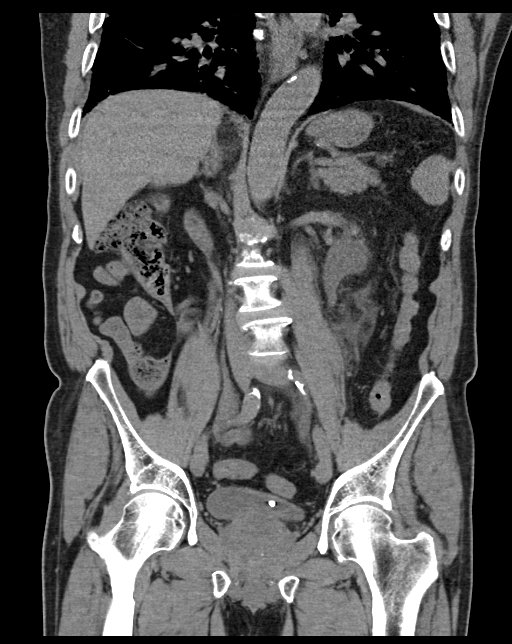

[16 of 46 positions shown; findings below may reference images not displayed]

FINDINGS: Lower chest: Bibasilar interstitial changes. No segmental
consolidation or pleural effusion.

Hepatobiliary: Gallbladder and liver normal appearance

Pancreas: Normal appearance

Spleen: Normal appearance

Adrenals/Urinary Tract: Adrenal glands normal appearance. Multiple
BILATERAL nonobstructing renal calculi. In addition, LEFT
hydronephrosis and hydroureter secondary to a 5 mm LEFT UVJ
calculus. LEFT peripelvic and perinephric edema extending along
proximal LEFT ureter. No RIGHT hydronephrosis or hydroureter.
Bladder otherwise unremarkable.

Stomach/Bowel: Normal appendix. Stomach and bowel loops normal
appearance for technique.

Vascular/Lymphatic: Atherosclerotic calcifications aorta, iliac
arteries, and coronary arteries. Aorta normal caliber. Scattered
pelvic phleboliths. No adenopathy.

Reproductive: Prostatic enlargement gland measuring 6.1 x 4.1 cm
image 89. Seminal vesicles unremarkable.

Other: No free air or free fluid. No hernia or inflammatory process.

Musculoskeletal: Osseous demineralization with facet degenerative
changes in the lumbar spine associated with grade 1
spondylolisthesis and pseudo disc formation at L4-L5.
IMPRESSION: LEFT hydronephrosis and hydroureter secondary to a 5 mm LEFT UPJ
calculus.

Additional BILATERAL nonobstructing renal calculi.

Prostatic enlargement.

Significant atherosclerotic calcifications including coronary
arteries.

## 2019-03-10 ENCOUNTER — Other Ambulatory Visit: Payer: Self-pay | Admitting: Emergency Medicine

## 2019-03-10 ENCOUNTER — Telehealth: Payer: Self-pay | Admitting: Emergency Medicine

## 2019-03-10 DIAGNOSIS — N400 Enlarged prostate without lower urinary tract symptoms: Secondary | ICD-10-CM

## 2019-03-10 MED ORDER — TAMSULOSIN HCL 0.4 MG PO CAPS: 0.4000 mg | ORAL_CAPSULE | Freq: Every day | ORAL | 1 refills | Status: AC

## 2019-03-10 NOTE — Telephone Encounter (Signed)
tamsulosin (FLOMAX) 0.4 MG CAPS capsule [[id number]] ENDED   [place], [place].  Pt requesting med refill

## 2019-03-10 NOTE — Telephone Encounter (Signed)
Refill sent.

## 2019-04-20 ENCOUNTER — Telehealth: Payer: Self-pay | Admitting: Emergency Medicine

## 2019-04-20 NOTE — Telephone Encounter (Signed)
Pt called in to get scheduled for the covid vaccine. Advised pt that we do not schedule for this. He states he was instructed to call [place] and has no other contact information to get himself scheduled. Currently discussing with a more experienced team member on information I can relay to the patient for scheduling

## 2019-05-08 ENCOUNTER — Ambulatory Visit: Payer: Medicare Other | Attending: Internal Medicine

## 2019-05-08 DIAGNOSIS — Z23 Encounter for immunization: Secondary | ICD-10-CM | POA: Insufficient documentation

## 2019-05-08 NOTE — Progress Notes (Signed)
   Covid-19 Vaccination Clinic  Name:  Cashel Bellina    MRN: [id number] DOB: 12-Jan-1941  05/08/2019  Mr. Vinton was observed post Covid-19 immunization for 15 minutes without incidence. He was provided with Vaccine Information Sheet and instruction to access the V-Safe system.   Mr. Niazi was instructed to call 911 with any severe reactions post vaccine: Marland Kitchen Difficulty breathing  . Swelling of your face and throat  . A fast heartbeat  . A bad rash all over your body  . Dizziness and weakness    Immunizations Administered    Name Date Dose VIS Date Route   Pfizer COVID-19 Vaccine 05/08/2019  1:42 PM 0.3 mL 02/20/2019 Intramuscular   Manufacturer: [employer], [employer]   Lot: [id number]   NDC: [address]-0

## 2019-06-03 ENCOUNTER — Ambulatory Visit: Payer: Medicare Other | Attending: Internal Medicine

## 2019-06-03 DIAGNOSIS — Z23 Encounter for immunization: Secondary | ICD-10-CM

## 2019-06-03 NOTE — Progress Notes (Signed)
   Covid-19 Vaccination Clinic  Name:  Levi Phillips    MRN: [id number] DOB: [birth year]  06/03/2019  [name] was observed post Covid-19 immunization for 15 minutes without incident. He was provided with Vaccine Information Sheet and instruction to access the V-Safe system.   [name] was instructed to call 911 with any severe reactions post vaccine: Marland Kitchen Difficulty breathing  . Swelling of face and throat  . A fast heartbeat  . A bad rash all over body  . Dizziness and weakness   Immunizations Administered    Name Date Dose VIS Date Route   Pfizer COVID-19 Vaccine 06/03/2019  1:48 PM 0.3 mL 02/20/2019 Intramuscular   Manufacturer: [employer], [employer]   Lot: [id number]   NDC: [address]-3

## 2019-06-17 ENCOUNTER — Other Ambulatory Visit: Payer: Self-pay

## 2019-06-17 ENCOUNTER — Ambulatory Visit (INDEPENDENT_AMBULATORY_CARE_PROVIDER_SITE_OTHER): Payer: Medicare Other | Admitting: Family Medicine

## 2019-06-17 ENCOUNTER — Encounter: Payer: Self-pay | Admitting: Family Medicine

## 2019-06-17 VITALS — BP 134/81 | HR 76 | Temp 98.0°F | Ht 70.0 in | Wt 182.0 lb

## 2019-06-17 DIAGNOSIS — L299 Pruritus, unspecified: Secondary | ICD-10-CM | POA: Diagnosis not present

## 2019-06-17 DIAGNOSIS — M109 Gout, unspecified: Secondary | ICD-10-CM

## 2019-06-17 DIAGNOSIS — R413 Other amnesia: Secondary | ICD-10-CM | POA: Diagnosis not present

## 2019-06-17 MED ORDER — ALLOPURINOL 100 MG PO TABS: 100.0000 mg | ORAL_TABLET | Freq: Every day | ORAL | 1 refills | Status: DC

## 2019-06-17 MED ORDER — TRIAMCINOLONE ACETONIDE 0.1 % EX CREA: 1.0000 "application " | TOPICAL_CREAM | Freq: Two times a day (BID) | CUTANEOUS | 0 refills | Status: DC

## 2019-06-17 NOTE — Progress Notes (Signed)
Subjective:  Patient ID: Levi Phillips, male    DOB: Apr 20, 1940  Age: 79 y.o. MRN: [id number]  CC:  Chief Complaint  Patient presents with  . Back Pain    pt appt is for Back pain, but Pt states he does some exersizes every morning and this has made his back feel much better. pt states he has some scares on his back that are causing hims some pain he would like a cream or medication for.  . Memory Loss    pt states he has been vary forgetfull lately and would like some medication to possible help with this issue.  this has been going on for the past 2-3 months.  . Medication Refill    pt would like a refill on his allopurinol. pt is requesting this medication be a 7month refill not just one month.    HPI Levi Phillips presents for   concerns above.  Primary care provider Dr. Alvy Bimler.  Refused interpreter - repeated questions with understanding expressed.   Back pain, itching.   Back pain is better with exercises. 90% better. Walking without difficulty. wlaking 1 hour per day.  Itching in lower back at times. Past 1 month. Using hydrocortisone cream about once per day - min relief. Unsure if rash. Same soap with bathing for years.  Some itching in arms at times as well.  Used lotion in past - not using recently.   History of lumbar spondylolisthesis, treated by Dr. Otelia Sergeant with orthopedics in January 2020.  Note reviewed.  L5-S1 disc protrusion on the left with lumbar DDD,  spondylolisthesis L4-5.  activity modification discussed at that time including restrictions with lifting, bending, stooping.  Voltaren gel, for right knee pain.   Memory changes: Forgets where he puts things at times. Past 3-4 months. Using GPS for new locations only. Not getting lost with driving. Not needing list when shopping - able to remember. No difficulty with faces or names.  Lives with wife and daughter. They have not identified any memory concerns.  Independent in ADl/IADL.   6CIT Screen 06/17/2019   What Year? 0 points  What month? 0 points  What time? 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 No known recent flares. No new joint issues.  Daily meds: Allopurinol 100 mg daily. Lab Results  Component Value Date   LABURIC 5.3 02/25/2018      History Patient Active Problem List   Diagnosis Date Noted  . Spinal stenosis of lumbar region 10/15/2017  . Chronic bilateral low back pain with left-sided sciatica 10/15/2017  . History of vertigo 05/09/2017  . History of kidney stones 04/10/2017  . 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 DDD (degenerative disc disease), lumbar 01/09/2017  . Gout 01/09/2017  . BPH (benign prostatic hyperplasia) 01/09/2017  . BMI 27.0-27.9,adult 01/09/2017  Past Medical History:  Diagnosis Date  . Allergy   . Anemia   . Arthritis   . Back pain    spondylolisthesis  . Enlarged prostate    takes Flomax daily  . Gout    takes Allopurinol daily   Past Surgical History:  Procedure Laterality Date  . cataract surgery Bilateral   . EYE SURGERY    . LITHOTRIPSY     done in [place]   No Known Allergies Prior to Admission medications   Medication Sig Start Date End Date Taking? Authorizing Provider  allopurinol (ZYLOPRIM) 100 MG tablet Take 1 tablet (100 mg total) by mouth daily. 12/24/18  Yes Sagardia, Eilleen Kempf, MD  tamsulosin (FLOMAX) 0.4 MG CAPS capsule Take 0.4 mg by mouth daily. 06/15/19   Yes [provider]  Acetaminophen (TYLENOL PO) Take by mouth as needed.    [provider]  diclofenac sodium (VOLTAREN) 1 % GEL Apply 4 g topically 4 (four) times daily. Patient not taking: Reported on 09/15/2018 08/22/18   Kerrin Champagne, MD  gabapentin (NEURONTIN) 100 MG capsule Take 1 capsule (100 mg total) by mouth at bedtime. Patient not taking: Reported on 09/15/2018 04/25/18   Kerrin Champagne, MD  HYDROcodone-acetaminophen (NORCO) 5-325 MG tablet Take 1 tablet by mouth every 6 (six) hours as needed for moderate pain. Patient not taking: Reported on 09/15/2018 03/21/18   Georgina Quint, MD  Pantoprazole Sodium (PROTONIX PO) Take by mouth.    [provider]  traMADol (ULTRAM) 50 MG tablet Take 1 tablet (50 mg total) by mouth every 6 (six) hours as needed. Patient not taking: Reported on 09/15/2018 02/08/18   Shade Flood, MD  traMADol-acetaminophen (ULTRACET) 37.5-325 MG tablet Take 1 tablet by mouth every 6 (six) hours as needed. Patient not taking: Reported on 09/15/2018 05/12/18   Kerrin Champagne, MD   Social History   Socioeconomic History  . Marital status: Married    Spouse name: Not on file  . Number of children: Not on file  . Years of education: Not on file  . Highest education level: Not on file  Occupational History  . Not on file  Tobacco Use  . Smoking status: Never Smoker  . Smokeless tobacco: Never Used  Substance and Sexual Activity  . Alcohol use: Yes    Comment: occ beer  . Drug use: No  . Sexual activity: Not on file  Other Topics Concern  . Not on file  Social History Narrative   ** Merged History Encounter **       Social Determinants of Health   Financial Resource Strain:   . Difficulty of Paying Living Expenses:   Food Insecurity:   . Worried About Programme researcher, broadcasting/film/video in the Last Year:   . Barista in the Last Year:   Transportation Needs:   . Freight forwarder (Medical):   Marland Kitchen Lack of Transportation  (Non-Medical):   Physical Activity:   . Days of Exercise per Week:   . Minutes of Exercise per Session:   Stress:   . Feeling of Stress :   Social Connections:   . Frequency of Communication with Friends and Family:   . Frequency of Social Gatherings with Friends and Family:   . Attends Religious Services:   . Active Member of Clubs or Organizations:   . Attends Banker Meetings:   Marland Kitchen Marital Status:   Intimate Partner Violence:   . Fear of Current  or Ex-Partner:   . Emotionally Abused:   Marland Kitchen Physically Abused:   . Sexually Abused:     Review of Systems Per HPI.   Objective:   Vitals:   06/17/19 0906  BP: 134/81  Pulse: 76  Temp: 98 F (36.7 C)  TempSrc: Temporal  SpO2: 96%  Weight: 182 lb (82.6 kg)  Height: 5\' 10"  (1.778 m)     Physical Exam Vitals reviewed.  Constitutional:      General: He is not in acute distress.    Appearance: He is well-developed.  HENT:     Head: Normocephalic and atraumatic.  Cardiovascular:     Rate and Rhythm: Normal rate.  Pulmonary:     Effort: Pulmonary effort is normal.  Skin:    Comments: Dry skin with a few excoriations across his lower back, no vesicles, no rash.  Minimal dry skin on forearms bilaterally without rash.  Neurological:     Mental Status: He is alert and oriented to person, place, and time.    Assessment & Plan:  Levi Phillips is a 79 y.o. male . Pruritus - Plan: triamcinolone cream (KENALOG) 0.1 %  -Suspect a dry skin component.  Hydrating lotion discussed, dry skin care discussed, triamcinolone if needed with RTC precautions.  Memory difficulty  -Borderline score on MoCA.  Offered neurology eval but he declined at this time.  Would like to continue exercise and other techniques for memory.  Recheck in 6 months with primary care provider, but advised I am happy to refer him to neurology at any point time, especially if any change in symptoms.  Gout, unspecified cause, unspecified chronicity,  unspecified site - Plan: allopurinol (ZYLOPRIM) 100 MG tablet, Uric Acid  -Stable, check uric acid, refilled allopurinol  Meds ordered this encounter  Medications  . allopurinol (ZYLOPRIM) 100 MG tablet    Sig: Take 1 tablet (100 mg total) by mouth daily.    Dispense:  90 tablet    Refill:  1  . triamcinolone cream (KENALOG) 0.1 %    Sig: Apply 1 application topically 2 (two) times daily.    Dispense:  30 g    Refill:  0   Patient Instructions     I'm glad to hear that your back is better. If any worsening pain - please call Dr. 76 office for follow up.   Itching on back and arms is likely due to dry skin. Use aveeno or eucerin lotion to dry skin on back or arms twice per day.  Drink at least 64 ounces of water daily. Consider a humidifier for the room where you sleep. Bathe once daily. Avoid using HOT water, as it dries skin.  Avoid deodorant soaps (Dial is the worst!)  After bathing, dry off completely, then apply Aveeno or Eucerin.  Apply the cream twice daily, or more! Triamcinolone if needed up to twice per day for itching areas.   I refilled allopurinol.  Initial memory test looked okay.  MOCA memory test was borderline but just at the normal range.  I am happy to refer you to the memory specialist/neurologist at any time to discuss your symptoms further.  Just let me know.  If any worsening symptoms I do recommend to be seen right away.  Recheck with Dr. Barbaraann Faster in 6 months.    If you have lab work done today you will be contacted with your lab results within the next 2 weeks.  If you have not heard from Alvy Bimler then please contact us.  The fastest way to get your results is to register for My Chart.   IF you received an x-ray today, you will receive an invoice from [facility]. Please contact [facility] at [phone] with questions or concerns regarding your invoice.   IF you received labwork today, you will receive an invoice from [facility]. Please  contact [facility] at [phone] with questions or concerns regarding your invoice.   Our billing staff will not be able to assist you with questions regarding bills from these companies.  You will be contacted with the lab results as soon as they are available. The fastest way to get your results is to activate your My Chart account. Instructions are located on the last page of this paperwork. If you have not heard from [place] regarding the results in 2 weeks, please contact this office.         Signed, Merri Ray, MD [facility]

## 2019-06-17 NOTE — Patient Instructions (Addendum)
   I'm glad to hear that your back is better. If any worsening pain - please call Dr. Barbaraann Faster office for follow up.   Itching on back and arms is likely due to dry skin. Use aveeno or eucerin lotion to dry skin on back or arms twice per day.  Drink at least 64 ounces of water daily. Consider a humidifier for the room where you sleep. Bathe once daily. Avoid using HOT water, as it dries skin.  Avoid deodorant soaps (Dial is the worst!)  After bathing, dry off completely, then apply Aveeno or Eucerin.  Apply the cream twice daily, or more! Triamcinolone if needed up to twice per day for itching areas.   I refilled allopurinol.  Initial memory test looked okay.  MOCA memory test was borderline but just at the normal range.  I am happy to refer you to the memory specialist/neurologist at any time to discuss your symptoms further.  Just let me know.  If any worsening symptoms I do recommend to be seen right away.  Recheck with Dr. Alvy Bimler in 6 months.    If you have lab work done today you will be contacted with your lab results within the next 2 weeks.  If you have not heard from [place] then please contact us. The fastest way to get your results is to register for My Chart.   IF you received an x-ray today, you will receive an invoice from [facility]. Please contact [facility] at [phone] with questions or concerns regarding your invoice.   IF you received labwork today, you will receive an invoice from [place] [facility]. Please contact [facility] at [phone] with questions or concerns regarding your invoice.   Our billing staff will not be able to assist you with questions regarding bills from these companies.  You will be contacted with the lab results as soon as they are available. The fastest way to get your results is to activate your My Chart account. Instructions are located on the last page of this paperwork. If you have not heard from [place] regarding the results in 2 weeks,  please contact this office.

## 2019-06-18 LAB — URIC ACID: Uric Acid: 5.6 mg/dL (ref 3.8–8.4)

## 2019-06-23 ENCOUNTER — Encounter: Payer: Self-pay | Admitting: Radiology

## 2019-07-31 ENCOUNTER — Other Ambulatory Visit: Payer: Self-pay

## 2019-07-31 ENCOUNTER — Ambulatory Visit (INDEPENDENT_AMBULATORY_CARE_PROVIDER_SITE_OTHER): Payer: Medicare Other | Admitting: Family Medicine

## 2019-07-31 VITALS — BP 128/74 | HR 79 | Temp 98.0°F | Wt 184.6 lb

## 2019-07-31 DIAGNOSIS — R42 Dizziness and giddiness: Secondary | ICD-10-CM

## 2019-07-31 DIAGNOSIS — R011 Cardiac murmur, unspecified: Secondary | ICD-10-CM | POA: Diagnosis not present

## 2019-07-31 DIAGNOSIS — M109 Gout, unspecified: Secondary | ICD-10-CM | POA: Diagnosis not present

## 2019-07-31 MED ORDER — ALLOPURINOL 100 MG PO TABS: 100.0000 mg | ORAL_TABLET | Freq: Every day | ORAL | 3 refills | Status: DC

## 2019-07-31 MED ORDER — MECLIZINE HCL 12.5 MG PO TABS: ORAL_TABLET | ORAL | 1 refills | Status: DC

## 2019-07-31 NOTE — Patient Instructions (Addendum)
We are checking blood tests on you today because of the dizziness and I will let you know the results of those next week.  I have changed your meclizine dose to 12.5 mg.  You can take 2 if necessary, but I think a man your age is safer to take just 1.  If it gives you problems with drowsiness or difficulty urinating discontinue it.  Use it only when necessary for dizziness.  You have a murmur in your heart and I am referring you to Dr. Jacinto Halim and his associates at [facility] [address] [place], [place] [address] [phone] Someone from this office will contact you about when you are to have that appointment.  Return as needed    If you have lab work done today you will be contacted with your lab results within the next 2 weeks.  If you have not heard from [place] then please contact us. The fastest way to get your results is to register for My Chart.   IF you received an x-ray today, you will receive an invoice from [facility]. Please contact [facility] at [phone] with questions or concerns regarding your invoice.   IF you received labwork today, you will receive an invoice from [place]. Please contact [facility] at [phone] with questions or concerns regarding your invoice.   Our billing staff will not be able to assist you with questions regarding bills from these companies.  You will be contacted with the lab results as soon as they are available. The fastest way to get your results is to activate your My Chart account. Instructions are located on the last page of this paperwork. If you have not heard from [place] regarding the results in 2 weeks, please contact this office.

## 2019-07-31 NOTE — Addendum Note (Signed)
Addended by: Trudie Reed A on: 07/31/2019 02:17 PM   Modules accepted: Orders

## 2019-07-31 NOTE — Progress Notes (Signed)
Patient ID: Levi Phillips, male    DOB: Dec 05, 1940  Age: 79 y.o. MRN: [id number]  Chief Complaint  Patient presents with  . Medication Refill    Pt stated that he has ben having some dizzy spells in the morning along with nausea    Subjective:   79 year old gentleman who comes in today with a history of having had an episode of dizziness this morning.  Several years ago he had problems with this and was given some meclizine.  He only had to take couple of them and it went away.  He has done well since then.  He does 45 minutes of exercise every morning.  He has spinal stenosis and was going to have surgery but could not afford it.  Because of that he does the exercises and functions fairly well.  This morning after exercising he had about 15 minutes of dizziness which passed on the way.  His medicine was out of date so he came in to get checked.  Current allergies, medications, problem list, past/family and social histories reviewed.  Objective:  BP 128/74 (BP Location: Right Arm, Patient Position: Sitting, Cuff Size: Normal)   Pulse 79   Temp 98 F (36.7 C) (Temporal)   Wt 184 lb 9.6 oz (83.7 kg)   SpO2 98%   BMI 26.49 kg/m   No major acute distress.  Alert and oriented.  TMs normal.  Eyes PERRL.  Fundi benign.  Throat clear.  Neck supple without nodes or thyromegaly.  He has a grade 3/6 systolic murmur in the right upper sternal border and along the left sternal border.  Heart is regular.  Lungs are clear.  Cranial nerves grossly intact.  Motor strength good.  Gait normal.  Good tandem walk.  Negative Romberg.  Good finger-to-nose.  Assessment & Plan:   Assessment: 1. Dizziness   2. Gout, unspecified cause, unspecified chronicity, unspecified site   3. Undiagnosed cardiac murmurs       Plan: Will give him some more Antivert for as needed use.  Refilled his allopurinol.  Will refer to a cardiologist for evaluation of the murmur to make sure that it is not of concern and it is  not related to the dizziness.  Orders Placed This Encounter  Procedures  . CBC  . Comprehensive metabolic panel  . Ambulatory referral to Cardiology    Referral Priority:   Routine    Referral Type:   Consultation    Referral Reason:   Specialty Services Required    Referred to Provider:   Yates Decamp, MD    Requested Specialty:   Cardiology    Number of Visits Requested:   1    Meds ordered this encounter  Medications  . meclizine (ANTIVERT) 12.5 MG tablet    Sig: Take 1 or 2 pills 3 times daily only when needed for dizziness.    Dispense:  30 tablet    Refill:  1  . allopurinol (ZYLOPRIM) 100 MG tablet    Sig: Take 1 tablet (100 mg total) by mouth daily.    Dispense:  90 tablet    Refill:  3         Patient Instructions   We are checking blood tests on you today because of the dizziness and I will let you know the results of those next week.  I have changed your meclizine dose to 12.5 mg.  You can take 2 if necessary, but I think a man your age is safer to  take just 1.  If it gives you problems with drowsiness or difficulty urinating discontinue it.  Use it only when necessary for dizziness.  You have a murmur in your heart and I am referring you to Dr. Einar Gip and his associates at [facility], [place] [address] [place], [place] [address] [phone] Someone from this office will contact you about when you are to have that appointment.  Return as needed    If you have lab work done today you will be contacted with your lab results within the next 2 weeks.  If you have not heard from [place] then please contact us. The fastest way to get your results is to register for My Chart.   IF you received an x-ray today, you will receive an invoice from [facility]. Please contact [facility] at [phone] with questions or concerns regarding your invoice.   IF you received labwork today, you will receive an invoice from [facility]. Please  contact [facility] at [phone] with questions or concerns regarding your invoice.   Our billing staff will not be able to assist you with questions regarding bills from these companies.  You will be contacted with the lab results as soon as they are available. The fastest way to get your results is to activate your My Chart account. Instructions are located on the last page of this paperwork. If you have not heard from [place] regarding the results in 2 weeks, please contact this office.        Return if symptoms worsen or fail to improve.   Ruben Reason, MD 07/31/2019

## 2019-08-01 LAB — COMPREHENSIVE METABOLIC PANEL
ALT: 19 IU/L (ref 0–44)
AST: 19 IU/L (ref 0–40)
Albumin/Globulin Ratio: 1.7 (ref 1.2–2.2)
Albumin: 4.7 g/dL (ref 3.7–4.7)
Alkaline Phosphatase: 68 IU/L (ref 48–121)
BUN/Creatinine Ratio: 16 (ref 10–24)
BUN: 16 mg/dL (ref 8–27)
Bilirubin Total: 0.5 mg/dL (ref 0.0–1.2)
CO2: 24 mmol/L (ref 20–29)
Calcium: 9.4 mg/dL (ref 8.6–10.2)
Chloride: 101 mmol/L (ref 96–106)
Creatinine, Ser: 0.99 mg/dL (ref 0.76–1.27)
GFR calc Af Amer: 83 mL/min/{1.73_m2} (ref >59)
GFR calc non Af Amer: 72 mL/min/{1.73_m2} (ref >59)
Globulin, Total: 2.7 g/dL (ref 1.5–4.5)
Glucose: 94 mg/dL (ref 65–99)
Potassium: 4.4 mmol/L (ref 3.5–5.2)
Sodium: 138 mmol/L (ref 134–144)
Total Protein: 7.4 g/dL (ref 6.0–8.5)

## 2019-08-01 LAB — CBC
Hematocrit: 43.9 % (ref 37.5–51.0)
Hemoglobin: 15 g/dL (ref 13.0–17.7)
MCH: 29.6 pg (ref 26.6–33.0)
MCHC: 34.2 g/dL (ref 31.5–35.7)
MCV: 87 fL (ref 79–97)
Platelets: 194 10*3/uL (ref 150–450)
RBC: 5.07 x10E6/uL (ref 4.14–5.80)
RDW: 13.3 % (ref 11.6–15.4)
WBC: 7.2 10*3/uL (ref 3.4–10.8)

## 2019-08-05 NOTE — Progress Notes (Signed)
Primary Physician/Referring:  Horald Pollen, MD / Ruben Reason, MD  Patient ID: Levi Phillips, male    DOB: Feb 03, 1941, 79 y.o.   MRN: [id number]  Chief Complaint  Patient presents with  . Heart Murmur    Referred by Dr. Ruben Reason   HPI:    Levi Phillips  is a 79 y.o. male with history of gout, BPV, here for evaluation of heart murmur.  States that he is presently asymptomatic without chest pain or dyspnea, his main chronic problem has been back pain.  States that he has been active and walks at least 1 hour every day. Otherwise no symptoms.   Past Medical History:  Diagnosis Date  . Allergy   . Anemia   . Arthritis   . Back pain    spondylolisthesis  . Enlarged prostate    takes Flomax daily  . Gout    takes Allopurinol daily   Past Surgical History:  Procedure Laterality Date  . cataract surgery Bilateral   . EYE SURGERY    . LITHOTRIPSY     done in [place]   Family History  Problem Relation Age of Onset  . Cancer Mother   . Alzheimer's disease Sister   . Alzheimer's disease Sister     Social History   Tobacco Use  . Smoking status: Never Smoker  . Smokeless tobacco: Never Used  Substance Use Topics  . Alcohol use: Yes    Comment: occ beer   Marital Status: Married  ROS  Review of Systems  Cardiovascular: Negative for dyspnea on exertion, leg swelling and syncope.  Respiratory: Negative for shortness of breath.   Gastrointestinal: Negative for melena.   Objective  Blood pressure 128/76, pulse 78, height 5\' 10"  (1.778 m), weight 184 lb (83.5 kg).  Vitals with BMI 08/07/2019 07/31/2019 06/17/2019  Height 5\' 10"  - 5\' 10"   Weight 184 lbs 184 lbs 10 oz 182 lbs  BMI 82.4 - 23.53  Systolic 614 431 540  Diastolic 76 74 81  Pulse 78 79 76     Physical Exam  Constitutional: He appears well-developed and well-nourished. No distress.  Cardiovascular: Normal rate, regular rhythm, intact distal pulses and normal pulses. Exam reveals no gallop.   Murmur heard.  Harsh crescendo-decrescendo midsystolic murmur is present at the upper right sternal border radiating to the neck. Pulses:      Carotid pulses are on the right side with bruit and on the left side with bruit. No leg edema, no JVD.   Pulmonary/Chest: Effort normal and breath sounds normal. No accessory muscle usage.  Abdominal: Soft. Bowel sounds are normal.   Laboratory examination:   Recent Labs    07/31/19 1127  NA 138  K 4.4  CL 101  CO2 24  GLUCOSE 94  BUN 16  CREATININE 0.99  CALCIUM 9.4  GFRNONAA 72  GFRAA 83   estimated creatinine clearance is 62.5 mL/min (by C-G formula based on SCr of 0.99 mg/dL).  CMP Latest Ref Rng & Units 07/31/2019 02/25/2018 05/09/2017  Glucose 65 - 99 mg/dL 94 104(H) 93  BUN 8 - 27 mg/dL 16 15 16   Creatinine 0.76 - 1.27 mg/dL 0.99 1.04 1.02  Sodium 134 - 144 mmol/L 138 138 139  Potassium 3.5 - 5.2 mmol/L 4.4 3.9 4.3  Chloride 96 - 106 mmol/L 101 100 102  CO2 20 - 29 mmol/L 24 23 19(L)  Calcium 8.6 - 10.2 mg/dL 9.4 9.5 9.8  Total Protein 6.0 - 8.5 g/dL 7.4 7.2 7.6  Total Bilirubin 0.0 - 1.2 mg/dL 0.5 0.6 0.5  Alkaline Phos 48 - 121 IU/L 68 68 65  AST 0 - 40 IU/L 19 14 22   ALT 0 - 44 IU/L 19 12 15    CBC Latest Ref Rng & Units 07/31/2019 02/25/2018 04/10/2017  WBC 3.4 - 10.8 x10E3/uL 7.2 7.3 9.9  Hemoglobin 13.0 - 17.7 g/dL 02/27/2018 04/12/2017 67.2  Hematocrit 37.5 - 51.0 % 43.9 42.0 40.4  Platelets 150 - 450 x10E3/uL 194 201 185   Lipid Panel  No results found for: CHOL, TRIG, HDL, CHOLHDL, VLDL, LDLCALC, LDLDIRECT HEMOGLOBIN A1C No results found for: HGBA1C, MPG TSH No results for input(s): TSH in the last 8760 hours.  Medications and allergies  No Known Allergies   Current Outpatient Medications  Medication Instructions  . allopurinol (ZYLOPRIM) 100 mg, Oral, Daily  . diclofenac sodium (VOLTAREN) 4 g, Topical, 4 times daily  . gabapentin (NEURONTIN) 100 mg, Oral, Daily at bedtime  . meclizine (ANTIVERT) 12.5 MG tablet  Take 1 or 2 pills 3 times daily only when needed for dizziness.  . Pantoprazole Sodium (PROTONIX PO) Oral  . tamsulosin (FLOMAX) 0.4 mg, Oral, Daily  . traMADol (ULTRAM) 50 mg, Oral, Every 6 hours PRN   Radiology:   No results found.  Cardiac Studies:   None  EKG  None    Assessment     ICD-10-CM   1. Nonrheumatic aortic valve stenosis  I35.0 Lipid Panel With LDL/HDL Ratio    PCV ECHOCARDIOGRAM COMPLETE  2. Bilateral carotid bruits  R09.89 PCV CAROTID DUPLEX (BILATERAL)     No orders of the defined types were placed in this encounter.   There are no discontinued medications.  Recommendations:   Levi Phillips  is a 79 y.o. male with history of gout, BPV, here for evaluation of heart murmur.  States that he is presently asymptomatic without chest pain or dyspnea, his main chronic problem has been chronic back pain.  States that he has been active and walks at least 1 hour every day.  His physical examination is consistent with mild if not moderate aortic stenosis, needs echocardiogram, also needs carotid artery duplex due to bruit.  I will obtain lipid profile testing. He needs EKG to establish a baseline but due to technical issues related to electronic medical records, EKG was not performed.  I will do this on his next office visit.  I did review all his external records and labs, normal renal function and hepatic function and normal CBC.  Office visit following the tests, patient is planning on a trip to [place] near [place].  Advised him that these tests can certainly wait.  If he is unable to perform them now, will be performed the next 2 to 3 months.  76, MD, [facility] 08/09/2019, 12:06 PM [facility] [place] Pager: [phone] Office: [phone]

## 2019-08-06 ENCOUNTER — Other Ambulatory Visit: Payer: Self-pay

## 2019-08-06 ENCOUNTER — Ambulatory Visit: Payer: Medicare Other | Admitting: Cardiology

## 2019-08-06 ENCOUNTER — Encounter: Payer: Self-pay | Admitting: Radiology

## 2019-08-06 VITALS — BP 128/76 | HR 78 | Ht 70.0 in | Wt 184.0 lb

## 2019-08-06 DIAGNOSIS — R0989 Other specified symptoms and signs involving the circulatory and respiratory systems: Secondary | ICD-10-CM

## 2019-08-06 DIAGNOSIS — I350 Nonrheumatic aortic (valve) stenosis: Secondary | ICD-10-CM

## 2019-08-09 ENCOUNTER — Encounter: Payer: Self-pay | Admitting: Cardiology

## 2019-08-11 DIAGNOSIS — N200 Calculus of kidney: Secondary | ICD-10-CM | POA: Diagnosis not present

## 2019-08-14 ENCOUNTER — Telehealth: Payer: Self-pay | Admitting: Emergency Medicine

## 2019-08-14 DIAGNOSIS — M109 Gout, unspecified: Secondary | ICD-10-CM

## 2019-08-14 MED ORDER — ALLOPURINOL 100 MG PO TABS: 100.0000 mg | ORAL_TABLET | Freq: Every day | ORAL | 1 refills | Status: DC

## 2019-08-14 MED ORDER — TAMSULOSIN HCL 0.4 MG PO CAPS: 0.4000 mg | ORAL_CAPSULE | Freq: Every day | ORAL | 1 refills | Status: DC

## 2019-08-14 NOTE — Telephone Encounter (Signed)
Pt called and is needing these medications in 3 month supply. [place] that is why pt is requesting these medications. Pt would like a call when they are sent in. [phone] Please advise.  tamsulosin (FLOMAX) 0.4 MG CAPS capsule [[id number]]   allopurinol (ZYLOPRIM) 100 MG tablet [[id number]]    [facility] - [place], [place] - [address]

## 2019-08-14 NOTE — Telephone Encounter (Signed)
Pt called returning office call from message below. Please advise.

## 2019-08-14 NOTE — Telephone Encounter (Signed)
I have sent in the rx for a 90 day supply and attempted to call pt to let him know. No answer so I left a message to call back.

## 2019-08-14 NOTE — Telephone Encounter (Signed)
Called pt and informed him that his medication is at the pharmacy.

## 2019-08-17 DIAGNOSIS — N200 Calculus of kidney: Secondary | ICD-10-CM | POA: Diagnosis not present

## 2020-01-14 ENCOUNTER — Telehealth: Payer: Self-pay | Admitting: Emergency Medicine

## 2020-01-14 ENCOUNTER — Other Ambulatory Visit: Payer: Self-pay

## 2020-01-14 DIAGNOSIS — M109 Gout, unspecified: Secondary | ICD-10-CM

## 2020-01-14 MED ORDER — ALLOPURINOL 100 MG PO TABS: 100.0000 mg | ORAL_TABLET | Freq: Every day | ORAL | 1 refills | Status: DC

## 2020-01-14 MED ORDER — TAMSULOSIN HCL 0.4 MG PO CAPS: 0.4000 mg | ORAL_CAPSULE | Freq: Every day | ORAL | 1 refills | Status: DC

## 2020-01-14 NOTE — Telephone Encounter (Signed)
What is the name of the medication? allopurinol (ZYLOPRIM) 100 MG tablet [[id number]] and tamsulosin (FLOMAX) 0.4 MG CAPS capsule [[id number]]   Have you contacted your pharmacy to request a refill? Yes he needs a new script for these refills. He would like a curtesy refill until his cpe appointment 02/26/20.  Which pharmacy would you like this sent to? Pharmacy  [facility] [address], [place] - [address]  [address] [place] [address]  Phone:  [phone] Fax:  [phone]       Patient notified that their request is being sent to the clinical staff for review and that they should receive a call once it is complete. If they do not receive a call within 72 hours they can check with their pharmacy or our office.

## 2020-01-14 NOTE — Telephone Encounter (Signed)
Sent!

## 2020-02-26 ENCOUNTER — Encounter: Payer: Self-pay | Admitting: Family Medicine

## 2020-02-26 ENCOUNTER — Ambulatory Visit (INDEPENDENT_AMBULATORY_CARE_PROVIDER_SITE_OTHER): Payer: Medicare Other | Admitting: Family Medicine

## 2020-02-26 ENCOUNTER — Other Ambulatory Visit: Payer: Self-pay

## 2020-02-26 ENCOUNTER — Ambulatory Visit (INDEPENDENT_AMBULATORY_CARE_PROVIDER_SITE_OTHER): Payer: Medicare Other

## 2020-02-26 VITALS — BP 114/64 | HR 61 | Temp 98.0°F | Ht 70.0 in | Wt 173.8 lb

## 2020-02-26 DIAGNOSIS — Z23 Encounter for immunization: Secondary | ICD-10-CM | POA: Diagnosis not present

## 2020-02-26 DIAGNOSIS — M109 Gout, unspecified: Secondary | ICD-10-CM

## 2020-02-26 DIAGNOSIS — R059 Cough, unspecified: Secondary | ICD-10-CM

## 2020-02-26 DIAGNOSIS — Z87438 Personal history of other diseases of male genital organs: Secondary | ICD-10-CM

## 2020-02-26 DIAGNOSIS — Z0001 Encounter for general adult medical examination with abnormal findings: Secondary | ICD-10-CM

## 2020-02-26 DIAGNOSIS — R0989 Other specified symptoms and signs involving the circulatory and respiratory systems: Secondary | ICD-10-CM

## 2020-02-26 DIAGNOSIS — E790 Hyperuricemia without signs of inflammatory arthritis and tophaceous disease: Secondary | ICD-10-CM | POA: Diagnosis not present

## 2020-02-26 DIAGNOSIS — R1013 Epigastric pain: Secondary | ICD-10-CM | POA: Diagnosis not present

## 2020-02-26 MED ORDER — PANTOPRAZOLE SODIUM 40 MG PO TBEC: 40.0000 mg | DELAYED_RELEASE_TABLET | Freq: Every day | ORAL | 0 refills | Status: DC

## 2020-02-26 MED ORDER — ALLOPURINOL 100 MG PO TABS
100.0000 mg | ORAL_TABLET | Freq: Every day | ORAL | 1 refills | Status: DC
Start: 2020-02-26 — End: 2020-06-30

## 2020-02-26 MED ORDER — TAMSULOSIN HCL 0.4 MG PO CAPS
0.4000 mg | ORAL_CAPSULE | Freq: Every day | ORAL | 1 refills | Status: DC
Start: 2020-02-26 — End: 2020-06-30

## 2020-02-26 NOTE — Patient Instructions (Addendum)
    I am not sure why you are having the choking sensation.  I am going to put you on an acid lowering medication for a couple of weeks to try and keep stomach acid from irritating up into your chest and throat.  If the sensation persists please come back or go to an urgent care or the emergency room if necessary.  I do not think you necessarily need to stay on the allopurinol.  Your uric acid level has run good.  When your labs come back from today if it is still good, I would like you to just discontinue the allopurinol and see if you have any problems.  If you have any swollen painful joints it could be from gout from the uric acid, and you should let us know.  It is uncertain whether you need to be on the tamsulosin.  This is a medicine that allows your prostate gland to urinate more freely.  It sounds like you do not have any urinary difficulty.  I would suggest you try stopping it for a couple of weeks.  If you notice you develop more problems with having to pee in the night or having more difficulty peeing, then I would recommend you resume taking it.  Continue  getting your regular exercise.  I recommend you go ahead and get your 3rd Covid vaccination (booster).  You can get it at a [facility] or [facility].  Return in 1 year or as needed   If you have lab work done today you will be contacted with your lab results within the next 2 weeks.  If you have not heard from [place] then please contact us. The fastest way to get your results is to register for My Chart.   IF you received an x-ray today, you will receive an invoice from [facility]. Please contact [facility] at [phone] with questions or concerns regarding your invoice.   IF you received labwork today, you will receive an invoice from [place]. Please contact [facility] at [phone] with questions or concerns regarding your invoice.   Our billing staff will not be able to assist you with questions regarding bills from  these companies.  You will be contacted with the lab results as soon as they are available. The fastest way to get your results is to activate your My Chart account. Instructions are located on the last page of this paperwork. If you have not heard from [place] regarding the results in 2 weeks, please contact this office.

## 2020-02-26 NOTE — Progress Notes (Signed)
Patient ID: Levi Phillips, male    DOB: [birth year]  Age: 79 y.o. MRN: [id number]  Chief Complaint  Patient presents with  . Annual Exam    CPE  . something stuck in throat     Going on for 2 days and can be indigestion     Subjective:   79 year old gentleman who is here for a checkup but has several problems he wants me to address also.  He complains of the sensation of something stuck in his throat for the past 2 days.  Does not know of anything getting stuck but feels like things do not want to go down and he keeps coughing to try and clear his throat and chest.  No fever or other illness.  He has been on uric acid lowering medicine for about 15 years since he was told his level was high and he needed to take it.  He also has been on tamsulosin for general principles though he does not recall being told he had a major problems and he does not complain of any urinary difficulties.  He has 1-2 time nocturia.  He has chronic back problems, and was told he needed surgery.  Then another doctor told him he did not need surgery, just exercise.  He faithfully does an hour of exercise every morning and has done well for a number of years with that.  Past medical history: Operations: Cataracts Medical illnesses: None Regular medications: Tamsulosin and allopurinol Medication allergies: None  Social history: From [place], has been in [place] for about 15 years.  He has 2 sons and [place] and one grandchild there, and 1 daughter and his wife here in the [place] with him.  His wife and daughter are [place] citizens but he chose to stay here on his green card.  He stays active.  Family history: No major familial diseases  Review of systems: Constitutional: Unremarkable HEENT: Unremarkable Respiratory: Cough from the foreign body sensation Cardiovascular: Unremarkable GI: Has had some dyspepsia GU: Unremarkable.  1-2 time nocturia.   MusculoSkeletal: Unremarkable Neurologic: Unremarkable Dermatologic:  Unremarkable Psychiatric: Unremarkable    Current allergies, medications, problem list, past/family and social histories reviewed.  Objective:  BP 114/64   Pulse 61   Temp 98 F (36.7 C) (Temporal)   Ht 5' 10"  (1.778 m)   Wt 173 lb 12.8 oz (78.8 kg)   SpO2 92%   BMI 24.94 kg/m   Healthy-appearing man in no acute distress.  Coughing constantly.  TMs normal.  Eyes PERRL.  Has shimmer of his lens implants.  Throat clear.  Neck supple without nodes or thyromegaly.  No carotid bruits.  Chest clear.  Heart rate without murmur.  Abdomen soft without mass but has slight epigastric tenderness.  Normal male external genitalia.  Extremities unremarkable.  No axillary or inguinal nodes.  Assessment & Plan:   Assessment: 1. Sensation of foreign body in throat   2. Cough   3. Dyspepsia   4. Elevated blood uric acid level   5. History of BPH       Plan: Flu shot.  See instructions.  Orders Placed This Encounter  Procedures  . DG Chest 2 View    Standing Status:   Future    Number of Occurrences:   1    Standing Expiration Date:   02/25/2021    Order Specific Question:   Reason for Exam (SYMPTOM  OR DIAGNOSIS REQUIRED)    Answer:   foreign body sensation, cough  Order Specific Question:   Preferred imaging location?    Answer:   External  . CMP14+EGFR  . Uric acid  . CBC    Meds ordered this encounter  Medications  . pantoprazole (PROTONIX) 40 MG tablet    Sig: Take 1 tablet (40 mg total) by mouth daily.    Dispense:  15 tablet    Refill:  0         Patient Instructions      I am not sure why you are having the choking sensation.  I am going to put you on an acid lowering medication for a couple of weeks to try and keep stomach acid from irritating up into your chest and throat.  If the sensation persists please come back or go to an urgent care or the emergency room if necessary.  I do not think you necessarily need to stay on the allopurinol.  Your uric acid  level has run good.  When your labs come back from today if it is still good, I would like you to just discontinue the allopurinol and see if you have any problems.  If you have any swollen painful joints it could be from gout from the uric acid, and you should let us know.  It is uncertain whether you need to be on the tamsulosin.  This is a medicine that allows your prostate gland to urinate more freely.  It sounds like you do not have any urinary difficulty.  I would suggest you try stopping it for a couple of weeks.  If you notice you develop more problems with having to pee in the night or having more difficulty peeing, then I would recommend you resume taking it.  Continue  getting your regular exercise.  I recommend you go ahead and get your 3rd Covid vaccination (booster).  You can get it at a [facility] or [facility].  Return in 1 year or as needed   If you have lab work done today you will be contacted with your lab results within the next 2 weeks.  If you have not heard from [place] then please contact us. The fastest way to get your results is to register for My Chart.   IF you received an x-ray today, you will receive an invoice from [facility]. Please contact [facility] at [phone] with questions or concerns regarding your invoice.   IF you received labwork today, you will receive an invoice from [place]. Please contact [facility] at [phone] with questions or concerns regarding your invoice.   Our billing staff will not be able to assist you with questions regarding bills from these companies.  You will be contacted with the lab results as soon as they are available. The fastest way to get your results is to activate your My Chart account. Instructions are located on the last page of this paperwork. If you have not heard from [place] regarding the results in 2 weeks, please contact this office.         Return in about 1 year (around 02/25/2021).   Ruben Reason,  MD 02/26/2020

## 2020-02-27 LAB — CBC
Hematocrit: 47 % (ref 37.5–51.0)
Hemoglobin: 15.4 g/dL (ref 13.0–17.7)
MCH: 29.2 pg (ref 26.6–33.0)
MCHC: 32.8 g/dL (ref 31.5–35.7)
MCV: 89 fL (ref 79–97)
Platelets: 220 10*3/uL (ref 150–450)
RBC: 5.27 x10E6/uL (ref 4.14–5.80)
RDW: 12.9 % (ref 11.6–15.4)
WBC: 7.6 10*3/uL (ref 3.4–10.8)

## 2020-02-27 LAB — CMP14+EGFR
ALT: 18 IU/L (ref 0–44)
AST: 21 IU/L (ref 0–40)
Albumin/Globulin Ratio: 1.6 (ref 1.2–2.2)
Albumin: 4.8 g/dL — ABNORMAL HIGH (ref 3.7–4.7)
Alkaline Phosphatase: 92 IU/L (ref 44–121)
BUN/Creatinine Ratio: 14 (ref 10–24)
BUN: 15 mg/dL (ref 8–27)
Bilirubin Total: 0.6 mg/dL (ref 0.0–1.2)
CO2: 23 mmol/L (ref 20–29)
Calcium: 9.4 mg/dL (ref 8.6–10.2)
Chloride: 101 mmol/L (ref 96–106)
Creatinine, Ser: 1.07 mg/dL (ref 0.76–1.27)
GFR calc Af Amer: 75 mL/min/{1.73_m2} (ref >59)
GFR calc non Af Amer: 65 mL/min/{1.73_m2} (ref >59)
Globulin, Total: 3 g/dL (ref 1.5–4.5)
Glucose: 92 mg/dL (ref 65–99)
Potassium: 4.3 mmol/L (ref 3.5–5.2)
Sodium: 143 mmol/L (ref 134–144)
Total Protein: 7.8 g/dL (ref 6.0–8.5)

## 2020-02-27 LAB — URIC ACID: Uric Acid: 5.8 mg/dL (ref 3.8–8.4)

## 2020-02-29 ENCOUNTER — Telehealth: Payer: Self-pay | Admitting: Emergency Medicine

## 2020-02-29 NOTE — Telephone Encounter (Signed)
Pt called to ask if he needed to wait to have Covid booster or ok to get it now / pt just had flu shot last Friday/ checked with Clinical. Ok to get it now per clinical

## 2020-03-18 ENCOUNTER — Ambulatory Visit: Payer: Medicare Other | Attending: Internal Medicine

## 2020-03-18 DIAGNOSIS — Z23 Encounter for immunization: Secondary | ICD-10-CM

## 2020-03-18 NOTE — Progress Notes (Signed)
   Covid-19 Vaccination Clinic  Name:  Levi Phillips    MRN: [id number] DOB: [birth year]  03/18/2020  Levi Phillips was observed post Covid-19 immunization for 15 minutes without incident. He was provided with Vaccine Information Sheet and instruction to access the V-Safe system.   Levi Phillips was instructed to call 911 with any severe reactions post vaccine: Marland Kitchen Difficulty breathing  . Swelling of face and throat  . A fast heartbeat  . A bad rash all over body  . Dizziness and weakness   Immunizations Administered    Name Date Dose VIS Date Route   Pfizer COVID-19 Vaccine 03/18/2020  1:40 PM 0.3 mL 12/30/2019 Intramuscular   Manufacturer: [employer], [employer]   Lot: [id number]   NDC: [address]-3

## 2020-04-12 ENCOUNTER — Telehealth: Payer: Self-pay | Admitting: Emergency Medicine

## 2020-04-12 NOTE — Telephone Encounter (Signed)
Call to pharmacy- verified 90 days supply- they will fill them for pick up. Attempted to call patient- no answer and no voce mail set up.

## 2020-04-12 NOTE — Telephone Encounter (Signed)
Medication Refill - Medication: Allipurionol, tamsulosin   Has the patient contacted their pharmacy? Yes.   Pt states that he usually gets 90 day supply, but pharmacy only has one month supply on file. Please advise.  (Agent: If no, request that the patient contact the pharmacy for the refill.) (Agent: If yes, when and what did the pharmacy advise?)  Preferred Pharmacy (with phone number or street name):  [facility] [address] [place] - [address]  [address] [place] [address]  Phone: [phone] Fax: [phone]  Hours: Not open 24 hours     Agent: Please be advised that RX refills may take up to 3 business days. We ask that you follow-up with your pharmacy.

## 2020-06-02 ENCOUNTER — Telehealth: Payer: Self-pay | Admitting: Emergency Medicine

## 2020-06-02 NOTE — Telephone Encounter (Signed)
06/02/2020 - PATIENT CALLED ON Wednesday (06/01/2020) TO ASK DR. MIGUEL TO GIVE HIM THE NAME OF THE KIDNEY DOCTOR HE REFERRED HIM TO BEFORE. HE SAID HE HAS BEEN OVER SEAS AND NOW HE'S BACK BUT HE DOES NOT REMEMBER THE DOCTOR'S NAME. I TOLD HIM HE WOULD PROBABLY HAVE TO HAVE AN APPOINTMENT FIRST BUT HE WANTED [place] TO ASK DR. MIGUEL. BEST PHONE [phone] (CELL) MBC

## 2020-06-02 NOTE — Telephone Encounter (Signed)
Patient called again to request the number of the nephrologist he saw a few years ago for kidney stones. Please advise at [phone].

## 2020-06-02 NOTE — Telephone Encounter (Signed)
We did not refer to Neph no records. Called pt no answer no VM

## 2020-06-07 DIAGNOSIS — N200 Calculus of kidney: Secondary | ICD-10-CM | POA: Diagnosis not present

## 2020-06-29 ENCOUNTER — Telehealth: Payer: Self-pay | Admitting: Emergency Medicine

## 2020-06-29 NOTE — Telephone Encounter (Signed)
allopurinol (ZYLOPRIM) 100 MG tablet tamsulosin (FLOMAX) 0.4 MG CAPS capsule  [facility] - [place], [place] - [address] Phone:  [phone]  Fax:  [phone]      last seen- 12.17.21 Next apt- 12.20.22

## 2020-06-30 ENCOUNTER — Other Ambulatory Visit: Payer: Self-pay

## 2020-06-30 DIAGNOSIS — M109 Gout, unspecified: Secondary | ICD-10-CM

## 2020-06-30 DIAGNOSIS — Z87438 Personal history of other diseases of male genital organs: Secondary | ICD-10-CM

## 2020-06-30 MED ORDER — ALLOPURINOL 100 MG PO TABS: 100.0000 mg | ORAL_TABLET | Freq: Every day | ORAL | 1 refills | Status: DC

## 2020-06-30 MED ORDER — TAMSULOSIN HCL 0.4 MG PO CAPS: 0.4000 mg | ORAL_CAPSULE | Freq: Every day | ORAL | 1 refills | Status: DC

## 2020-06-30 NOTE — Telephone Encounter (Signed)
Refilled Allopurinol and Tamsulosin.

## 2020-08-02 ENCOUNTER — Encounter: Payer: Self-pay | Admitting: Emergency Medicine

## 2020-08-02 ENCOUNTER — Ambulatory Visit (INDEPENDENT_AMBULATORY_CARE_PROVIDER_SITE_OTHER): Payer: Medicare Other | Admitting: Emergency Medicine

## 2020-08-02 ENCOUNTER — Other Ambulatory Visit: Payer: Self-pay

## 2020-08-02 VITALS — BP 142/88 | HR 68 | Temp 98.5°F | Ht 70.0 in | Wt 182.0 lb

## 2020-08-02 DIAGNOSIS — F418 Other specified anxiety disorders: Secondary | ICD-10-CM

## 2020-08-02 DIAGNOSIS — Z87442 Personal history of urinary calculi: Secondary | ICD-10-CM

## 2020-08-02 MED ORDER — ALPRAZOLAM 0.25 MG PO TABS: 0.2500 mg | ORAL_TABLET | Freq: Two times a day (BID) | ORAL | 1 refills | Status: DC | PRN

## 2020-08-02 NOTE — Patient Instructions (Signed)
Health Maintenance After Age 81 After age 81, you are at a higher risk for certain long-term diseases and infections as well as injuries from falls. Falls are a major cause of broken bones and head injuries in people who are older than age 81. Getting regular preventive care can help to keep you healthy and well. Preventive care includes getting regular testing and making lifestyle changes as recommended by your health care provider. Talk with your health care provider about:  Which screenings and tests you should have. A screening is a test that checks for a disease when you have no symptoms.  A diet and exercise plan that is right for you. What should I know about screenings and tests to prevent falls? Screening and testing are the best ways to find a health problem early. Early diagnosis and treatment give you the best chance of managing medical conditions that are common after age 81. Certain conditions and lifestyle choices may make you more likely to have a fall. Your health care provider may recommend:  Regular vision checks. Poor vision and conditions such as cataracts can make you more likely to have a fall. If you wear glasses, make sure to get your prescription updated if your vision changes.  Medicine review. Work with your health care provider to regularly review all of the medicines you are taking, including over-the-counter medicines. Ask your health care provider about any side effects that may make you more likely to have a fall. Tell your health care provider if any medicines that you take make you feel dizzy or sleepy.  Osteoporosis screening. Osteoporosis is a condition that causes the bones to get weaker. This can make the bones weak and cause them to break more easily.  Blood pressure screening. Blood pressure changes and medicines to control blood pressure can make you feel dizzy.  Strength and balance checks. Your health care provider may recommend certain tests to check your  strength and balance while standing, walking, or changing positions.  Foot health exam. Foot pain and numbness, as well as not wearing proper footwear, can make you more likely to have a fall.  Depression screening. You may be more likely to have a fall if you have a fear of falling, feel emotionally low, or feel unable to do activities that you used to do.  Alcohol use screening. Using too much alcohol can affect your balance and may make you more likely to have a fall. What actions can I take to lower my risk of falls? General instructions  Talk with your health care provider about your risks for falling. Tell your health care provider if: ? You fall. Be sure to tell your health care provider about all falls, even ones that seem minor. ? You feel dizzy, sleepy, or off-balance.  Take over-the-counter and prescription medicines only as told by your health care provider. These include any supplements.  Eat a healthy diet and maintain a healthy weight. A healthy diet includes low-fat dairy products, low-fat (lean) meats, and fiber from whole grains, beans, and lots of fruits and vegetables. Home safety  Remove any tripping hazards, such as rugs, cords, and clutter.  Install safety equipment such as grab bars in bathrooms and safety rails on stairs.  Keep rooms and walkways well-lit. Activity  Follow a regular exercise program to stay fit. This will help you maintain your balance. Ask your health care provider what types of exercise are appropriate for you.  If you need a cane or walker,   use it as recommended by your health care provider.  Wear supportive shoes that have nonskid soles.   Lifestyle  Do not drink alcohol if your health care provider tells you not to drink.  If you drink alcohol, limit how much you have: ? 0-1 drink a day for women. ? 0-2 drinks a day for men.  Be aware of how much alcohol is in your drink. In the [place] one drink equals one typical bottle of beer (12  oz), one-half glass of wine (5 oz), or one shot of hard liquor (1 oz).  Do not use any products that contain nicotine or tobacco, such as cigarettes and e-cigarettes. If you need help quitting, ask your health care provider. Summary  Having a healthy lifestyle and getting preventive care can help to protect your health and wellness after age 81.  Screening and testing are the best way to find a health problem early and help you avoid having a fall. Early diagnosis and treatment give you the best chance for managing medical conditions that are more common for people who are older than age 81.  Falls are a major cause of broken bones and head injuries in people who are older than age 81. Take precautions to prevent a fall at home.  Work with your health care provider to learn what changes you can make to improve your health and wellness and to prevent falls. This information is not intended to replace advice given to you by your health care provider. Make sure you discuss any questions you have with your health care provider. Document Revised: 06/19/2018 Document Reviewed: 01/09/2017 Elsevier Patient Education  2021 Elsevier Inc.  

## 2020-08-02 NOTE — Assessment & Plan Note (Signed)
[facility] report reviewed with patient.  Stable bilateral small kidney stones.  Asymptomatic.

## 2020-08-02 NOTE — Progress Notes (Signed)
Levi Phillips 80 y.o.   Chief Complaint  Patient presents with  . Nephrolithiasis    Pt kidney stones a few months ago, he would like to know what to do to avoid getting them again. He would like to review his lab report from urology.    HISTORY OF PRESENT ILLNESS: This is a 80 y.o. male with history of kidney stones here to review some of the urology findings.  [facility].  Has report with him.  Office visit report and findings reviewed with patient. Also complaining of situational anxiety related to daughter's situation.  Requesting something for intermittent stress and anxiety. No other complaints or medical concerns today.  HPI   Prior to Admission medications   Medication Sig Start Date End Date Taking? Authorizing Provider  allopurinol (ZYLOPRIM) 100 MG tablet Take 1 tablet (100 mg total) by mouth daily. 06/30/20  Yes Adisynn Suleiman, Eilleen Kempf, MD  tamsulosin (FLOMAX) 0.4 MG CAPS capsule Take 1 capsule (0.4 mg total) by mouth daily. 06/30/20  Yes Sherwin Hollingshed, Eilleen Kempf, MD  pantoprazole (PROTONIX) 40 MG tablet Take 1 tablet (40 mg total) by mouth daily. Patient not taking: Reported on 08/02/2020 02/26/20   Peyton Najjar, MD    No Known Allergies  Patient Active Problem List   Diagnosis Date Noted  . Spinal stenosis of lumbar region 10/15/2017  . Chronic bilateral low back pain with left-sided sciatica 10/15/2017  . History of vertigo 05/09/2017  . History of kidney stones 04/10/2017  . 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 DDD (degenerative disc disease), lumbar 01/09/2017  . Gout 01/09/2017  . BPH (benign prostatic hyperplasia) 01/09/2017  . BMI 27.0-27.9,adult 01/09/2017    Past Medical History:  Diagnosis Date  . Allergy   . Anemia   . Arthritis   . Back pain    spondylolisthesis  . Enlarged prostate    takes Flomax daily  . Gout    takes Allopurinol daily    Past Surgical History:  Procedure Laterality Date  . cataract surgery  Bilateral   . EYE SURGERY    . LITHOTRIPSY     done in [place]    Social History   Socioeconomic History  . Marital status: Married    Spouse name: Not on file  . Number of children: Not on file  . Years of education: Not on file  . Highest education level: Not on file  Occupational History  . Not on file  Tobacco Use  . Smoking status: Never Smoker  . Smokeless tobacco: Never Used  Vaping Use  . Vaping Use: Never used  Substance and Sexual Activity  . Alcohol use: Yes    Comment: occ beer  . Drug use: No  . Sexual activity: Not on file  Other Topics Concern  . Not on file  Social History Narrative   ** Merged History Encounter **       Social Determinants of Health   Financial Resource Strain: Not on file  Food Insecurity: Not on file  Transportation Needs: Not on file  Physical Activity: Not on file  Stress: Not on file  Social Connections: Not on file  Intimate Partner Violence: Not on file    Family History  Problem Relation Age of Onset  . Cancer Mother   . Alzheimer's disease Sister   . Alzheimer's disease Sister      Review of Systems  Constitutional: Negative.  Negative for chills and fever.  HENT: Negative.  Negative  for congestion and sore throat.   Respiratory: Negative.  Negative for cough and shortness of breath.   Cardiovascular: Negative.  Negative for chest pain and palpitations.  Gastrointestinal: Negative.  Negative for abdominal pain, diarrhea, nausea and vomiting.  Genitourinary: Negative.  Negative for dysuria and hematuria.  Skin: Negative.  Negative for rash.  Neurological: Negative.  Negative for dizziness and headaches.  Psychiatric/Behavioral: The patient is nervous/anxious and has insomnia.   All other systems reviewed and are negative.    Physical Exam Vitals reviewed.  Constitutional:      Appearance: Normal appearance.  HENT:     Head: Normocephalic.  Eyes:     Extraocular Movements: Extraocular movements intact.      Conjunctiva/sclera: Conjunctivae normal.     Pupils: Pupils are equal, round, and reactive to light.  Cardiovascular:     Rate and Rhythm: Normal rate and regular rhythm.     Pulses: Normal pulses.     Heart sounds: Normal heart sounds.  Pulmonary:     Effort: Pulmonary effort is normal.     Breath sounds: Normal breath sounds.  Abdominal:     Palpations: Abdomen is soft.     Tenderness: There is no abdominal tenderness.  Musculoskeletal:        General: Normal range of motion.     Cervical back: Normal range of motion and neck supple.  Skin:    General: Skin is warm and dry.     Capillary Refill: Capillary refill takes less than 2 seconds.  Neurological:     General: No focal deficit present.     Mental Status: He is alert and oriented to person, place, and time.  Psychiatric:        Mood and Affect: Mood normal.        Behavior: Behavior normal.      ASSESSMENT & PLAN:  Situational anxiety Patient able to identify trigger. May benefit from intermittent use of low-dose Xanax 0.25 mg as needed.  Advised on proper use of benzodiazepine.  History of kidney stones [facility] report reviewed with patient.  Stable bilateral small kidney stones.  Asymptomatic.   Kaitlin was seen today for nephrolithiasis.  Diagnoses and all orders for this visit:  Situational anxiety -     ALPRAZolam (XANAX) 0.25 MG tablet; Take 1 tablet (0.25 mg total) by mouth 2 (two) times daily as needed for anxiety.  History of kidney stones    Patient Instructions   Health Maintenance After Age 29 After age 36, you are at a higher risk for certain long-term diseases and infections as well as injuries from falls. Falls are a major cause of broken bones and head injuries in people who are older than age 64. Getting regular preventive care can help to keep you healthy and well. Preventive care includes getting regular testing and making lifestyle changes as recommended by your health care  provider. Talk with your health care provider about:  Which screenings and tests you should have. A screening is a test that checks for a disease when you have no symptoms.  A diet and exercise plan that is right for you. What should I know about screenings and tests to prevent falls? Screening and testing are the best ways to find a health problem early. Early diagnosis and treatment give you the best chance of managing medical conditions that are common after age 78. Certain conditions and lifestyle choices may make you more likely to have a fall. Your health care provider  may recommend:  Regular vision checks. Poor vision and conditions such as cataracts can make you more likely to have a fall. If you wear glasses, make sure to get your prescription updated if your vision changes.  Medicine review. Work with your health care provider to regularly review all of the medicines you are taking, including over-the-counter medicines. Ask your health care provider about any side effects that may make you more likely to have a fall. Tell your health care provider if any medicines that you take make you feel dizzy or sleepy.  Osteoporosis screening. Osteoporosis is a condition that causes the bones to get weaker. This can make the bones weak and cause them to break more easily.  Blood pressure screening. Blood pressure changes and medicines to control blood pressure can make you feel dizzy.  Strength and balance checks. Your health care provider may recommend certain tests to check your strength and balance while standing, walking, or changing positions.  Foot health exam. Foot pain and numbness, as well as not wearing proper footwear, can make you more likely to have a fall.  Depression screening. You may be more likely to have a fall if you have a fear of falling, feel emotionally low, or feel unable to do activities that you used to do.  Alcohol use screening. Using too much alcohol can affect your  balance and may make you more likely to have a fall. What actions can I take to lower my risk of falls? General instructions  Talk with your health care provider about your risks for falling. Tell your health care provider if: ? You fall. Be sure to tell your health care provider about all falls, even ones that seem minor. ? You feel dizzy, sleepy, or off-balance.  Take over-the-counter and prescription medicines only as told by your health care provider. These include any supplements.  Eat a healthy diet and maintain a healthy weight. A healthy diet includes low-fat dairy products, low-fat (lean) meats, and fiber from whole grains, beans, and lots of fruits and vegetables. Home safety  Remove any tripping hazards, such as rugs, cords, and clutter.  Install safety equipment such as grab bars in bathrooms and safety rails on stairs.  Keep rooms and walkways well-lit. Activity  Follow a regular exercise program to stay fit. This will help you maintain your balance. Ask your health care provider what types of exercise are appropriate for you.  If you need a cane or walker, use it as recommended by your health care provider.  Wear supportive shoes that have nonskid soles.   Lifestyle  Do not drink alcohol if your health care provider tells you not to drink.  If you drink alcohol, limit how much you have: ? 0-1 drink a day for women. ? 0-2 drinks a day for men.  Be aware of how much alcohol is in your drink. In the [place] one drink equals one typical bottle of beer (12 oz), one-half glass of wine (5 oz), or one shot of hard liquor (1 oz).  Do not use any products that contain nicotine or tobacco, such as cigarettes and e-cigarettes. If you need help quitting, ask your health care provider. Summary  Having a healthy lifestyle and getting preventive care can help to protect your health and wellness after age 34.  Screening and testing are the best way to find a health problem early  and help you avoid having a fall. Early diagnosis and treatment give you the best chance for  managing medical conditions that are more common for people who are older than age 86.  Falls are a major cause of broken bones and head injuries in people who are older than age 30. Take precautions to prevent a fall at home.  Work with your health care provider to learn what changes you can make to improve your health and wellness and to prevent falls. This information is not intended to replace advice given to you by your health care provider. Make sure you discuss any questions you have with your health care provider. Document Revised: 06/19/2018 Document Reviewed: 01/09/2017 Elsevier Patient Education  2021 Elsevier Inc.       Edwina Barth, MD [facility]

## 2020-08-02 NOTE — Assessment & Plan Note (Signed)
Patient able to identify trigger. May benefit from intermittent use of low-dose Xanax 0.25 mg as needed.  Advised on proper use of benzodiazepine.

## 2020-08-22 ENCOUNTER — Emergency Department (HOSPITAL_COMMUNITY): Payer: Medicare Other

## 2020-08-22 ENCOUNTER — Emergency Department (HOSPITAL_COMMUNITY)
Admission: EM | Admit: 2020-08-22 | Discharge: 2020-08-23 | Disposition: A | Discharge status: Home / Self Care | Payer: Medicare Other | Attending: Emergency Medicine | Admitting: Emergency Medicine

## 2020-08-22 DIAGNOSIS — R0789 Other chest pain: Secondary | ICD-10-CM | POA: Diagnosis not present

## 2020-08-22 DIAGNOSIS — R072 Precordial pain: Secondary | ICD-10-CM | POA: Diagnosis present

## 2020-08-22 LAB — CBC WITH DIFFERENTIAL/PLATELET
Abs Immature Granulocytes: 0.07 10*3/uL (ref 0.00–0.07)
Basophils Absolute: 0 10*3/uL (ref 0.0–0.1)
Basophils Relative: 0 %
Eosinophils Absolute: 0.5 10*3/uL (ref 0.0–0.5)
Eosinophils Relative: 6 %
HCT: 42.1 % (ref 39.0–52.0)
Hemoglobin: 14.1 g/dL (ref 13.0–17.0)
Immature Granulocytes: 1 %
Lymphocytes Relative: 32 %
Lymphs Abs: 2.7 10*3/uL (ref 0.7–4.0)
MCH: 29.7 pg (ref 26.0–34.0)
MCHC: 33.5 g/dL (ref 30.0–36.0)
MCV: 88.8 fL (ref 80.0–100.0)
Monocytes Absolute: 0.8 10*3/uL (ref 0.1–1.0)
Monocytes Relative: 9 %
Neutro Abs: 4.3 10*3/uL (ref 1.7–7.7)
Neutrophils Relative %: 52 %
Platelets: 201 10*3/uL (ref 150–400)
RBC: 4.74 MIL/uL (ref 4.22–5.81)
RDW: 13.2 % (ref 11.5–15.5)
WBC: 8.3 10*3/uL (ref 4.0–10.5)
nRBC: 0 % (ref 0.0–0.2)

## 2020-08-22 LAB — COMPREHENSIVE METABOLIC PANEL
ALT: 19 U/L (ref 0–44)
AST: 19 U/L (ref 15–41)
Albumin: 4.3 g/dL (ref 3.5–5.0)
Alkaline Phosphatase: 54 U/L (ref 38–126)
Anion gap: 7 (ref 5–15)
BUN: 14 mg/dL (ref 8–23)
CO2: 27 mmol/L (ref 22–32)
Calcium: 8.9 mg/dL (ref 8.9–10.3)
Chloride: 101 mmol/L (ref 98–111)
Creatinine, Ser: 1.08 mg/dL (ref 0.61–1.24)
GFR, Estimated: >60 mL/min (ref >60)
Glucose, Bld: 105 mg/dL — ABNORMAL HIGH (ref 70–99)
Potassium: 4.1 mmol/L (ref 3.5–5.1)
Sodium: 135 mmol/L (ref 135–145)
Total Bilirubin: 0.6 mg/dL (ref 0.3–1.2)
Total Protein: 7.9 g/dL (ref 6.5–8.1)

## 2020-08-22 LAB — TROPONIN I (HIGH SENSITIVITY): Troponin I (High Sensitivity): 3 ng/L (ref <18)

## 2020-08-22 NOTE — ED Provider Notes (Signed)
Emergency Medicine Provider Triage Evaluation Note  Saber Dickerman , a 80 y.o. male  was evaluated in triage.  Pt complains of substernal chest pain.  Started at 1pm today.  Doesn't radiate or move.  No back pain.  Pain worsens with chest wall palpation and deep breathing. .  Review of Systems  Positive: Chest pain Negative: Shob,   Physical Exam  BP 107/73 (BP Location: Left Arm)   Pulse 69   Temp 98.3 F (36.8 C) (Oral)   Resp 16   SpO2 95%  Gen:   Awake, no distress   Resp:  Normal effort, CTAB MSK:   Moves extremities without difficulty  Other:  RRR, no MGR  Medical Decision Making  Medically screening exam initiated at 10:26 PM.  Appropriate orders placed.  Jebadiah Bircher was informed that the remainder of the evaluation will be completed by another provider, this initial triage assessment does not replace that evaluation, and the importance of remaining in the ED until their evaluation is complete.     Norman Clay 08/22/20 2227    Benjiman Core, MD 08/22/20 [phone]

## 2020-08-22 NOTE — ED Triage Notes (Signed)
Pt came in with c/o chest pain L side that is worse with inspiration. Started at 1300 this morning

## 2020-08-23 ENCOUNTER — Other Ambulatory Visit: Payer: Self-pay

## 2020-08-23 ENCOUNTER — Emergency Department (HOSPITAL_COMMUNITY)
Admission: EM | Admit: 2020-08-23 | Discharge: 2020-08-23 | Disposition: A | Discharge status: Home / Self Care | Payer: Medicare Other | Source: Home / Self Care | Attending: Emergency Medicine | Admitting: Emergency Medicine

## 2020-08-23 ENCOUNTER — Encounter (HOSPITAL_COMMUNITY): Payer: Self-pay

## 2020-08-23 DIAGNOSIS — R0789 Other chest pain: Secondary | ICD-10-CM

## 2020-08-23 NOTE — ED Notes (Addendum)
Patient refusing blood work and states he had blood work yesterday and wants to speak with a doctor.   Patient states the only thing he wants is a print off of his results from yesterday.

## 2020-08-23 NOTE — ED Provider Notes (Signed)
Emergency Medicine Provider Triage Evaluation Note  Levi Phillips , a 80 y.o. male  was evaluated in triage.  Pt complains of chest pain onset yesterday, came to the ED yesterday however left after extended wait.  Reports pain is midsternal to left side of chest, yesterday pain was worse with taking a deep breath, today's pain is worse with exhaling.  Reports mild associated shortness of breath, denies diaphoresis or nausea.  No cardiac history, takes medication for blood pressure, no history of diabetes or hyperlipidemia.  Pain is worse with exertion, improves with rest.  Review of Systems  Positive: Chest pain, shortness of breath Negative: Nausea, diaphoresis  Physical Exam  BP 128/78 (BP Location: Left Arm)   Pulse 74   Temp 98.7 F (37.1 C) (Oral)   Resp 16   Ht 5\' 10"  (1.778 m)   Wt 82.6 kg   SpO2 100%   BMI 26.11 kg/m  Gen:   Awake, no distress   Resp:  Normal effort  MSK:   Moves extremities without difficulty  Other:  No chest wall tenderness  Medical Decision Making  Medically screening exam initiated at 1:43 PM.  Appropriate orders placed.  Levi Phillips was informed that the remainder of the evaluation will be completed by another provider, this initial triage assessment does not replace that evaluation, and the importance of remaining in the ED until their evaluation is complete.     , PA-C 08/23/20 1344    08/25/20, MD 08/24/20 [phone]

## 2020-08-23 NOTE — ED Triage Notes (Signed)
Patient c/o left chest pain that worsens when he breathes in. Patient states chest pain began yesterday at 1300 today. Patient was seen in the ED last night for the same. Patient left after screening was done.

## 2020-08-23 NOTE — Discharge Instructions (Addendum)
The testing yesterday and today are reassuring.  There is no sign that you are having a heart attack or problems in your lungs, causing this pain.  To help the discomfort, avoid doing arm exercises for 2 weeks.  Also, for pain, use Tylenol 650 mg every 4 hours if needed.  Sometimes it helps use a heating pad on these types of problems.  Apply that for 30 minutes 3-4 times a day.  See your doctor as needed for problems.  We are giving you copies of the testing which has been done.

## 2020-08-23 NOTE — ED Provider Notes (Signed)
[facility]-EMERGENCY DEPT Provider Note   CSN: [id number] Arrival date & time: 08/23/20  1258     History Chief Complaint  Patient presents with   Chest Pain    Levi Phillips is a 80 y.o. male.  HPI He presents for evaluation of left anterior chest pain which started 2 days ago, and is gradually improving.  He came to the ED last night, was screened and had labs and x-ray done but left prior to full evaluation.  He is here today to get his results.  He is not concerned about worsening discomfort.  He denies associated symptoms including shortness of breath, fever, chills, cough, weakness or dizziness.  He states that he exercises regularly, both walking and using an exercise band, for strengthening of the upper extremities.  No prior cardiac disease.  No other known trauma.  No other sicknesses recently.  He states he return here just to get the results from yesterday, and is not seeking additional evaluation.  There are no other known active modifying factors.  Past Medical History:  Diagnosis Date   Allergy    Anemia    Arthritis    Back pain    spondylolisthesis   Enlarged prostate    takes Flomax daily   Gout    takes Allopurinol daily    Patient Active Problem List   Diagnosis Date Noted   Situational anxiety 08/02/2020   Spinal stenosis of lumbar region 10/15/2017   Chronic bilateral low back pain with left-sided sciatica 10/15/2017   History of vertigo 05/09/2017   History of kidney stones 04/10/2017   Renal colic on left side 04/10/2017   DDD (degenerative disc disease), lumbar 01/09/2017   Gout 01/09/2017   BPH (benign prostatic hyperplasia) 01/09/2017   BMI 27.0-27.9,adult 01/09/2017    Past Surgical History:  Procedure Laterality Date   cataract surgery Bilateral    EYE SURGERY     LITHOTRIPSY     done in [place]       Family History  Problem Relation Age of Onset   Cancer Mother    Alzheimer's disease Sister     Alzheimer's disease Sister     Social History   Tobacco Use   Smoking status: Never   Smokeless tobacco: Never  Vaping Use   Vaping Use: Never used  Substance Use Topics   Alcohol use: Yes    Comment: occ beer   Drug use: No    Home Medications Prior to Admission medications   Medication Sig Start Date End Date Taking? Authorizing Provider  allopurinol (ZYLOPRIM) 100 MG tablet Take 1 tablet (100 mg total) by mouth daily. 06/30/20   Georgina Quint, MD  ALPRAZolam Prudy Feeler) 0.25 MG tablet Take 1 tablet (0.25 mg total) by mouth 2 (two) times daily as needed for anxiety. 08/02/20   Georgina Quint, MD  pantoprazole (PROTONIX) 40 MG tablet Take 1 tablet (40 mg total) by mouth daily. Patient not taking: Reported on 08/02/2020 02/26/20   Peyton Najjar, MD  tamsulosin (FLOMAX) 0.4 MG CAPS capsule Take 1 capsule (0.4 mg total) by mouth daily. 06/30/20   Georgina Quint, MD    Allergies    Patient has no known allergies.  Review of Systems   Review of Systems  All other systems reviewed and are negative.  Physical Exam Updated Vital Signs BP 128/78 (BP Location: Left Arm)   Pulse 74   Temp 98.7 F (37.1 C) (Oral)   Resp 16  Ht 5\' 10"  (1.778 m)   Wt 82.6 kg   SpO2 100%   BMI 26.11 kg/m   Physical Exam Vitals and nursing note reviewed.  Constitutional:      Appearance: He is well-developed. He is not ill-appearing.  HENT:     Head: Normocephalic and atraumatic.     Right Ear: External ear normal.     Left Ear: External ear normal.     Mouth/Throat:     Mouth: Mucous membranes are moist.     Pharynx: No oropharyngeal exudate or posterior oropharyngeal erythema.  Eyes:     Conjunctiva/sclera: Conjunctivae normal.     Pupils: Pupils are equal, round, and reactive to light.  Neck:     Trachea: Phonation normal.  Cardiovascular:     Rate and Rhythm: Normal rate.  Pulmonary:     Effort: Pulmonary effort is normal. No respiratory distress.     Breath  sounds: Normal breath sounds. No stridor.  Chest:     Chest wall: Tenderness (Very minor left mid anterior chest pain with palpation, without crepitation or deformity.) present.  Abdominal:     General: There is no distension.     Palpations: Abdomen is soft.     Tenderness: There is no abdominal tenderness.  Musculoskeletal:        General: Normal range of motion.     Cervical back: Normal range of motion and neck supple.  Skin:    General: Skin is warm and dry.  Neurological:     Mental Status: He is alert and oriented to person, place, and time.     Cranial Nerves: No cranial nerve deficit.     Sensory: No sensory deficit.     Motor: No abnormal muscle tone.     Coordination: Coordination normal.  Psychiatric:        Behavior: Behavior normal.        Thought Content: Thought content normal.        Judgment: Judgment normal.    ED Results / Procedures / Treatments   Labs (all labs ordered are listed, but only abnormal results are displayed) Labs Reviewed  TROPONIN I (HIGH SENSITIVITY)    EKG EKG Interpretation  Date/Time:  Tuesday August 23 2020 13:38:32 EDT Ventricular Rate:  78 PR Interval:  146 QRS Duration: 82 QT Interval:  377 QTC Calculation: 430 R Axis:   37 Text Interpretation: Sinus rhythm Probable left atrial enlargement Borderline T abnormalities, inferior leads Baseline wander in lead(s) V1 since last tracing no significant change Confirmed by [birth year]20-1984 [phone]) on 08/23/2020 2:43:00 PM  Radiology DG Chest 2 View  Result Date: 08/22/2020 CLINICAL DATA:  Chest pain, worse with inspiration which began at 1300 hours EXAM: CHEST - 2 VIEW COMPARISON:  Radiograph 02/26/2020 FINDINGS: Diffusely coarsened interstitial and bronchitic changes present throughout the lungs with more focal scarring towards the lung bases. Lung volumes are diminished as well which extends to a D opacities towards the lung bases. No clear acute focal consolidation, pneumothorax or  effusion. Stable cardiomediastinal contours with a calcified aorta. No acute osseous or soft tissue abnormality. IMPRESSION: Coarsened interstitial and bronchitic changes with bilateral basilar scarring likely accentuated by low volumes and atelectasis. No other clear acute cardiopulmonary abnormality is seen. Aortic Atherosclerosis (ICD10-I70.0). Electronically Signed   By: 02/28/2020 M.D.   On: 08/22/2020 22:49    Procedures Procedures   Medications Ordered in ED Medications - No data to display  ED Course  I have reviewed the triage  vital signs and the nursing notes.  Pertinent labs & imaging results that were available during my care of the patient were reviewed by me and considered in my medical decision making (see chart for details).    MDM Rules/Calculators/A&P                           Patient Vitals for the past 24 hrs:  BP Temp Temp src Pulse Resp SpO2 Height Weight  08/23/20 1339 128/78 98.7 F (37.1 C) Oral 74 16 100 % -- --  08/23/20 1338 -- -- -- -- -- -- 5\' 10"  (1.778 m) 82.6 kg    2:48 PM Reevaluation with update and discussion. After initial assessment and treatment, an updated evaluation reveals he remains comfortable and has no further complaints.  Findings discussed and questions answered.   Medical Decision Making:  This patient is presenting for evaluation of chest pain, which does require a range of treatment options, and is a complaint that involves a moderate risk of morbidity and mortality. The differential diagnoses include chest wall pain, ACS, intrathoracic process. I decided to review old records, and in summary previously healthy elderly male, presenting with nonspecific pain.  Chronic illnesses include prostatic hyperplasia, kidney stones, spinal stenosis.  He exercises regularly including doing upper extremity workouts..  I did not require additional historical information from anyone.  Critical Interventions-clinical evaluation,  discussion with patient  After These Interventions, the Patient was reevaluated and was found stable for discharge.  Patient with chest wall pain, likely related to her upper extremity exercising.  Doubt ACS, PE or pneumonia.  No indication for further ED evaluation at this time.  He does not require a second troponin.  I reviewed the EMR results from yesterday.  Including ECG imaging.  CRITICAL CARE-no Performed by: Mancel Bale  Nursing Notes Reviewed/ Care Coordinated Applicable Imaging Reviewed Interpretation of Laboratory Data incorporated into ED treatment  The patient appears reasonably screened and/or stabilized for discharge and I doubt any other medical condition or other [facility] requiring further screening, evaluation, or treatment in the ED at this time prior to discharge.  Plan: Home Medications-Tylenol for pain; Home Treatments-heat to affected area; return here if the recommended treatment, does not improve the symptoms; Recommended follow up-PCP, PRN     Final Clinical Impression(s) / ED Diagnoses Final diagnoses:  Chest wall pain    Rx / DC Orders ED Discharge Orders     None        [facility], MD 08/23/20 1449

## 2020-09-29 ENCOUNTER — Telehealth: Payer: Self-pay | Admitting: Emergency Medicine

## 2020-09-29 NOTE — Telephone Encounter (Signed)
Pt would like a call back to discuss his medication list and refill that are needed

## 2020-09-30 ENCOUNTER — Other Ambulatory Visit: Payer: Self-pay

## 2020-09-30 DIAGNOSIS — Z87438 Personal history of other diseases of male genital organs: Secondary | ICD-10-CM

## 2020-09-30 DIAGNOSIS — M109 Gout, unspecified: Secondary | ICD-10-CM

## 2020-09-30 MED ORDER — TAMSULOSIN HCL 0.4 MG PO CAPS: 0.4000 mg | ORAL_CAPSULE | Freq: Every day | ORAL | 1 refills | Status: DC

## 2020-09-30 MED ORDER — ALLOPURINOL 100 MG PO TABS: 100.0000 mg | ORAL_TABLET | Freq: Every day | ORAL | 1 refills | Status: DC

## 2020-09-30 NOTE — Telephone Encounter (Signed)
Pt called stating he needed refills on tamsulosin and allopurinol. Medication refilled.

## 2020-09-30 NOTE — Telephone Encounter (Signed)
Called and spoke with pt to verify medication and refill.

## 2020-10-25 ENCOUNTER — Emergency Department (HOSPITAL_COMMUNITY): Payer: Medicare Other

## 2020-10-25 ENCOUNTER — Other Ambulatory Visit: Payer: Self-pay

## 2020-10-25 ENCOUNTER — Encounter (HOSPITAL_COMMUNITY): Payer: Self-pay | Admitting: Emergency Medicine

## 2020-10-25 ENCOUNTER — Emergency Department (HOSPITAL_COMMUNITY)
Admission: EM | Admit: 2020-10-25 | Discharge: 2020-10-25 | Disposition: A | Discharge status: Home / Self Care | Payer: Medicare Other | Attending: Emergency Medicine | Admitting: Emergency Medicine

## 2020-10-25 DIAGNOSIS — M545 Low back pain, unspecified: Secondary | ICD-10-CM | POA: Diagnosis present

## 2020-10-25 DIAGNOSIS — M48061 Spinal stenosis, lumbar region without neurogenic claudication: Secondary | ICD-10-CM | POA: Diagnosis not present

## 2020-10-25 DIAGNOSIS — M4800 Spinal stenosis, site unspecified: Secondary | ICD-10-CM

## 2020-10-25 DIAGNOSIS — M25552 Pain in left hip: Secondary | ICD-10-CM

## 2020-10-25 LAB — CBC WITH DIFFERENTIAL/PLATELET
Abs Immature Granulocytes: 0.09 10*3/uL — ABNORMAL HIGH (ref 0.00–0.07)
Basophils Absolute: 0 10*3/uL (ref 0.0–0.1)
Basophils Relative: 1 %
Eosinophils Absolute: 0.4 10*3/uL (ref 0.0–0.5)
Eosinophils Relative: 5 %
HCT: 45.3 % (ref 39.0–52.0)
Hemoglobin: 15.3 g/dL (ref 13.0–17.0)
Immature Granulocytes: 1 %
Lymphocytes Relative: 30 %
Lymphs Abs: 2.7 10*3/uL (ref 0.7–4.0)
MCH: 29.4 pg (ref 26.0–34.0)
MCHC: 33.8 g/dL (ref 30.0–36.0)
MCV: 87.1 fL (ref 80.0–100.0)
Monocytes Absolute: 0.8 10*3/uL (ref 0.1–1.0)
Monocytes Relative: 9 %
Neutro Abs: 4.8 10*3/uL (ref 1.7–7.7)
Neutrophils Relative %: 54 %
Platelets: 218 10*3/uL (ref 150–400)
RBC: 5.2 MIL/uL (ref 4.22–5.81)
RDW: 13.2 % (ref 11.5–15.5)
WBC: 8.8 10*3/uL (ref 4.0–10.5)
nRBC: 0 % (ref 0.0–0.2)

## 2020-10-25 LAB — URINALYSIS, ROUTINE W REFLEX MICROSCOPIC
Bacteria, UA: NONE SEEN
Bilirubin Urine: NEGATIVE
Glucose, UA: NEGATIVE mg/dL
Ketones, ur: NEGATIVE mg/dL
Leukocytes,Ua: NEGATIVE
Nitrite: NEGATIVE
Protein, ur: NEGATIVE mg/dL
Specific Gravity, Urine: 1.011 (ref 1.005–1.030)
pH: 8 (ref 5.0–8.0)

## 2020-10-25 LAB — BASIC METABOLIC PANEL
Anion gap: 12 (ref 5–15)
BUN: 12 mg/dL (ref 8–23)
CO2: 23 mmol/L (ref 22–32)
Calcium: 9.4 mg/dL (ref 8.9–10.3)
Chloride: 99 mmol/L (ref 98–111)
Creatinine, Ser: 0.88 mg/dL (ref 0.61–1.24)
GFR, Estimated: >60 mL/min (ref >60)
Glucose, Bld: 106 mg/dL — ABNORMAL HIGH (ref 70–99)
Potassium: 4.3 mmol/L (ref 3.5–5.1)
Sodium: 134 mmol/L — ABNORMAL LOW (ref 135–145)

## 2020-10-25 MED ORDER — OXYCODONE-ACETAMINOPHEN 5-325 MG PO TABS
1.0000 | ORAL_TABLET | Freq: Once | ORAL | Status: AC
Start: 2020-10-25 — End: 2020-10-25
  Administered 2020-10-25: 1 via ORAL
  Filled 2020-10-25: qty 1

## 2020-10-25 MED ORDER — DICLOFENAC SODIUM 1 % EX GEL: 4.0000 g | Freq: Four times a day (QID) | CUTANEOUS | 0 refills | Status: DC

## 2020-10-25 MED ORDER — PREDNISONE 10 MG PO TABS: 20.0000 mg | ORAL_TABLET | Freq: Every day | ORAL | 0 refills | Status: AC

## 2020-10-25 MED ORDER — ACETAMINOPHEN 325 MG PO TABS: 650.0000 mg | ORAL_TABLET | Freq: Four times a day (QID) | ORAL | Status: DC | PRN

## 2020-10-25 MED ORDER — GABAPENTIN 100 MG PO CAPS: 100.0000 mg | ORAL_CAPSULE | Freq: Two times a day (BID) | ORAL | 0 refills | Status: DC

## 2020-10-25 NOTE — ED Provider Notes (Signed)
Emergency Medicine Provider Triage Evaluation Note  Levi Phillips , a 80 y.o. male  was evaluated in triage.  Pt complains of back pain.  The patient has history of back pain for several months, but pain worsened 3 days ago.  Pain continued to worsen over the last 24 hours.  Pain is not so severe that he can barely ambulate.  It radiates down his left leg.  No numbness, weakness, urinary fecal incontinence, abdominal pain, dizziness, lightheadedness, shortness of breath.  Review of Systems  Positive: Back pain, leg pain, arthralgias, myalgias Negative: Urinary or fecal incontinence, abdominal pain, dizziness, lightheadedness, shortness of breath, numbness, weakness  Physical Exam  BP (!) 146/81 (BP Location: Left Arm)   Pulse 77   Temp 98 F (36.7 C)   Resp 17   SpO2 95%  Gen:   Awake, no distress   Resp:  Normal effort  MSK:   Moves extremities without difficulty  Other:  Tender to palpation over the left SI joint into the left hip.  He is able to stand, but appears uncomfortable.  No weakness noted in the legs.  Medical Decision Making  Medically screening exam initiated at 5:37 AM.  Appropriate orders placed.  Sanjay Gravely was informed that the remainder of the evaluation will be completed by another provider, this initial triage assessment does not replace that evaluation, and the importance of remaining in the ED until their evaluation is complete.  Percocet given in the ED for pain control.  He will require further work-up and evaluation in the emergency department.  Labs and imaging have been ordered.   Barkley Boards, PA-C 10/25/20 0537    Glynn Octave, MD 10/25/20 [phone]

## 2020-10-25 NOTE — ED Triage Notes (Signed)
Per EMS, pt from home c/o left hip pain.  Pain is chronic but worse tonight.  Pt was ambulatory on scene but did require some assistance.    130 palpated 70 pulse 24 RR 99% RA

## 2020-10-25 NOTE — ED Provider Notes (Signed)
[facility] EMERGENCY DEPARTMENT Provider Note   CSN: [id number] Arrival date & time: 10/25/20  0440     History Chief Complaint  Patient presents with   Hip Pain    Levi Phillips is a 80 y.o. male.  HPI Levi Phillips is a 80 y.o. male with a significant medical history of degenerative disc disease, spinal stenosis, and left-sided sciatica presenting to the ED for worsening back pain. He states he had similar symptoms 2 years ago and he was subsequently diagnosed with spinal stenosis and was recommended surgery by Dr. Otelia Sergeant. He did not undergo this surgery due to him not having insurance, and was unable to afford it. He instead was doing physical therapy and was consistent with his at home exercises with significant relief in his pain. He states he pain got much worse last night to the point he could not sleep and called EMS early this morning.   He tried Tylenol at home with no relief. He states his pain is in his lower left back and radiates down his left leg. He denies any inciting injury or trauma. He rates his pain a 9/10 last night but has improved to a 6/10 after receiving Percocet. He denies any numbness, IVDU, fever/chills, weakness, saddle anesthesia, trouble ambulating, and incontinence.      Past Medical History:  Diagnosis Date   Allergy    Anemia    Arthritis    Back pain    spondylolisthesis   Enlarged prostate    takes Flomax daily   Gout    takes Allopurinol daily    Patient Active Problem List   Diagnosis Date Noted   Situational anxiety 08/02/2020   Spinal stenosis of lumbar region 10/15/2017   Chronic bilateral low back pain with left-sided sciatica 10/15/2017   History of vertigo 05/09/2017   History of kidney stones 04/10/2017   Renal colic on left side 04/10/2017   DDD (degenerative disc disease), lumbar 01/09/2017   Gout 01/09/2017   BPH (benign prostatic hyperplasia) 01/09/2017   BMI 27.0-27.9,adult 01/09/2017    Past  Surgical History:  Procedure Laterality Date   cataract surgery Bilateral    EYE SURGERY     LITHOTRIPSY     done in [place]       Family History  Problem Relation Age of Onset   Cancer Mother    Alzheimer's disease Sister    Alzheimer's disease Sister     Social History   Tobacco Use   Smoking status: Never   Smokeless tobacco: Never  Vaping Use   Vaping Use: Never used  Substance Use Topics   Alcohol use: Yes    Comment: occ beer   Drug use: No    Home Medications Prior to Admission medications   Medication Sig Start Date End Date Taking? Authorizing Provider  diclofenac Sodium (VOLTAREN) 1 % GEL Apply 4 g topically 4 (four) times daily. 10/25/20  Yes Mordechai Matuszak S, PA  gabapentin (NEURONTIN) 100 MG capsule Take 1 capsule (100 mg total) by mouth 2 (two) times daily for 7 days. 10/25/20 11/01/20 Yes Cherika Jessie S, PA  predniSONE (DELTASONE) 10 MG tablet Take 2 tablets (20 mg total) by mouth daily for 5 days. 10/25/20 10/30/20 Yes Corinda Ammon S, PA  allopurinol (ZYLOPRIM) 100 MG tablet Take 1 tablet (100 mg total) by mouth daily. 09/30/20   Georgina Quint, MD  ALPRAZolam Prudy Feeler) 0.25 MG tablet Take 1 tablet (0.25 mg total) by mouth 2 (two) times daily  as needed for anxiety. 08/02/20   Georgina Quint, MD  pantoprazole (PROTONIX) 40 MG tablet Take 1 tablet (40 mg total) by mouth daily. Patient not taking: Reported on 08/02/2020 02/26/20   Peyton Najjar, MD  tamsulosin (FLOMAX) 0.4 MG CAPS capsule Take 1 capsule (0.4 mg total) by mouth daily. 09/30/20   Georgina Quint, MD    Allergies    Patient has no known allergies.  Review of Systems   Review of Systems  Constitutional:  Negative for chills and fever.  HENT:  Negative for congestion.   Eyes:  Negative for pain.  Respiratory:  Negative for cough and shortness of breath.   Cardiovascular:  Negative for chest pain and leg swelling.  Gastrointestinal:  Negative for abdominal pain and  vomiting.  Genitourinary:  Negative for dysuria.  Musculoskeletal:  Negative for myalgias and neck pain.       Low back / left hip pain  Skin:  Negative for rash.  Neurological:  Negative for dizziness and headaches.   Physical Exam Updated Vital Signs BP (!) 142/87   Pulse 80   Temp 98 F (36.7 C) (Oral)   Resp 16   SpO2 99%   Physical Exam Vitals and nursing note reviewed.  Constitutional:      General: He is not in acute distress. HENT:     Head: Normocephalic and atraumatic.     Nose: Nose normal.  Eyes:     General: No scleral icterus. Cardiovascular:     Rate and Rhythm: Normal rate and regular rhythm.     Pulses: Normal pulses.     Heart sounds: Normal heart sounds.  Pulmonary:     Effort: Pulmonary effort is normal. No respiratory distress.     Breath sounds: No wheezing.  Abdominal:     Palpations: Abdomen is soft.     Tenderness: There is no abdominal tenderness.  Musculoskeletal:     Cervical back: Normal range of motion.     Right lower leg: No edema.     Left lower leg: No edema.     Comments: Patient is ambulatory without abnormality.  Able to do squats at bedside.  Strength 5/5 in all extremities.  Some diffuse left hip gluteal and lumbar tenderness to palpation.  No step-off deformity or bruising.  Skin:    General: Skin is warm and dry.     Capillary Refill: Capillary refill takes less than 2 seconds.  Neurological:     Mental Status: He is alert. Mental status is at baseline.  Psychiatric:        Mood and Affect: Mood normal.        Behavior: Behavior normal.    ED Results / Procedures / Treatments   Labs (all labs ordered are listed, but only abnormal results are displayed) Labs Reviewed  CBC WITH DIFFERENTIAL/PLATELET - Abnormal; Notable for the following components:      Result Value   Abs Immature Granulocytes 0.09 (*)    All other components within normal limits  BASIC METABOLIC PANEL - Abnormal; Notable for the following components:    Sodium 134 (*)    Glucose, Bld 106 (*)    All other components within normal limits  URINALYSIS, ROUTINE W REFLEX MICROSCOPIC - Abnormal; Notable for the following components:   Hgb urine dipstick SMALL (*)    All other components within normal limits    EKG None  Radiology CT Lumbar Spine Wo Contrast  Result Date: 10/25/2020 CLINICAL DATA:  Low back pain for over 6 weeks EXAM: CT LUMBAR SPINE WITHOUT CONTRAST TECHNIQUE: Multidetector CT imaging of the lumbar spine was performed without intravenous contrast administration. Multiplanar CT image reconstructions were also generated. COMPARISON:  Lumbar MRI 02/14/2018 FINDINGS: Segmentation: 5 lumbar type vertebrae based on the lowest ribs. Alignment: Degenerative straightening of the lumbar spine with L4-5 anterolisthesis and slight dextroscoliosis. Vertebrae: No evidence of fracture or bone lesion. No evidence of osteomyelitis. Paraspinal and other soft tissues: Atheromatous plaque. Multiple bilateral renal calculi, more numerous on the right. No hydronephrosis. Disc levels: T12- L1: Disc narrowing and mild endplate ridging L1-L2: Disc collapse with bulging and ridging. Moderate spinal stenosis L2-L3: Large left paracentral extrusion with partially calcified appearance. Parent disc is narrowed with circumferential bulging. Facet spurring asymmetric to the left. High-grade spinal stenosis with effacement of the left subarticular recess. Moderate left foraminal narrowing L3-L4: Disc narrowing and bulging with endplate spurring. Facet osteoarthritis on both sides. High-grade spinal stenosis L4-L5: Facet osteoarthritis with spurring and anterolisthesis. The disc is narrowed and bulging with high-grade spinal stenosis. Moderate right foraminal narrowing. L5-S1:Disc narrowing and bulging with downward pointing left paracentral herniation that is chronic and causes left S1 impingement. IMPRESSION: 1. Dominant change since a 2019 lumbar MRI is a sizable left  paracentral herniation at L2-3 with effacement of the left subarticular recess. 2. 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 3. Moderate to advanced spinal stenosis from L1-2 to L4-5. 4. Chronic moderate foraminal narrowing on the right at L4-5. Electronically Signed   By: Marnee SpringJonathon  Watts M.D.   On: 10/25/2020 06:29    Procedures Procedures   Medications Ordered in ED Medications  oxyCODONE-acetaminophen (PERCOCET/ROXICET) 5-325 MG per tablet 1 tablet (1 tablet Oral Given 10/25/20 [id number])    ED Course  I have reviewed the triage vital signs and the nursing notes.  Pertinent labs & imaging results that were available during my care of the patient were reviewed by me and considered in my medical decision making (see chart for details).  Clinical Course as of 10/26/20 1844  Tue Oct 25, 2020  1244 Spinal stenosis dx 2 years ago - suggested surgery at that time but unable to afford d/t surgery. PT helpful.   [WF]    Clinical Course User Index [WF] Gailen ShelterFondaw, Shakeena Kafer S, [place]   MDM Rules/Calculators/A&P                           Patient is well-appearing 80 year old male interpreter was used for the entirety of visit.  Labs unremarkable urinalysis unremarkable.  Physical exam unremarkable apart from some mild tenderness palpation of the left hip and gluteal muscles and some mild lower back tenderness to palpation.  Overall well-appearing.  Vital signs within normal with apart from mild hypertension.  I discussed this case with my attending physician who cosigned this note including patient's presenting symptoms, physical exam, and planned diagnostics and interventions. Attending physician stated agreement with plan or made changes to plan which were implemented.   Attending physician assessed patient at bedside.   Patient discharged home with gabapentin, prednisone, Voltaren gel.  We will follow-up with his orthopedist Dr. Otelia SergeantNitka.  Patient is ambulatory on discharge.   Family neurologic deficits.  CT scan of his low back does show that he has some changes of stenosis which he already had a more mild degree of prior.  He has been considered for surgery in the past.  Final Clinical Impression(s) / ED  Diagnoses Final diagnoses:  Spinal stenosis, unspecified spinal region  Left hip pain    Rx / DC Orders ED Discharge Orders          Ordered    gabapentin (NEURONTIN) 100 MG capsule  2 times daily        10/25/20 1322    predniSONE (DELTASONE) 10 MG tablet  Daily        10/25/20 1322    diclofenac Sodium (VOLTAREN) 1 % GEL  4 times daily        10/25/20 1322             Solon Augusta Mosses, [place] 10/26/20 1845    Gwyneth Sprout, MD 10/29/20 [phone]

## 2020-10-25 NOTE — Discharge Instructions (Addendum)
Please follow-up with your orthopedic doctor Dr. Otelia Sergeant.  Please apply the cream I prescribed to you to your back 3-4 times per day.  Take Tylenol every 6 hours [phone] mg.  Use the gabapentin after breakfast and after dinner.  This may cause some drowsiness.

## 2020-10-26 ENCOUNTER — Ambulatory Visit: Payer: Medicare Other | Admitting: Family Medicine

## 2020-10-27 ENCOUNTER — Ambulatory Visit: Payer: Medicare Other | Admitting: Surgery

## 2020-11-03 ENCOUNTER — Other Ambulatory Visit: Payer: Self-pay | Admitting: Emergency Medicine

## 2020-11-03 NOTE — Telephone Encounter (Signed)
1.Medication Requested: diclofenac Sodium (VOLTAREN) 1 % GEL 2. Pharmacy (Name, Street, [facility]): [id number] - [place], [place] - [address] 3. On Med List: Y   4. Last Visit with PCP: 08/02/2020  5. Next visit date with PCP: 02/28/2021   Agent: Please be advised that RX refills may take up to 3 business days. We ask that you follow-up with your pharmacy.

## 2020-11-04 NOTE — Telephone Encounter (Signed)
Patient has called again today to ask for an update about the medications. Made him aware that the request is still pending and that our requests may take up to 3 business days

## 2020-11-04 NOTE — Telephone Encounter (Signed)
Sent medication refill to Dr. Alvy Bimler for approval.

## 2020-11-04 NOTE — Telephone Encounter (Signed)
Please advise, pt was in the ER and was prescribed this medication.  Patient last seen in office 08/02/20.   Patient is requesting a refill of the following medications: Requested Prescriptions   Pending Prescriptions Disp Refills   diclofenac Sodium (VOLTAREN) 1 % GEL 100 g 0    Sig: Apply 4 g topically 4 (four) times daily.    Date of patient request: 11/03/20  Last office visit: 08/02/20  Date of last refill: 10/25/20  Last refill amount:  Follow up time period per chart: 02/28/21

## 2020-11-05 MED ORDER — DICLOFENAC SODIUM 1 % EX GEL: 4.0000 g | Freq: Four times a day (QID) | CUTANEOUS | 0 refills | Status: DC

## 2020-11-10 ENCOUNTER — Other Ambulatory Visit: Payer: Self-pay

## 2020-11-10 ENCOUNTER — Encounter: Payer: Self-pay | Admitting: Surgery

## 2020-11-10 ENCOUNTER — Ambulatory Visit (INDEPENDENT_AMBULATORY_CARE_PROVIDER_SITE_OTHER): Payer: Medicare Other | Admitting: Surgery

## 2020-11-10 VITALS — BP 140/80 | HR 95

## 2020-11-10 DIAGNOSIS — M5416 Radiculopathy, lumbar region: Secondary | ICD-10-CM

## 2020-11-10 NOTE — Progress Notes (Signed)
Office Visit Note   Patient: Levi Phillips           Date of Birth: June 18, 1940           MRN: [id number] Visit Date: 11/10/2020              Requested by: Georgina Quint, MD [address] [place],  [place] [address] PCP: Georgina Quint, MD   Assessment & Plan: Visit Diagnoses:  1. Radiculopathy, lumbar region     Plan: I reviewed the CT lumbar report from October 25, 2020.  I advised patient that the surgeon needs to review this study and discuss treatment options.  I was able to get patient an appointment to see Dr. Otelia Sergeant tomorrow and he can discuss treatment options then.  Follow-Up Instructions: Return in about 1 day (around 11/11/2020) for 9:45am with Dr Otelia Sergeant.   Orders:  No orders of the defined types were placed in this encounter.  No orders of the defined types were placed in this encounter.     Procedures: No procedures performed   Clinical Data: No additional findings.   Subjective: Chief Complaint  Patient presents with   Lower Back - Follow-up    HPI 80 year old male with history of chronic lumbar spine issues with multilevel degenerative disc disease and protrusion at L5-S1 and spondylolisthesis at L4-5.  Last seen in the office by Dr. Otelia Sergeant June 2020.  Again comes in with complaints of worsening low back pain and left lower extremity radiculopathy.  Patient states that his pain is 10 out of 10.  Patient presented to the emergency department October 25, 2020 with worsening pain after a fall.  ED ordered CT lumbar and that report showed:  Narrative & Impression  CLINICAL DATA:  Low back pain for over 6 weeks   EXAM: CT LUMBAR SPINE WITHOUT CONTRAST   TECHNIQUE: Multidetector CT imaging of the lumbar spine was performed without intravenous contrast administration. Multiplanar CT image reconstructions were also generated.   COMPARISON:  Lumbar MRI 02/14/2018   FINDINGS: Segmentation: 5 lumbar type vertebrae based on the lowest ribs.    Alignment: Degenerative straightening of the lumbar spine with L4-5 anterolisthesis and slight dextroscoliosis.   Vertebrae: No evidence of fracture or bone lesion. No evidence of osteomyelitis.   Paraspinal and other soft tissues: Atheromatous plaque. Multiple bilateral renal calculi, more numerous on the right. No hydronephrosis.   Disc levels:   T12- L1: Disc narrowing and mild endplate ridging   L1-L2: Disc collapse with bulging and ridging. Moderate spinal stenosis   L2-L3: Large left paracentral extrusion with partially calcified appearance. Parent disc is narrowed with circumferential bulging. Facet spurring asymmetric to the left. High-grade spinal stenosis with effacement of the left subarticular recess. Moderate left foraminal narrowing   L3-L4: Disc narrowing and bulging with endplate spurring. Facet osteoarthritis on both sides. High-grade spinal stenosis   L4-L5: Facet osteoarthritis with spurring and anterolisthesis. The disc is narrowed and bulging with high-grade spinal stenosis. Moderate right foraminal narrowing.   L5-S1:Disc narrowing and bulging with downward pointing left paracentral herniation that is chronic and causes left S1 impingement.   IMPRESSION: 1. Dominant change since a 2019 lumbar MRI is a sizable left paracentral herniation at L2-3 with effacement of the left subarticular recess. 2. 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 3. Moderate to advanced spinal stenosis from L1-2 to L4-5. 4. Chronic moderate foraminal narrowing on the right at L4-5.     Electronically Signed   By:  Marnee Spring M.D.   On: 10/25/2020 06:29          Objective: Vital Signs: BP 140/80 (BP Location: Left Arm, Patient Position: Sitting, Cuff Size: Normal)   Pulse 95   SpO2 96%   Physical Exam Gait is antalgic.  Positive left-sided notch tenderness.  Positive left straight leg raise. Ortho Exam  Specialty Comments:  No  specialty comments available.  Imaging: No results found.   PMFS History: Patient Active Problem List   Diagnosis Date Noted   Situational anxiety 08/02/2020   Spinal stenosis of lumbar region 10/15/2017   Chronic bilateral low back pain with left-sided sciatica 10/15/2017   History of vertigo 05/09/2017   History of kidney stones 04/10/2017   Renal colic on left side 04/10/2017   DDD (degenerative disc disease), lumbar 01/09/2017   Gout 01/09/2017   BPH (benign prostatic hyperplasia) 01/09/2017   BMI 27.0-27.9,adult 01/09/2017   Past Medical History:  Diagnosis Date   Allergy    Anemia    Arthritis    Back pain    spondylolisthesis   Enlarged prostate    takes Flomax daily   Gout    takes Allopurinol daily    Family History  Problem Relation Age of Onset   Cancer Mother    Alzheimer's disease Sister    Alzheimer's disease Sister     Past Surgical History:  Procedure Laterality Date   cataract surgery Bilateral    EYE SURGERY     LITHOTRIPSY     done in [place]   Social History   Occupational History   Not on file  Tobacco Use   Smoking status: Never   Smokeless tobacco: Never  Vaping Use   Vaping Use: Never used  Substance and Sexual Activity   Alcohol use: Yes    Comment: occ beer   Drug use: No   Sexual activity: Not on file

## 2020-11-11 ENCOUNTER — Ambulatory Visit (INDEPENDENT_AMBULATORY_CARE_PROVIDER_SITE_OTHER): Payer: Medicare Other | Admitting: Specialist

## 2020-11-11 ENCOUNTER — Encounter: Payer: Self-pay | Admitting: Specialist

## 2020-11-11 VITALS — BP 136/87 | HR 87 | Ht 70.0 in | Wt 182.0 lb

## 2020-11-11 DIAGNOSIS — M48062 Spinal stenosis, lumbar region with neurogenic claudication: Secondary | ICD-10-CM

## 2020-11-11 DIAGNOSIS — M4316 Spondylolisthesis, lumbar region: Secondary | ICD-10-CM

## 2020-11-11 DIAGNOSIS — M4807 Spinal stenosis, lumbosacral region: Secondary | ICD-10-CM | POA: Diagnosis not present

## 2020-11-11 DIAGNOSIS — M5416 Radiculopathy, lumbar region: Secondary | ICD-10-CM

## 2020-11-11 MED ORDER — POLYETHYLENE GLYCOL 3350 17 G PO PACK: 17.0000 g | PACK | Freq: Every day | ORAL | 0 refills | Status: DC

## 2020-11-11 MED ORDER — HYDROCODONE-ACETAMINOPHEN 5-325 MG PO TABS: 1.0000 | ORAL_TABLET | Freq: Four times a day (QID) | ORAL | 0 refills | Status: DC | PRN

## 2020-11-11 MED ORDER — KETOROLAC TROMETHAMINE 30 MG/ML IJ SOLN: 30.0000 mg | Freq: Once | INTRAMUSCULAR | Status: AC

## 2020-11-11 MED ORDER — METHYLPREDNISOLONE 4 MG PO TBPK: ORAL_TABLET | ORAL | 0 refills | Status: DC

## 2020-11-11 MED ORDER — GABAPENTIN 300 MG PO CAPS: ORAL_CAPSULE | ORAL | 1 refills | Status: DC

## 2020-11-11 NOTE — Patient Instructions (Signed)
Avoid bending, stooping and avoid lifting weights greater than 10 lbs. Avoid prolong standing and walking. Avoid frequent bending and stooping  No lifting greater than 10 lbs. May use ice or moist heat for pain. Weight loss is of benefit. Lumbar myelogram and post myelogram CT scan of the lumbar spine. Hydrocodone for pain. Gabapentin 300 mg po BID for 4 days then 300 mg po TID. Miralax for constipation, 17 gm or one heaping tablespoon ful mixed in 8 oz of water or juice each morning.

## 2020-11-11 NOTE — Progress Notes (Signed)
Office Visit Note   Patient: Levi Phillips           Date of Birth: Dec 24, 1940           MRN: [id number] Visit Date: 11/11/2020              Requested by: Georgina Quint, MD [address] [place],  [place] [address] PCP: Georgina Quint, MD   Assessment & Plan: Visit Diagnoses:  1. Radiculopathy, lumbar region   2. Spondylolisthesis, lumbar region   3. Spinal stenosis of lumbar region with neurogenic claudication   4. Spinal stenosis of lumbosacral region     Plan: Avoid bending, stooping and avoid lifting weights greater than 10 lbs. Avoid prolong standing and walking. Avoid frequent bending and stooping  No lifting greater than 10 lbs. May use ice or moist heat for pain. Weight loss is of benefit. Lumbar myelogram and post myelogram CT scan of the lumbar spine. Hydrocodone for pain. Gabapentin 300 mg po BID for 4 days then 300 mg po TID. Miralax for constipation, 17 gm or one heaping tablespoon ful mixed in 8 oz of water or juice each morning.  Follow-Up Instructions: Return in about 1 week (around 11/18/2020).   Orders:  Orders Placed This Encounter  Procedures   DG Myelogram Lumbar   CT LUMBAR SPINE W CONTRAST   Meds ordered this encounter  Medications   gabapentin (NEURONTIN) 300 MG capsule    Sig: Take 1 capsule (300 mg total) by mouth 2 (two) times daily for 4 days, THEN 1 capsule (300 mg total) 3 (three) times daily for 26 days.    Dispense:  90 capsule    Refill:  1   HYDROcodone-acetaminophen (NORCO/VICODIN) 5-325 MG tablet    Sig: Take 1 tablet by mouth every 6 (six) hours as needed for moderate pain.    Dispense:  30 tablet    Refill:  0   methylPREDNISolone (MEDROL DOSEPAK) 4 MG TBPK tablet    Sig: Take as directed 6 day dose pak    Dispense:  21 tablet    Refill:  0   polyethylene glycol (MIRALAX) 17 g packet    Sig: Take 17 g by mouth daily.    Dispense:  24 each    Refill:  0      Procedures: No procedures  performed   Clinical Data: No additional findings.   Subjective: Chief Complaint  Patient presents with   Lower Back - Pain    Per Levi Phillips needs to discuss surgery.    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 He is having significant worsening of pain left posterior thigh and posterior calf into the left  Heel. He was seen in the ER 2 weeks ago and had a non enhanced CT of the lumbar spine. He was Levi Phillips yesterday and requested to be seen today. The  Pain is severe left buttock and into the left posterior leg. No bowel or bladder diffculty. He is having difficulty standing and walking only 20 meters before the pain worsens. Difficulty sitting without leaning to the right side.    Review of Systems   Objective: Vital Signs: BP 136/87 (BP Location: Left Arm, Patient Position: Sitting)   Pulse 87   Ht 5\' 10"  (1.778 m)   Wt 182 lb (  82.6 kg)   BMI 26.11 kg/m   Physical Exam  Ortho Exam  Specialty Comments:  No specialty comments available.  Imaging: No results found.   PMFS History: Patient Active Problem List   Diagnosis Date Noted   Situational anxiety 08/02/2020   Spinal stenosis of lumbar region 10/15/2017   Chronic bilateral low back pain with left-sided sciatica 10/15/2017   History of vertigo 05/09/2017   History of kidney stones 04/10/2017   Renal colic on left side 04/10/2017   DDD (degenerative disc disease), lumbar 01/09/2017   Gout 01/09/2017   BPH (benign prostatic hyperplasia) 01/09/2017   BMI 27.0-27.9,adult 01/09/2017   Past Medical History:  Diagnosis Date   Allergy    Anemia    Arthritis    Back pain    spondylolisthesis   Enlarged prostate    takes Flomax daily   Gout    takes Allopurinol daily    Family History  Problem Relation Age of Onset   Cancer Mother     Alzheimer's disease Sister    Alzheimer's disease Sister     Past Surgical History:  Procedure Laterality Date   cataract surgery Bilateral    EYE SURGERY     LITHOTRIPSY     done in [place]   Social History   Occupational History   Not on file  Tobacco Use   Smoking status: Never   Smokeless tobacco: Never  Vaping Use   Vaping Use: Never used  Substance and Sexual Activity   Alcohol use: Yes    Comment: occ beer   Drug use: No   Sexual activity: Not on file

## 2020-11-18 ENCOUNTER — Other Ambulatory Visit: Payer: Self-pay

## 2020-11-18 ENCOUNTER — Ambulatory Visit (INDEPENDENT_AMBULATORY_CARE_PROVIDER_SITE_OTHER): Payer: Medicare Other | Admitting: Specialist

## 2020-11-18 ENCOUNTER — Encounter: Payer: Self-pay | Admitting: Specialist

## 2020-11-18 VITALS — BP 138/79 | HR 69 | Ht 70.0 in | Wt 182.0 lb

## 2020-11-18 DIAGNOSIS — M48062 Spinal stenosis, lumbar region with neurogenic claudication: Secondary | ICD-10-CM | POA: Diagnosis not present

## 2020-11-18 DIAGNOSIS — M5416 Radiculopathy, lumbar region: Secondary | ICD-10-CM | POA: Diagnosis not present

## 2020-11-18 DIAGNOSIS — M4807 Spinal stenosis, lumbosacral region: Secondary | ICD-10-CM | POA: Diagnosis not present

## 2020-11-18 DIAGNOSIS — M4316 Spondylolisthesis, lumbar region: Secondary | ICD-10-CM | POA: Diagnosis not present

## 2020-11-18 MED ORDER — XTAMPZA ER 9 MG PO C12A: 1.0000 | EXTENDED_RELEASE_CAPSULE | Freq: Two times a day (BID) | ORAL | 0 refills | Status: AC

## 2020-11-18 NOTE — Progress Notes (Addendum)
Office Visit Note   Patient: Levi Phillips           Date of Birth: [birth year]           MRN: [id number] Visit Date: 11/18/2020              Requested by: Georgina Quint, MD [address] [place],  [place] [address] PCP: Georgina Quint, MD   Assessment & Plan: Visit Diagnoses:  1. Radiculopathy, lumbar region   2. Spondylolisthesis, lumbar region   3. Spinal stenosis of lumbar region with neurogenic claudication   4. Spinal stenosis of lumbosacral region    Plan: Avoid bending, stooping and avoid lifting weights greater than 10 lbs. Avoid prolong standing and walking. Avoid frequent bending and stooping  No lifting greater than 10 lbs. May use ice or moist heat for pain. Weight loss is of benefit. Handicap license is approved. A myelogram and post myelogram CT scan of the lumbar spine is necessary to assess the spine and determine levels that are having nerve compression.  Surgery scheduling secretary Tivis Ringer, will call you in the next week if plan is to perform surgical decompression and fusion surgery.  Surgery recommended is a multiple level lumbar surgery with fusion L4-5 and decompression with possible discectomy L2-3 and L5-S1 this would be done with rods, screws and cages with local bone graft and allograft (donor bone graft). Take hydrocodone for for pain. Risk of surgery includes risk of infection 1 in 200 patients, bleeding 1/2% chance you would need a transfusion.   Risk to the nerves is one in 10,000. You will need to use a brace for 3 months and wean from the brace on the 4th month. Expect improved walking and standing tolerance. Expect relief of leg pain but numbness may persist depending on the length and degree of pressure that has been present. Start Oxycodone ER (xtampza ER) 9 mg every 12 hours to provide baseline pain control and take the  Hydrocodone if there is pain still one every 4-6 hours for any breakthrough pain.       Procedures: No procedures performed   Clinical Data: Findings:  Narrative & Impression CLINICAL DATA:  Low back pain for over 6 weeks  EXAM: CT LUMBAR SPINE WITHOUT CONTRAST  TECHNIQUE: Multidetector CT imaging of the lumbar spine was performed without intravenous contrast administration. Multiplanar CT image reconstructions were also generated.  COMPARISON:  Lumbar MRI 02/14/2018  FINDINGS: Segmentation: 5 lumbar type vertebrae based on the lowest ribs.  Alignment: Degenerative straightening of the lumbar spine with L4-5 anterolisthesis and slight dextroscoliosis.  Vertebrae: No evidence of fracture or bone lesion. No evidence of osteomyelitis.  Paraspinal and other soft tissues: Atheromatous plaque. Multiple bilateral renal calculi, more numerous on the right. No hydronephrosis.  Disc levels:  T12- L1: Disc narrowing and mild endplate ridging  Z3-G6: Disc collapse with bulging and ridging. Moderate spinal stenosis  L2-L3: Large left paracentral extrusion with partially calcified appearance. Parent disc is narrowed with circumferential bulging. Facet spurring asymmetric to the left. High-grade spinal stenosis with effacement of the left subarticular recess. Moderate left foraminal narrowing  L3-L4: Disc narrowing and bulging with endplate spurring. Facet osteoarthritis on both sides. High-grade spinal stenosis  L4-L5: Facet osteoarthritis with spurring and anterolisthesis. The disc is narrowed and bulging with high-grade spinal stenosis. Moderate right foraminal narrowing.  L5-S1:Disc narrowing and bulging with downward pointing left paracentral herniation that is chronic and causes left S1 impingement.  IMPRESSION: 1. Dominant change  since a 2019 lumbar MRI is a sizable left paracentral herniation at L2-3 with effacement of the left subarticular recess. 2. 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 3. Moderate to  advanced spinal stenosis from L1-2 to L4-5. 4. Chronic moderate foraminal narrowing on the right at L4-5.   Electronically Signed   By: Marnee Spring M.D.   On: 10/25/2020 06:29    Subjective: Chief Complaint  Patient presents with  . Lower Back - Follow-up    Meds helped for first 2 days he was able to sleep, but he was not able to sleep last night.    80 year old male seen one week ago with back pain that is severe with spondylolisthesis L4-5 and pain that is constant lumbosacral and radiates into the buttocks and legs left greater than right. He speaks [place] and requires an interpreter. He is unable to stand or walk. Had an unenhanced CT scan of the lumbar spine done in mid August with calcified disc herniation L2-3 and spondylolisthesis at L4-5 and disc herniation at  L5-S1. He is unable to stand and walk and has left leg posterior and lateral leg pain with radiation into the plantar aspect of the left foot and heel. He is constipated and hydrocodone helped but it is becoming less effective. A myelogram was ordered and apparently this is approved but unfortunately the Radiology facility has called and no one has been answering the phone. We will arrange the study while here in the office. Mehranguiz Fonnie Mu is the interpreter and she can be called to relay messages to Mr. Gomillion [phone].   Review of Systems  Constitutional: Negative.   HENT: Negative.    Eyes: Negative.   Respiratory: Negative.    Cardiovascular: Negative.   Gastrointestinal: Negative.   Endocrine: Negative.   Genitourinary: Negative.   Musculoskeletal: Negative.   Skin: Negative.   Allergic/Immunologic: Negative.   Neurological: Negative.   Hematological: Negative.   Psychiatric/Behavioral: Negative.      Objective: Vital Signs: BP 138/79 (BP Location: Left Arm, Patient Position: Sitting)   Pulse 69   Ht 5\' 10"  (1.778 m)   Wt 182 lb (82.6 kg)   BMI 26.11 kg/m   Physical  Exam Constitutional:      Appearance: He is well-developed.  HENT:     Head: Normocephalic and atraumatic.  Eyes:     Pupils: Pupils are equal, round, and reactive to light.  Pulmonary:     Effort: Pulmonary effort is normal.     Breath sounds: Normal breath sounds.  Abdominal:     General: Bowel sounds are normal.     Palpations: Abdomen is soft.  Musculoskeletal:     Cervical back: Normal range of motion and neck supple.     Lumbar back: Positive right straight leg raise test and positive left straight leg raise test.  Skin:    General: Skin is warm and dry.  Neurological:     Mental Status: He is alert and oriented to person, place, and time.  Psychiatric:        Behavior: Behavior normal.        Thought Content: Thought content normal.        Judgment: Judgment normal.   Back Exam   Tenderness  The patient is experiencing tenderness in the lumbar.  Range of Motion  Extension:  abnormal  Flexion:  abnormal  Lateral bend right:  abnormal  Lateral bend left:  abnormal  Rotation right:  abnormal  Rotation left:  abnormal   Muscle Strength  Right Quadriceps:  5/5  Left Quadriceps:  5/5  Right Hamstrings:  5/5  Left Hamstrings:  5/5   Tests  Straight leg raise right: positive Straight leg raise left: positive  Reflexes  Patellar:  0/4 Achilles:  0/4  Other  Toe walk: abnormal Heel walk: abnormal Sensation: decreased Gait: normal   Comments:  Lying on exam table right side down with knees flexed up.     Specialty Comments:  No specialty comments available.  Imaging: No results found.   PMFS History: Patient Active Problem List   Diagnosis Date Noted  . Situational anxiety 08/02/2020  . Spinal stenosis of lumbar region 10/15/2017  . Chronic bilateral low back pain with left-sided sciatica 10/15/2017  . History of vertigo 05/09/2017  . History of kidney stones 04/10/2017  . 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 DDD (degenerative disc disease),  lumbar 01/09/2017  . Gout 01/09/2017  . BPH (benign prostatic hyperplasia) 01/09/2017  . BMI 27.0-27.9,adult 01/09/2017   Past Medical History:  Diagnosis Date  . Allergy   . Anemia   . Arthritis   . Back pain    spondylolisthesis  . Enlarged prostate    takes Flomax daily  . Gout    takes Allopurinol daily    Family History  Problem Relation Age of Onset  . Cancer Mother   . Alzheimer's disease Sister   . Alzheimer's disease Sister     Past Surgical History:  Procedure Laterality Date  . cataract surgery Bilateral   . EYE SURGERY    . LITHOTRIPSY     done in [place]   Social History   Occupational History  . Not on file  Tobacco Use  . Smoking status: Never  . Smokeless tobacco: Never  Vaping Use  . Vaping Use: Never used  Substance and Sexual Activity  . Alcohol use: Yes    Comment: occ beer  . Drug use: No  . Sexual activity: Not on file

## 2020-11-18 NOTE — Patient Instructions (Addendum)
Plan: Avoid bending, stooping and avoid lifting weights greater than 10 lbs. Avoid prolong standing and walking. Avoid frequent bending and stooping  No lifting greater than 10 lbs. May use ice or moist heat for pain. Weight loss is of benefit. Handicap license is approved. A myelogram and post myelogram CT scan of the lumbar spine is necessary to assess the spine and determine levels that are having nerve compression.  Surgery scheduling secretary Tivis Ringer, will call you in the next week if plan is to perform surgical decompression and fusion surgery.  Surgery recommended is a multiple level lumbar surgery with fusion L4-5 and decompression with possible discectomy L2-3 and L5-S1 this would be done with rods, screws and cages with local bone graft and allograft (donor bone graft). Take hydrocodone for for pain. Risk of surgery includes risk of infection 1 in 200 patients, bleeding 1/2% chance you would need a transfusion.   Risk to the nerves is one in 10,000. You will need to use a brace for 3 months and wean from the brace on the 4th month. Expect improved walking and standing tolerance. Expect relief of leg pain but numbness may persist depending on the length and degree of pressure that has been present. Start Oxycodone ER (xtampza ER) 9 mg every 12 hours to provide baseline pain control and take the  Hydrocodone if there is pain still one every 4-6 hours for any breakthrough pain.   Follow-Up Instructions: No follow-ups on file.   Orders:  No orders of the defined types were placed in this encounter.  No orders of the defined types were placed in this encounter.

## 2020-11-21 ENCOUNTER — Telehealth: Payer: Self-pay | Admitting: Specialist

## 2020-11-21 NOTE — Telephone Encounter (Signed)
Pt would like to know what else he could take for constipation. He states the miralax is not  working.   CB [phone]

## 2020-11-21 NOTE — Telephone Encounter (Signed)
Duplicate message--Sherrie sent a message too.

## 2020-11-21 NOTE — Telephone Encounter (Signed)
I called and per Dr. Otelia Sergeant, try Fleets Enema that is OTC at the pharmacy

## 2020-11-24 ENCOUNTER — Other Ambulatory Visit: Payer: Self-pay

## 2020-11-24 ENCOUNTER — Ambulatory Visit
Admission: RE | Admit: 2020-11-24 | Discharge: 2020-11-24 | Disposition: A | Discharge status: Home / Self Care | Payer: Medicare Other | Source: Ambulatory Visit | Attending: Specialist | Admitting: Specialist

## 2020-11-24 DIAGNOSIS — M4807 Spinal stenosis, lumbosacral region: Secondary | ICD-10-CM

## 2020-11-24 DIAGNOSIS — M5416 Radiculopathy, lumbar region: Secondary | ICD-10-CM

## 2020-11-24 DIAGNOSIS — M48062 Spinal stenosis, lumbar region with neurogenic claudication: Secondary | ICD-10-CM

## 2020-11-24 DIAGNOSIS — M4316 Spondylolisthesis, lumbar region: Secondary | ICD-10-CM

## 2020-11-24 MED ORDER — DIAZEPAM 5 MG PO TABS
5.0000 mg | ORAL_TABLET | Freq: Once | ORAL | Status: AC
  Administered 2020-11-24: 5 mg via ORAL

## 2020-11-24 MED ORDER — MEPERIDINE HCL 50 MG/ML IJ SOLN
50.0000 mg | Freq: Once | INTRAMUSCULAR | Status: AC | PRN
  Administered 2020-11-24: 50 mg via INTRAMUSCULAR

## 2020-11-24 MED ORDER — IOPAMIDOL (ISOVUE-M 200) INJECTION 41%
15.0000 mL | Freq: Once | INTRAMUSCULAR | Status: AC
  Administered 2020-11-24: 15 mL via INTRATHECAL

## 2020-11-24 MED ORDER — ONDANSETRON HCL 4 MG/2ML IJ SOLN
4.0000 mg | Freq: Once | INTRAMUSCULAR | Status: AC | PRN
  Administered 2020-11-24: 4 mg via INTRAMUSCULAR

## 2020-11-24 NOTE — Discharge Instructions (Signed)

## 2020-11-24 NOTE — Discharge Instr - Other Orders (Addendum)
1020: 10/10 pain noted to lower back and left leg prior to myelogram procedure. Pt unable to sit down/placed on stretcher per pt request, MD Mosetta Putt notified, see MAR, pain medication given prior to procedure. Interpreter at bedside.

## 2020-11-25 ENCOUNTER — Ambulatory Visit (INDEPENDENT_AMBULATORY_CARE_PROVIDER_SITE_OTHER): Payer: Medicare Other | Admitting: Specialist

## 2020-11-25 ENCOUNTER — Encounter: Payer: Self-pay | Admitting: Specialist

## 2020-11-25 VITALS — BP 146/77 | HR 96 | Ht 70.0 in | Wt 182.0 lb

## 2020-11-25 DIAGNOSIS — M5416 Radiculopathy, lumbar region: Secondary | ICD-10-CM

## 2020-11-25 DIAGNOSIS — M4316 Spondylolisthesis, lumbar region: Secondary | ICD-10-CM | POA: Diagnosis not present

## 2020-11-25 DIAGNOSIS — M5126 Other intervertebral disc displacement, lumbar region: Secondary | ICD-10-CM | POA: Diagnosis not present

## 2020-11-25 DIAGNOSIS — M48062 Spinal stenosis, lumbar region with neurogenic claudication: Secondary | ICD-10-CM

## 2020-11-25 NOTE — Patient Instructions (Signed)
Plan: Avoid bending, stooping and avoid lifting weights greater than 10 lbs. Avoid prolong standing and walking. Avoid frequent bending and stooping  No lifting greater than 10 lbs. May use ice or moist heat for pain. Weight loss is of benefit. Handicap license is approved. Dr.  Blas secretary/Assistant or will call to arrange for epidural steroid injection

## 2020-11-25 NOTE — Progress Notes (Signed)
Office Visit Note   Patient: Levi Phillips           Date of Birth: Oct 26, 1940           MRN: [id number] Visit Date: 11/25/2020              Requested by: Georgina Quint, MD [address] [place],  [place] [address] PCP: Georgina Quint, MD   Assessment & Plan: Visit Diagnoses:  1. Radiculopathy, lumbar region   2. Spondylolisthesis, lumbar region   3. Spinal stenosis of lumbar region with neurogenic claudication   4. Lumbar disc herniation     Plan: Avoid bending, stooping and avoid lifting weights greater than 10 lbs. Avoid prolong standing and walking. Avoid frequent bending and stooping  No lifting greater than 10 lbs. May use ice or moist heat for pain. Weight loss is of benefit. Handicap license is approved. Dr. Matheny Blas secretary/Assistant or will call to arrange for epidural steroid injection    Follow-Up Instructions: No follow-ups on file.   Orders:  No orders of the defined types were placed in this encounter.  No orders of the defined types were placed in this encounter.     Procedures: No procedures performed   Clinical Data: Findings:  Narrative & Impression CLINICAL DATA:  Lumbar radiculopathy. Lumbar spondylolisthesis. Spinal stenosis of lumbar region with neurogenic claudication. Left S1 radiculopathy.   EXAM: LUMBAR MYELOGRAM   FLUOROSCOPY TIME:  Fluoroscopy Time: 1 minute 33 seconds   Radiation Exposure Index: 344.66 microGray*m^2   PROCEDURE: After thorough discussion of risks and benefits of the procedure including bleeding, infection, injury to nerves, blood vessels, adjacent structures as well as headache and CSF leak, written and oral informed consent was obtained. Consent was obtained by Dr. Sebastian Ache. Time out form was completed.   Patient was positioned prone on the fluoroscopy table. Local anesthesia was provided with 1% lidocaine without epinephrine after prepped and draped in the usual sterile  fashion. Puncture was performed at L5-S1 using a 3 1/2 inch 22-gauge spinal needle via a right interlaminar approach. Using a single pass through the dura, the needle was placed within the thecal sac, with return of clear CSF. 15 mL of Isovue M-200 was injected into the thecal sac, with normal opacification of the nerve roots and cauda equina consistent with free flow within the subarachnoid space.   I personally performed the lumbar puncture and administered the intrathecal contrast. I also personally supervised acquisition of the myelogram images.   TECHNIQUE: Contiguous axial images were obtained through the Lumbar spine after the intrathecal infusion of contrast. Coronal and sagittal reconstructions were obtained of the axial image sets.   COMPARISON:  Noncontrast lumbar spine CT 10/25/2020. Lumbar spine MRI 02/14/2018.   FINDINGS: LUMBAR MYELOGRAM FINDINGS:   There are 5 non rib-bearing lumbar type vertebrae. Grade 1 anterolisthesis of L4 on L5 does not significantly change with flexion or extension. A ventral extradural defect at this level results in at least moderate spinal stenosis and bilateral lateral recess stenosis. A ventral extradural defect at L3-4 results in milder spinal stenosis. There is a prominent left lateral extradural defect at L2-3 affecting the lateral recess and distorting multiple nerve roots. There is also evidence of asymmetric left lateral recess stenosis at L5-S1 with underfilling of the left S1 nerve root sleeve.   CT LUMBAR MYELOGRAM FINDINGS:   Vertebral alignment is unchanged from the recent CT with straightening of the normal lumbar lordosis, mild lumbar dextroscoliosis, and grade 1  anterolisthesis of L4 on L5 again noted. No acute fracture or suspicious osseous lesion is identified. Disc space narrowing is moderate at L1-2, L2-3, and L4-5 and mild at L3-4 and L5-S1. The conus medullaris terminates at L1. There is abdominal aortic  atherosclerosis without aneurysm. Bilateral renal calculi are noted with detailed assessment limited by motion and incomplete coverage.   T12-L1: Mild disc bulging and mild facet hypertrophy without stenosis, unchanged from the 2019 MRI.   L1-2: Disc bulging, a broad-based posterior disc osteophyte complex, and mild facet and ligamentum flavum hypertrophy result in mild spinal stenosis, mild right and mild-to-moderate left lateral recess stenosis, and mild bilateral neural foraminal stenosis, likely mildly progressed from the prior MRI.   L2-3: As noted on the recent CT, there is a large left paracentral and subarticular disc extrusion which is new from 2019 and results in severe left lateral recess stenosis with potential impingement of multiple left-sided nerve roots. Circumferential disc bulging eccentric to the left, endplate spurring, and moderate facet and ligamentum flavum hypertrophy contribute to moderate spinal stenosis, mild-to-moderate right lateral recess stenosis, and moderate left neural foraminal stenosis. Potential impingement of the left L2 nerve in the extraforaminal region.   L3-4: Circumferential disc bulging and severe right and moderate left facet hypertrophy result in moderate spinal stenosis, moderate bilateral lateral recess stenosis, and mild right and moderate left neural foraminal stenosis, similar to the prior MRI.   L4-5: Anterolisthesis with bulging uncovered disc and severe facet hypertrophy result in moderate to severe spinal and bilateral lateral recess stenosis and moderate to severe right and moderate left neural foraminal stenosis, similar to the prior MRI. Potential right L4 and bilateral L5 nerve root impingement.   L5-S1: A large, chronic left paracentral disc extrusion with caudal migration has enlarged since 2019 and results in severe left lateral recess stenosis with left S1 nerve root impingement. Disc bulging and mild facet  hypertrophy result in mild bilateral neural foraminal stenosis. No significant generalized spinal stenosis.   IMPRESSION: 1. 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 2. Large left paracentral and subarticular disc extrusion at L2-3 with severe left lateral recess stenosis. 3. Moderate to severe spinal, lateral recess, and neural foraminal stenosis at L4-5. 4. Moderate spinal stenosis and moderate left neural foraminal stenosis at L3-4. 5. Aortic Atherosclerosis (ICD10-I70.0).     Electronically Signed   By: Sebastian Ache M.D.   On: 11/24/2020 16:50    Subjective: Chief Complaint  Patient presents with   Lower Back - Follow-up, Pain    80 year old male with history of lumbar spinal stenosis over multiple lumbar segments with one month worsening of symptoms. Underwent an ESI with good  Improvement in pain and with therapy he has been able to put up with the discomfort for nearly 2 years. Now with increased pain left buttock and thigh and calf. He is reluctant to have surgery due to age. Speaks Farsi and some english. No bowel or bladder disfunction, he is mainly sitting and in bed with intermittant Walk.   Review of Systems  Constitutional: Negative.   HENT: Negative.    Eyes: Negative.   Respiratory: Negative.    Cardiovascular: Negative.   Gastrointestinal: Negative.   Endocrine: Negative.   Genitourinary: Negative.   Musculoskeletal: Negative.   Skin: Negative.   Allergic/Immunologic: Negative.   Neurological: Negative.   Hematological: Negative.   Psychiatric/Behavioral: Negative.      Objective: Vital Signs: BP Marland Kitchen)  146/77 (BP Location: Left Arm, Patient Position: Sitting)   Pulse 96   Ht  (1.778 m)   Wt 182 lb (82.6 kg)   BMI 26.11 kg/m   Physical Exam Constitutional:      Appearance: He is well-developed.  HENT:     Head: Normocephalic and atraumatic.  Eyes:     Pupils:  Pupils are equal, round, and reactive to light.  Pulmonary:     Effort: Pulmonary effort is normal.     Breath sounds: Normal breath sounds.  Abdominal:     General: Bowel sounds are normal.     Palpations: Abdomen is soft.  Musculoskeletal:     Cervical back: Normal range of motion and neck supple.     Lumbar back: Negative right straight leg raise test and negative left straight leg raise test.  Skin:    General: Skin is warm and dry.  Neurological:     Mental Status: He is alert and oriented to person, place, and time.  Psychiatric:        Behavior: Behavior normal.        Thought Content: Thought content normal.        Judgment: Judgment normal.    Back Exam   Tenderness  The patient is experiencing tenderness in the lumbar.  Range of Motion  Extension:  abnormal  Flexion:  normal  Lateral bend right:  abnormal  Lateral bend left:  abnormal  Rotation right:  abnormal  Rotation left:  abnormal   Muscle Strength  Right Quadriceps:  5/5  Left Quadriceps:  5/5  Right Hamstrings:  5/5  Left Hamstrings:  5/5   Tests  Straight leg raise right: negative Straight leg raise left: negative  Reflexes  Patellar:  1/4 Achilles:  1/4 Babinski's sign: normal   Other  Toe walk: normal Heel walk: normal Sensation: decreased  Comments:  Standing increases symptoms and with lying and sitting.     Specialty Comments:  No specialty comments available.  Imaging: CT LUMBAR SPINE W CONTRAST  Result Date: 11/24/2020 CLINICAL DATA:  Lumbar radiculopathy. Lumbar spondylolisthesis. Spinal stenosis of lumbar region with neurogenic claudication. Left S1 radiculopathy. EXAM: LUMBAR MYELOGRAM FLUOROSCOPY TIME:  Fluoroscopy Time: 1 minute 33 seconds Radiation Exposure Index: 344.66 microGray*m^2 PROCEDURE: After thorough discussion of risks and benefits of the procedure including bleeding, infection, injury to nerves, blood vessels, adjacent structures as well as headache and CSF  leak, written and oral informed consent was obtained. Consent was obtained by Dr. Sebastian Ache. Time out form was completed. Patient was positioned prone on the fluoroscopy table. Local anesthesia was provided with 1% lidocaine without epinephrine after prepped and draped in the usual sterile fashion. Puncture was performed at L5-S1 using a 3 1/2 inch 22-gauge spinal needle via a right interlaminar approach. Using a single pass through the dura, the needle was placed within the thecal sac, with return of clear CSF. 15 mL of Isovue M-200 was injected into the thecal sac, with normal opacification of the nerve roots and cauda equina consistent with free flow within the subarachnoid space. I personally performed the lumbar puncture and administered the intrathecal contrast. I also personally supervised acquisition of the myelogram images. TECHNIQUE: Contiguous axial images were obtained through the Lumbar spine after the intrathecal infusion of contrast. Coronal and sagittal reconstructions were obtained of the axial image sets. COMPARISON:  Noncontrast lumbar spine CT 10/25/2020. Lumbar spine MRI 02/14/2018. FINDINGS: LUMBAR MYELOGRAM FINDINGS: There are 5 non rib-bearing lumbar type vertebrae. Grade  1 anterolisthesis of L4 on L5 does not significantly change with flexion or extension. A ventral extradural defect at this level results in at least moderate spinal stenosis and bilateral lateral recess stenosis. A ventral extradural defect at L3-4 results in milder spinal stenosis. There is a prominent left lateral extradural defect at L2-3 affecting the lateral recess and distorting multiple nerve roots. There is also evidence of asymmetric left lateral recess stenosis at L5-S1 with underfilling of the left S1 nerve root sleeve. CT LUMBAR MYELOGRAM FINDINGS: Vertebral alignment is unchanged from the recent CT with straightening of the normal lumbar lordosis, mild lumbar dextroscoliosis, and grade 1 anterolisthesis of L4  on L5 again noted. No acute fracture or suspicious osseous lesion is identified. Disc space narrowing is moderate at L1-2, L2-3, and L4-5 and mild at L3-4 and L5-S1. The conus medullaris terminates at L1. There is abdominal aortic atherosclerosis without aneurysm. Bilateral renal calculi are noted with detailed assessment limited by motion and incomplete coverage. T12-L1: Mild disc bulging and mild facet hypertrophy without stenosis, unchanged from the 2019 MRI. L1-2: Disc bulging, a broad-based posterior disc osteophyte complex, and mild facet and ligamentum flavum hypertrophy result in mild spinal stenosis, mild right and mild-to-moderate left lateral recess stenosis, and mild bilateral neural foraminal stenosis, likely mildly progressed from the prior MRI. L2-3: As noted on the recent CT, there is a large left paracentral and subarticular disc extrusion which is new from 2019 and results in severe left lateral recess stenosis with potential impingement of multiple left-sided nerve roots. Circumferential disc bulging eccentric to the left, endplate spurring, and moderate facet and ligamentum flavum hypertrophy contribute to moderate spinal stenosis, mild-to-moderate right lateral recess stenosis, and moderate left neural foraminal stenosis. Potential impingement of the left L2 nerve in the extraforaminal region. L3-4: Circumferential disc bulging and severe right and moderate left facet hypertrophy result in moderate spinal stenosis, moderate bilateral lateral recess stenosis, and mild right and moderate left neural foraminal stenosis, similar to the prior MRI. L4-5: Anterolisthesis with bulging uncovered disc and severe facet hypertrophy result in moderate to severe spinal and bilateral lateral recess stenosis and moderate to severe right and moderate left neural foraminal stenosis, similar to the prior MRI. Potential right L4 and bilateral L5 nerve root impingement. L5-S1: A large, chronic left paracentral disc  extrusion with caudal migration has enlarged since 2019 and results in severe left lateral recess stenosis with left S1 nerve root impingement. Disc bulging and mild facet hypertrophy result in mild bilateral neural foraminal stenosis. No significant generalized spinal stenosis. IMPRESSION: 1. 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 2. Large left paracentral and subarticular disc extrusion at L2-3 with severe left lateral recess stenosis. 3. Moderate to severe spinal, lateral recess, and neural foraminal stenosis at L4-5. 4. Moderate spinal stenosis and moderate left neural foraminal stenosis at L3-4. 5. Aortic Atherosclerosis (ICD10-I70.0). Electronically Signed   By: Sebastian Ache M.D.   On: 11/24/2020 16:50   DG MYELOGRAPHY LUMBAR INJ LUMBOSACRAL  Result Date: 11/24/2020 CLINICAL DATA:  Lumbar radiculopathy. Lumbar spondylolisthesis. Spinal stenosis of lumbar region with neurogenic claudication. Left S1 radiculopathy. EXAM: LUMBAR MYELOGRAM FLUOROSCOPY TIME:  Fluoroscopy Time: 1 minute 33 seconds Radiation Exposure Index: 344.66 microGray*m^2 PROCEDURE: After thorough discussion of risks and benefits of the procedure including bleeding, infection, injury to nerves, blood vessels, adjacent structures as well as headache and CSF leak, written and oral informed consent was obtained. Consent was obtained by  Dr. Sebastian Ache. Time out form was completed. Patient was positioned prone on the fluoroscopy table. Local anesthesia was provided with 1% lidocaine without epinephrine after prepped and draped in the usual sterile fashion. Puncture was performed at L5-S1 using a 3 1/2 inch 22-gauge spinal needle via a right interlaminar approach. Using a single pass through the dura, the needle was placed within the thecal sac, with return of clear CSF. 15 mL of Isovue M-200 was injected into the thecal sac, with normal opacification of the nerve roots  and cauda equina consistent with free flow within the subarachnoid space. I personally performed the lumbar puncture and administered the intrathecal contrast. I also personally supervised acquisition of the myelogram images. TECHNIQUE: Contiguous axial images were obtained through the Lumbar spine after the intrathecal infusion of contrast. Coronal and sagittal reconstructions were obtained of the axial image sets. COMPARISON:  Noncontrast lumbar spine CT 10/25/2020. Lumbar spine MRI 02/14/2018. FINDINGS: LUMBAR MYELOGRAM FINDINGS: There are 5 non rib-bearing lumbar type vertebrae. Grade 1 anterolisthesis of L4 on L5 does not significantly change with flexion or extension. A ventral extradural defect at this level results in at least moderate spinal stenosis and bilateral lateral recess stenosis. A ventral extradural defect at L3-4 results in milder spinal stenosis. There is a prominent left lateral extradural defect at L2-3 affecting the lateral recess and distorting multiple nerve roots. There is also evidence of asymmetric left lateral recess stenosis at L5-S1 with underfilling of the left S1 nerve root sleeve. CT LUMBAR MYELOGRAM FINDINGS: Vertebral alignment is unchanged from the recent CT with straightening of the normal lumbar lordosis, mild lumbar dextroscoliosis, and grade 1 anterolisthesis of L4 on L5 again noted. No acute fracture or suspicious osseous lesion is identified. Disc space narrowing is moderate at L1-2, L2-3, and L4-5 and mild at L3-4 and L5-S1. The conus medullaris terminates at L1. There is abdominal aortic atherosclerosis without aneurysm. Bilateral renal calculi are noted with detailed assessment limited by motion and incomplete coverage. T12-L1: Mild disc bulging and mild facet hypertrophy without stenosis, unchanged from the 2019 MRI. L1-2: Disc bulging, a broad-based posterior disc osteophyte complex, and mild facet and ligamentum flavum hypertrophy result in mild spinal stenosis, mild  right and mild-to-moderate left lateral recess stenosis, and mild bilateral neural foraminal stenosis, likely mildly progressed from the prior MRI. L2-3: As noted on the recent CT, there is a large left paracentral and subarticular disc extrusion which is new from 2019 and results in severe left lateral recess stenosis with potential impingement of multiple left-sided nerve roots. Circumferential disc bulging eccentric to the left, endplate spurring, and moderate facet and ligamentum flavum hypertrophy contribute to moderate spinal stenosis, mild-to-moderate right lateral recess stenosis, and moderate left neural foraminal stenosis. Potential impingement of the left L2 nerve in the extraforaminal region. L3-4: Circumferential disc bulging and severe right and moderate left facet hypertrophy result in moderate spinal stenosis, moderate bilateral lateral recess stenosis, and mild right and moderate left neural foraminal stenosis, similar to the prior MRI. L4-5: Anterolisthesis with bulging uncovered disc and severe facet hypertrophy result in moderate to severe spinal and bilateral lateral recess stenosis and moderate to severe right and moderate left neural foraminal stenosis, similar to the prior MRI. Potential right L4 and bilateral L5 nerve root impingement. L5-S1: A large, chronic left paracentral disc extrusion with caudal migration has enlarged since 2019 and results in severe left lateral recess stenosis with left S1 nerve root impingement. Disc bulging and mild facet hypertrophy result in mild  bilateral neural foraminal stenosis. No significant generalized spinal stenosis. IMPRESSION: 1. 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 2. Large left paracentral and subarticular disc extrusion at L2-3 with severe left lateral recess stenosis. 3. Moderate to severe spinal, lateral recess, and neural foraminal stenosis at L4-5. 4. Moderate spinal  stenosis and moderate left neural foraminal stenosis at L3-4. 5. Aortic Atherosclerosis (ICD10-I70.0). Electronically Signed   By: Sebastian Ache M.D.   On: 11/24/2020 16:50     PMFS History: Patient Active Problem List   Diagnosis Date Noted   Situational anxiety 08/02/2020   Spinal stenosis of lumbar region 10/15/2017   Chronic bilateral low back pain with left-sided sciatica 10/15/2017   History of vertigo 05/09/2017   History of kidney stones 04/10/2017   Renal colic on left side 04/10/2017   DDD (degenerative disc disease), lumbar 01/09/2017   Gout 01/09/2017   BPH (benign prostatic hyperplasia) 01/09/2017   BMI 27.0-27.9,adult 01/09/2017   Past Medical History:  Diagnosis Date   Allergy    Anemia    Arthritis    Back pain    spondylolisthesis   Enlarged prostate    takes Flomax daily   Gout    takes Allopurinol daily    Family History  Problem Relation Age of Onset   Cancer Mother    Alzheimer's disease Sister    Alzheimer's disease Sister     Past Surgical History:  Procedure Laterality Date   cataract surgery Bilateral    EYE SURGERY     LITHOTRIPSY     done in [place]   Social History   Occupational History   Not on file  Tobacco Use   Smoking status: Never   Smokeless tobacco: Never  Vaping Use   Vaping Use: Never used  Substance and Sexual Activity   Alcohol use: Yes    Comment: occ beer   Drug use: No   Sexual activity: Not on file

## 2020-11-28 ENCOUNTER — Telehealth: Payer: Self-pay | Admitting: Physical Medicine and Rehabilitation

## 2020-11-28 NOTE — Telephone Encounter (Signed)
This is in referrals.

## 2020-11-28 NOTE — Telephone Encounter (Signed)
Ordered changed to [facility], Levi Phillips will advise the patient that they will call him

## 2020-11-28 NOTE — Telephone Encounter (Signed)
Pt called back to check on getting an earlier appt; pt states he can't sleep, sit, or walk. So pt is wanting to get an injection referral sent out today if possible. Pt would like a CB ASAP.   [phone]

## 2020-11-28 NOTE — Telephone Encounter (Signed)
Called patient to schedule. We do not have an opening this week, but I offered to work him in next Tuesday. He states that he cannot wait that long. Not sure if you want to try Levi Phillips.

## 2020-11-28 NOTE — Telephone Encounter (Signed)
Pt calling to get sch with Dr. Alvester Morin when possible. The best call back number is [phone].

## 2020-11-29 ENCOUNTER — Telehealth: Payer: Self-pay | Admitting: Specialist

## 2020-11-29 ENCOUNTER — Other Ambulatory Visit: Payer: Self-pay | Admitting: Specialist

## 2020-11-29 NOTE — Telephone Encounter (Signed)
I called and advised patient that the request has been sent to Dr. Otelia Sergeant. I advised patient that he would need to call his pharmacy and check the status of it.

## 2020-11-29 NOTE — Telephone Encounter (Signed)
Patient called. Says he is in a lot of pain. Would like Christy to call him. [phone]

## 2020-11-29 NOTE — Telephone Encounter (Signed)
Pt called stating he is in severe pains and need strong pain medication. Patient is asking for pain medication to be sent to [facility]. Pt states he cant sleep, walk, sit. Pt has upcoming appt 9/27 with Dr. Alvester Morin but pt states he need strong medication for pain. Pt is asking for a call back at [phone].

## 2020-11-30 MED ORDER — HYDROCODONE-ACETAMINOPHEN 5-325 MG PO TABS: 1.0000 | ORAL_TABLET | Freq: Four times a day (QID) | ORAL | 0 refills | Status: DC | PRN

## 2020-12-01 ENCOUNTER — Telehealth: Payer: Self-pay | Admitting: Physical Medicine and Rehabilitation

## 2020-12-01 NOTE — Telephone Encounter (Signed)
Patient called advised he would like to keep his appointment with Dr. Alvester Morin on 12/06/2020. Patient said he do not want to cancel this appointment.  [phone]

## 2020-12-06 ENCOUNTER — Ambulatory Visit: Payer: Self-pay

## 2020-12-06 ENCOUNTER — Other Ambulatory Visit: Payer: Self-pay

## 2020-12-06 ENCOUNTER — Ambulatory Visit (INDEPENDENT_AMBULATORY_CARE_PROVIDER_SITE_OTHER): Payer: Medicare Other | Admitting: Physical Medicine and Rehabilitation

## 2020-12-06 VITALS — BP 122/77 | HR 88

## 2020-12-06 DIAGNOSIS — M5416 Radiculopathy, lumbar region: Secondary | ICD-10-CM

## 2020-12-06 DIAGNOSIS — M48062 Spinal stenosis, lumbar region with neurogenic claudication: Secondary | ICD-10-CM

## 2020-12-06 DIAGNOSIS — M5116 Intervertebral disc disorders with radiculopathy, lumbar region: Secondary | ICD-10-CM

## 2020-12-06 NOTE — Progress Notes (Signed)
Pt state lower back pain that travels to his butt and down his left leg. Pt state walking, standing and sitting makes the pain worse. Pt state laying down and takes pain meds to help ease his pain.  Numeric Pain Rating Scale and Functional Assessment Average Pain 10   In the last MONTH (on 0-10 scale) has pain interfered with the following?  1. General activity like being  able to carry out your everyday physical activities such as walking, climbing stairs, carrying groceries, or moving a chair?  Rating(10)   +Driver, -BT, -Dye Allergies.

## 2020-12-06 NOTE — Progress Notes (Signed)
Levi Phillips - 80 y.o. male MRN [id number]  Date of birth: 02-25-1941  Office Visit Note: Visit Date: 12/06/2020 PCP: Georgina Quint, MD Referred by: Georgina Quint, *  Subjective: Chief Complaint  Patient presents with   Lower Back - Pain   Left Leg - Pain   HPI:  Levi Phillips is a 80 y.o. male who comes in today at the request of Dr. Vira Browns for planned Left L5-S1 Lumbar Interlaminar epidural steroid injection with fluoroscopic guidance.  The patient has failed conservative care including home exercise, medications, time and activity modification.  This injection will be diagnostic and hopefully therapeutic.  Please see requesting physician notes for further details and justification.  CT myelogram reviewed with significant lumbar stenosis at L4-5 with paracentral disc extrusion at L5-S1 contributing to significant S1 nerve root impingement.  He also has significant disc herniation at L2-3 as well.  His symptoms do seem to fit with the lower lumbar region in terms of dermatomal pattern.  Interpreter present today.  Depending on results could look at transforaminal approach at L5 or S1.   ROS Otherwise per HPI.  Assessment & Plan: Visit Diagnoses:    ICD-10-CM   1. Lumbar radiculopathy  M54.16 XR C-ARM NO REPORT    Epidural Steroid injection    2. Radiculopathy due to lumbar intervertebral disc disorder  M51.16     3. Spinal stenosis of lumbar region with neurogenic claudication  M48.062       Plan: No additional findings.   Meds & Orders: No orders of the defined types were placed in this encounter.   Orders Placed This Encounter  Procedures   XR C-ARM NO REPORT   Epidural Steroid injection    Follow-up: Return for Vira Browns, MD as scheduled.   Procedures: No procedures performed  Lumbar Epidural Steroid Injection - Interlaminar Approach with Fluoroscopic Guidance  Patient: Levi Phillips      Date of Birth: Apr 28, 1940 MRN: [id number] PCP:  Georgina Quint, MD      Visit Date: 12/06/2020   Universal Protocol:     Consent Given By: the patient  Position: PRONE  Additional Comments: Vital signs were monitored before and after the procedure. Patient was prepped and draped in the usual sterile fashion. The correct patient, procedure, and site was verified.   Injection Procedure Details:   Procedure diagnoses: Lumbar radiculopathy [M54.16]   Meds Administered: No orders of the defined types were placed in this encounter.    Laterality: Left  Location/Site:  L5-S1  Needle: 3.5 in., 20 ga. Tuohy  Needle Placement: Paramedian epidural  Findings:   -Comments: Excellent flow of contrast into the epidural space.  With initial entry to the ligamentum flavum the patient did have significant pain response for a brief moment.  No paresthesia.  He did have significant pain response with instillation of the medication which is typical.  Procedure Details: Using a paramedian approach from the side mentioned above, the region overlying the inferior lamina was localized under fluoroscopic visualization and the soft tissues overlying this structure were infiltrated with 4 ml. of 1% Lidocaine without Epinephrine. The Tuohy needle was inserted into the epidural space using a paramedian approach.   The epidural space was localized using loss of resistance along with counter oblique bi-planar fluoroscopic views.  After negative aspirate for air, blood, and CSF, a 2 ml. volume of Isovue-250 was injected into the epidural space and the flow of contrast was observed. Radiographs were obtained for documentation  purposes.    The injectate was administered into the level noted above.   Additional Comments:  The patient tolerated the procedure well Dressing: 2 x 2 sterile gauze and Band-Aid    Post-procedure details: Patient was observed during the procedure. Post-procedure instructions were reviewed.  Patient left the clinic in  stable condition.    Clinical History: CT Myelogram: IMPRESSION: 1. 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 2. Large left paracentral and subarticular disc extrusion at L2-3 with severe left lateral recess stenosis. 3. Moderate to severe spinal, lateral recess, and neural foraminal stenosis at L4-5. 4. Moderate spinal stenosis and moderate left neural foraminal stenosis at L3-4. 5. Aortic Atherosclerosis (ICD10-I70.0).     Electronically Signed   By: Sebastian Ache M.D.   On: 11/24/2020 16:50     Objective:  VS:  HT:    WT:   BMI:     BP:122/77  HR:88bpm  TEMP: ( )  RESP:  Physical Exam Vitals and nursing note reviewed.  Constitutional:      General: He is not in acute distress.    Appearance: Normal appearance. He is not ill-appearing.  HENT:     Head: Normocephalic and atraumatic.     Right Ear: External ear normal.     Left Ear: External ear normal.     Nose: No congestion.  Eyes:     Extraocular Movements: Extraocular movements intact.  Cardiovascular:     Rate and Rhythm: Normal rate.     Pulses: Normal pulses.  Pulmonary:     Effort: Pulmonary effort is normal. No respiratory distress.  Abdominal:     General: There is no distension.     Palpations: Abdomen is soft.  Musculoskeletal:        General: No tenderness or signs of injury.     Cervical back: Neck supple.     Right lower leg: No edema.     Left lower leg: No edema.     Comments: Patient has good distal strength without clonus.  Patient prefers lying flat on the table and has difficulty sitting for any length of time.  He does have a positive slump test on the left.  Skin:    Findings: No erythema or rash.  Neurological:     General: No focal deficit present.     Mental Status: He is alert and oriented to person, place, and time.     Sensory: No sensory deficit.     Motor: No weakness or abnormal muscle tone.      Coordination: Coordination normal.  Psychiatric:        Mood and Affect: Mood normal.        Behavior: Behavior normal.     Imaging: No results found.

## 2020-12-06 NOTE — Procedures (Signed)
Lumbar Epidural Steroid Injection - Interlaminar Approach with Fluoroscopic Guidance  Patient: Levi Phillips      Date of Birth: 1941/01/03 MRN: [id number] PCP: Georgina Quint, MD      Visit Date: 12/06/2020   Universal Protocol:     Consent Given By: the patient  Position: PRONE  Additional Comments: Vital signs were monitored before and after the procedure. Patient was prepped and draped in the usual sterile fashion. The correct patient, procedure, and site was verified.   Injection Procedure Details:   Procedure diagnoses: Lumbar radiculopathy [M54.16]   Meds Administered: No orders of the defined types were placed in this encounter.    Laterality: Left  Location/Site:  L5-S1  Needle: 3.5 in., 20 ga. Tuohy  Needle Placement: Paramedian epidural  Findings:   -Comments: Excellent flow of contrast into the epidural space.  With initial entry to the ligamentum flavum the patient did have significant pain response for a brief moment.  No paresthesia.  He did have significant pain response with instillation of the medication which is typical.  Procedure Details: Using a paramedian approach from the side mentioned above, the region overlying the inferior lamina was localized under fluoroscopic visualization and the soft tissues overlying this structure were infiltrated with 4 ml. of 1% Lidocaine without Epinephrine. The Tuohy needle was inserted into the epidural space using a paramedian approach.   The epidural space was localized using loss of resistance along with counter oblique bi-planar fluoroscopic views.  After negative aspirate for air, blood, and CSF, a 2 ml. volume of Isovue-250 was injected into the epidural space and the flow of contrast was observed. Radiographs were obtained for documentation purposes.    The injectate was administered into the level noted above.   Additional Comments:  The patient tolerated the procedure well Dressing: 2 x 2 sterile  gauze and Band-Aid    Post-procedure details: Patient was observed during the procedure. Post-procedure instructions were reviewed.  Patient left the clinic in stable condition.

## 2020-12-06 NOTE — Patient Instructions (Signed)

## 2020-12-07 ENCOUNTER — Telehealth: Payer: Self-pay

## 2020-12-07 MED ORDER — DIAZEPAM 5 MG PO TABS: 5.0000 mg | ORAL_TABLET | Freq: Two times a day (BID) | ORAL | 0 refills | Status: DC | PRN

## 2020-12-07 NOTE — Telephone Encounter (Signed)
Pt called and would like to be sent in valium

## 2020-12-07 NOTE — Telephone Encounter (Signed)
Please advise 

## 2020-12-07 NOTE — Telephone Encounter (Signed)
Called patient to advise. He expressed understanding.

## 2020-12-14 ENCOUNTER — Ambulatory Visit: Payer: Medicare Other | Admitting: Specialist

## 2020-12-15 ENCOUNTER — Telehealth: Payer: Self-pay | Admitting: Emergency Medicine

## 2020-12-15 DIAGNOSIS — Z87438 Personal history of other diseases of male genital organs: Secondary | ICD-10-CM

## 2020-12-15 MED ORDER — TAMSULOSIN HCL 0.4 MG PO CAPS
0.4000 mg | ORAL_CAPSULE | Freq: Every day | ORAL | 1 refills | Status: DC
Start: 2020-12-15 — End: 2021-03-15

## 2020-12-15 NOTE — Telephone Encounter (Signed)
1.Medication Requested: tamsulosin (FLOMAX) 0.4 MG CAPS capsule 2. Pharmacy (Name, Street, [place]): [facility] [address] [place] - [address] Phone:  [phone]  Fax:  [phone]     3. On Med List: Y  4. Last Visit with PCP: 08/02/2020  5. Next visit date with PCP: 02/28/2021   Agent: Please be advised that RX refills may take up to 3 business days. We ask that you follow-up with your pharmacy.

## 2020-12-15 NOTE — Telephone Encounter (Signed)
Refilled medication

## 2020-12-20 ENCOUNTER — Telehealth: Payer: Self-pay | Admitting: Emergency Medicine

## 2020-12-20 DIAGNOSIS — M109 Gout, unspecified: Secondary | ICD-10-CM

## 2020-12-20 NOTE — Telephone Encounter (Signed)
1.Medication Requested: allopurinol (ZYLOPRIM) 100 MG tablet  2. Pharmacy (Name, Street, [place]): [facility] [address], [place] - [address]  Phone:  [phone] Fax:  [phone]   3. On Med List: yes  4. Last Visit with PCP: 06.13.22  5. Next visit date with PCP: 12.20.22   Agent: Please be advised that RX refills may take up to 3 business days. We ask that you follow-up with your pharmacy.

## 2020-12-21 MED ORDER — ALLOPURINOL 100 MG PO TABS: 100.0000 mg | ORAL_TABLET | Freq: Every day | ORAL | 1 refills | Status: DC

## 2020-12-21 NOTE — Telephone Encounter (Signed)
Refilled allopurinol 100 mg to [facility].

## 2020-12-22 ENCOUNTER — Ambulatory Visit: Payer: Medicare Other | Admitting: Specialist

## 2020-12-29 ENCOUNTER — Encounter: Payer: Self-pay | Admitting: Specialist

## 2020-12-29 ENCOUNTER — Other Ambulatory Visit: Payer: Self-pay

## 2020-12-29 ENCOUNTER — Ambulatory Visit (INDEPENDENT_AMBULATORY_CARE_PROVIDER_SITE_OTHER): Payer: Medicare Other | Admitting: Specialist

## 2020-12-29 VITALS — BP 135/81 | HR 80 | Ht 74.8 in | Wt 178.0 lb

## 2020-12-29 DIAGNOSIS — M4807 Spinal stenosis, lumbosacral region: Secondary | ICD-10-CM | POA: Diagnosis not present

## 2020-12-29 DIAGNOSIS — M4316 Spondylolisthesis, lumbar region: Secondary | ICD-10-CM

## 2020-12-29 DIAGNOSIS — M48062 Spinal stenosis, lumbar region with neurogenic claudication: Secondary | ICD-10-CM

## 2020-12-29 DIAGNOSIS — M5126 Other intervertebral disc displacement, lumbar region: Secondary | ICD-10-CM | POA: Diagnosis not present

## 2020-12-29 NOTE — Patient Instructions (Signed)
Avoid bending, stooping and avoid lifting weights greater than 10 lbs. Avoid prolong standing and walking. Avoid frequent bending and stooping  No lifting greater than 10 lbs. May use ice or moist heat for pain. Weight loss is of benefit. Handicap license is approved. Does not desire further injection treatments due to the discomfort and if pain recurrs then surgery with decompression and localized fusion would be considered.

## 2020-12-29 NOTE — Progress Notes (Signed)
Office Visit Note   Patient: Levi Phillips           Date of Birth: 08/08/1940           MRN: [id number] Visit Date: 12/29/2020              Requested by: Georgina Quint, MD [address] [place],  [place] [address] PCP: Georgina Quint, MD   Assessment & Plan: Visit Diagnoses:  1. Spondylolisthesis, lumbar region   2. Lumbar disc herniation   3. Spinal stenosis of lumbosacral region   4. Spinal stenosis of lumbar region with neurogenic claudication     Plan: Avoid bending, stooping and avoid lifting weights greater than 10 lbs. Avoid prolong standing and walking. Avoid frequent bending and stooping  No lifting greater than 10 lbs. May use ice or moist heat for pain. Weight loss is of benefit. Handicap license is approved. Does not desire further injection treatments due to the discomfort and if pain recurrs then surgery with decompression and localized fusion would be considered.   Follow-Up Instructions: Return in about 3 weeks (around 01/19/2021).   Orders:  No orders of the defined types were placed in this encounter.  No orders of the defined types were placed in this encounter.     Procedures: No procedures performed   Clinical Data: Findings:  Narrative & Impression CLINICAL DATA:  Lumbar radiculopathy. Lumbar spondylolisthesis. Spinal stenosis of lumbar region with neurogenic claudication. Left S1 radiculopathy.   EXAM: LUMBAR MYELOGRAM   FLUOROSCOPY TIME:  Fluoroscopy Time: 1 minute 33 seconds   Radiation Exposure Index: 344.66 microGray*m^2   PROCEDURE: After thorough discussion of risks and benefits of the procedure including bleeding, infection, injury to nerves, blood vessels, adjacent structures as well as headache and CSF leak, written and oral informed consent was obtained. Consent was obtained by Dr. Sebastian Ache. Time out form was completed.   Patient was positioned prone on the fluoroscopy table. Local anesthesia  was provided with 1% lidocaine without epinephrine after prepped and draped in the usual sterile fashion. Puncture was performed at L5-S1 using a 3 1/2 inch 22-gauge spinal needle via a right interlaminar approach. Using a single pass through the dura, the needle was placed within the thecal sac, with return of clear CSF. 15 mL of Isovue M-200 was injected into the thecal sac, with normal opacification of the nerve roots and cauda equina consistent with free flow within the subarachnoid space.   I personally performed the lumbar puncture and administered the intrathecal contrast. I also personally supervised acquisition of the myelogram images.   TECHNIQUE: Contiguous axial images were obtained through the Lumbar spine after the intrathecal infusion of contrast. Coronal and sagittal reconstructions were obtained of the axial image sets.   COMPARISON:  Noncontrast lumbar spine CT 10/25/2020. Lumbar spine MRI 02/14/2018.   FINDINGS: LUMBAR MYELOGRAM FINDINGS:   There are 5 non rib-bearing lumbar type vertebrae. Grade 1 anterolisthesis of L4 on L5 does not significantly change with flexion or extension. A ventral extradural defect at this level results in at least moderate spinal stenosis and bilateral lateral recess stenosis. A ventral extradural defect at L3-4 results in milder spinal stenosis. There is a prominent left lateral extradural defect at L2-3 affecting the lateral recess and distorting multiple nerve roots. There is also evidence of asymmetric left lateral recess stenosis at L5-S1 with underfilling of the left S1 nerve root sleeve.   CT LUMBAR MYELOGRAM FINDINGS:   Vertebral alignment is unchanged from  the recent CT with straightening of the normal lumbar lordosis, mild lumbar dextroscoliosis, and grade 1 anterolisthesis of L4 on L5 again noted. No acute fracture or suspicious osseous lesion is identified. Disc space narrowing is moderate at L1-2, L2-3, and L4-5 and  mild at L3-4 and L5-S1. The conus medullaris terminates at L1. There is abdominal aortic atherosclerosis without aneurysm. Bilateral renal calculi are noted with detailed assessment limited by motion and incomplete coverage.   T12-L1: Mild disc bulging and mild facet hypertrophy without stenosis, unchanged from the 2019 MRI.   L1-2: Disc bulging, a broad-based posterior disc osteophyte complex, and mild facet and ligamentum flavum hypertrophy result in mild spinal stenosis, mild right and mild-to-moderate left lateral recess stenosis, and mild bilateral neural foraminal stenosis, likely mildly progressed from the prior MRI.   L2-3: As noted on the recent CT, there is a large left paracentral and subarticular disc extrusion which is new from 2019 and results in severe left lateral recess stenosis with potential impingement of multiple left-sided nerve roots. Circumferential disc bulging eccentric to the left, endplate spurring, and moderate facet and ligamentum flavum hypertrophy contribute to moderate spinal stenosis, mild-to-moderate right lateral recess stenosis, and moderate left neural foraminal stenosis. Potential impingement of the left L2 nerve in the extraforaminal region.   L3-4: Circumferential disc bulging and severe right and moderate left facet hypertrophy result in moderate spinal stenosis, moderate bilateral lateral recess stenosis, and mild right and moderate left neural foraminal stenosis, similar to the prior MRI.   L4-5: Anterolisthesis with bulging uncovered disc and severe facet hypertrophy result in moderate to severe spinal and bilateral lateral recess stenosis and moderate to severe right and moderate left neural foraminal stenosis, similar to the prior MRI. Potential right L4 and bilateral L5 nerve root impingement.   L5-S1: A large, chronic left paracentral disc extrusion with caudal migration has enlarged since 2019 and results in severe left  lateral recess stenosis with left S1 nerve root impingement. Disc bulging and mild facet hypertrophy result in mild bilateral neural foraminal stenosis. No significant generalized spinal stenosis.   IMPRESSION: 1. 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 2. Large left paracentral and subarticular disc extrusion at L2-3 with severe left lateral recess stenosis. 3. Moderate to severe spinal, lateral recess, and neural foraminal stenosis at L4-5. 4. Moderate spinal stenosis and moderate left neural foraminal stenosis at L3-4. 5. Aortic Atherosclerosis (ICD10-I70.0).     Electronically Signed   By: Sebastian Ache M.D.   On: 11/24/2020 16:50  Narrative & Impression CLINICAL DATA:  Lumbar radiculopathy. Lumbar spondylolisthesis. Spinal stenosis of lumbar region with neurogenic claudication. Left S1 radiculopathy.   EXAM: LUMBAR MYELOGRAM   FLUOROSCOPY TIME:  Fluoroscopy Time: 1 minute 33 seconds   Radiation Exposure Index: 344.66 microGray*m^2   PROCEDURE: After thorough discussion of risks and benefits of the procedure including bleeding, infection, injury to nerves, blood vessels, adjacent structures as well as headache and CSF leak, written and oral informed consent was obtained. Consent was obtained by Dr. Sebastian Ache. Time out form was completed.   Patient was positioned prone on the fluoroscopy table. Local anesthesia was provided with 1% lidocaine without epinephrine after prepped and draped in the usual sterile fashion. Puncture was performed at L5-S1 using a 3 1/2 inch 22-gauge spinal needle via a right interlaminar approach. Using a single pass through the dura, the needle was placed within the thecal sac, with return of clear CSF.  15 mL of Isovue M-200 was injected into the thecal sac, with normal opacification of the nerve roots and cauda equina consistent with free flow within the  subarachnoid space.   I personally performed the lumbar puncture and administered the intrathecal contrast. I also personally supervised acquisition of the myelogram images.   TECHNIQUE: Contiguous axial images were obtained through the Lumbar spine after the intrathecal infusion of contrast. Coronal and sagittal reconstructions were obtained of the axial image sets.   COMPARISON:  Noncontrast lumbar spine CT 10/25/2020. Lumbar spine MRI 02/14/2018.   FINDINGS: LUMBAR MYELOGRAM FINDINGS:   There are 5 non rib-bearing lumbar type vertebrae. Grade 1 anterolisthesis of L4 on L5 does not significantly change with flexion or extension. A ventral extradural defect at this level results in at least moderate spinal stenosis and bilateral lateral recess stenosis. A ventral extradural defect at L3-4 results in milder spinal stenosis. There is a prominent left lateral extradural defect at L2-3 affecting the lateral recess and distorting multiple nerve roots. There is also evidence of asymmetric left lateral recess stenosis at L5-S1 with underfilling of the left S1 nerve root sleeve.   CT LUMBAR MYELOGRAM FINDINGS:   Vertebral alignment is unchanged from the recent CT with straightening of the normal lumbar lordosis, mild lumbar dextroscoliosis, and grade 1 anterolisthesis of L4 on L5 again noted. No acute fracture or suspicious osseous lesion is identified. Disc space narrowing is moderate at L1-2, L2-3, and L4-5 and mild at L3-4 and L5-S1. The conus medullaris terminates at L1. There is abdominal aortic atherosclerosis without aneurysm. Bilateral renal calculi are noted with detailed assessment limited by motion and incomplete coverage.   T12-L1: Mild disc bulging and mild facet hypertrophy without stenosis, unchanged from the 2019 MRI.   L1-2: Disc bulging, a broad-based posterior disc osteophyte complex, and mild facet and ligamentum flavum hypertrophy result in mild spinal  stenosis, mild right and mild-to-moderate left lateral recess stenosis, and mild bilateral neural foraminal stenosis, likely mildly progressed from the prior MRI.   L2-3: As noted on the recent CT, there is a large left paracentral and subarticular disc extrusion which is new from 2019 and results in severe left lateral recess stenosis with potential impingement of multiple left-sided nerve roots. Circumferential disc bulging eccentric to the left, endplate spurring, and moderate facet and ligamentum flavum hypertrophy contribute to moderate spinal stenosis, mild-to-moderate right lateral recess stenosis, and moderate left neural foraminal stenosis. Potential impingement of the left L2 nerve in the extraforaminal region.   L3-4: Circumferential disc bulging and severe right and moderate left facet hypertrophy result in moderate spinal stenosis, moderate bilateral lateral recess stenosis, and mild right and moderate left neural foraminal stenosis, similar to the prior MRI.   L4-5: Anterolisthesis with bulging uncovered disc and severe facet hypertrophy result in moderate to severe spinal and bilateral lateral recess stenosis and moderate to severe right and moderate left neural foraminal stenosis, similar to the prior MRI. Potential right L4 and bilateral L5 nerve root impingement.   L5-S1: A large, chronic left paracentral disc extrusion with caudal migration has enlarged since 2019 and results in severe left lateral recess stenosis with left S1 nerve root impingement. Disc bulging and mild facet hypertrophy result in mild bilateral neural foraminal stenosis. No significant generalized spinal stenosis.   IMPRESSION: 1. 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 2. Large left paracentral and subarticular disc extrusion at L2-3 with severe left lateral recess stenosis.  3. Moderate to severe spinal, lateral  recess, and neural foraminal stenosis at L4-5. 4. Moderate spinal stenosis and moderate left neural foraminal stenosis at L3-4. 5. Aortic Atherosclerosis (ICD10-I70.0).     Electronically Signed   By: Sebastian Ache M.D.   On: 11/24/2020 16:50       Subjective: No chief complaint on file.   80 year old male with history of severe lumbar spinal stenosis and previous  ESIs have helped. He has had recent increased in pain with difficulty with standing and walking. He has had about 50% relief of pain into the back. He has been exercising and walking some. The shot was uncomfortable and the shot originally was painful and after a few days the pain improved. He is exercising and he feels that helps him as much as the injection. No bowel or bladder discomfort. He report intermittant left leg pain and paresthesias. He reports it took almost 10-11 days to work and he has stopped the pain medications. When he goes to walking the pain is greater at first then improves after a few moments. He postponed his citizenship interview due to pain. He has exercises that he is doing. He is better today.   Review of Systems  Constitutional: Negative.   HENT: Negative.    Eyes: Negative.   Respiratory: Negative.    Cardiovascular: Negative.   Gastrointestinal: Negative.   Endocrine: Negative.   Genitourinary: Negative.   Musculoskeletal: Negative.   Skin: Negative.   Allergic/Immunologic: Negative.   Neurological: Negative.   Hematological: Negative.   Psychiatric/Behavioral: Negative.      Objective: Vital Signs: BP 135/81   Pulse 80   Physical Exam Constitutional:      Appearance: He is well-developed.  HENT:     Head: Normocephalic and atraumatic.  Eyes:     Pupils: Pupils are equal, round, and reactive to light.  Pulmonary:     Effort: Pulmonary effort is normal.     Breath sounds: Normal breath sounds.  Abdominal:     General: Bowel sounds are normal.     Palpations: Abdomen is  soft.  Musculoskeletal:     Cervical back: Normal range of motion and neck supple.     Lumbar back: Negative right straight leg raise test and negative left straight leg raise test.  Skin:    General: Skin is warm and dry.  Neurological:     Mental Status: He is alert and oriented to person, place, and time.  Psychiatric:        Behavior: Behavior normal.        Thought Content: Thought content normal.        Judgment: Judgment normal.   Back Exam   Tenderness  The patient is experiencing tenderness in the lumbar.  Range of Motion  Extension:  abnormal  Lateral bend right:  abnormal  Lateral bend left:  abnormal  Rotation right:  abnormal  Rotation left:  abnormal   Muscle Strength  Right Quadriceps:  5/5  Left Quadriceps:  5/5  Left Hamstrings:  5/5   Tests  Straight leg raise right: negative Straight leg raise left: negative  Reflexes  Patellar:  2/4 Achilles:  2/4  Other  Toe walk: normal Heel walk: normal  Comments:  Motor without focal deficit. SLR negative. Hip ROM is normal.     Specialty Comments:  No specialty comments available.  Imaging: No results found.   PMFS History: Patient Active Problem List   Diagnosis Date Noted  Situational anxiety 08/02/2020   Spinal stenosis of lumbar region 10/15/2017   Chronic bilateral low back pain with left-sided sciatica 10/15/2017   History of vertigo 05/09/2017   History of kidney stones 04/10/2017   Renal colic on left side 04/10/2017   DDD (degenerative disc disease), lumbar 01/09/2017   Gout 01/09/2017   BPH (benign prostatic hyperplasia) 01/09/2017   BMI 27.0-27.9,adult 01/09/2017   Past Medical History:  Diagnosis Date   Allergy    Anemia    Arthritis    Back pain    spondylolisthesis   Enlarged prostate    takes Flomax daily   Gout    takes Allopurinol daily    Family History  Problem Relation Age of Onset   Cancer Mother    Alzheimer's disease Sister    Alzheimer's disease Sister      Past Surgical History:  Procedure Laterality Date   cataract surgery Bilateral    EYE SURGERY     LITHOTRIPSY     done in [place]   Social History   Occupational History   Not on file  Tobacco Use   Smoking status: Never   Smokeless tobacco: Never  Vaping Use   Vaping Use: Never used  Substance and Sexual Activity   Alcohol use: Yes    Comment: occ beer   Drug use: No   Sexual activity: Not on file

## 2021-01-19 ENCOUNTER — Ambulatory Visit: Payer: Medicare Other | Admitting: Specialist

## 2021-02-20 ENCOUNTER — Ambulatory Visit: Payer: Medicare Other | Admitting: Specialist

## 2021-02-28 ENCOUNTER — Encounter: Payer: Self-pay | Admitting: Emergency Medicine

## 2021-02-28 ENCOUNTER — Other Ambulatory Visit: Payer: Self-pay

## 2021-02-28 ENCOUNTER — Ambulatory Visit (INDEPENDENT_AMBULATORY_CARE_PROVIDER_SITE_OTHER): Payer: Medicare Other | Admitting: Emergency Medicine

## 2021-02-28 VITALS — BP 122/68 | HR 86 | Temp 98.0°F | Ht 74.0 in | Wt 176.0 lb

## 2021-02-28 DIAGNOSIS — Z130 Encounter for screening for diseases of the blood and blood-forming organs and certain disorders involving the immune mechanism: Secondary | ICD-10-CM

## 2021-02-28 DIAGNOSIS — Z0000 Encounter for general adult medical examination without abnormal findings: Secondary | ICD-10-CM | POA: Diagnosis not present

## 2021-02-28 DIAGNOSIS — Z13220 Encounter for screening for lipoid disorders: Secondary | ICD-10-CM | POA: Diagnosis not present

## 2021-02-28 DIAGNOSIS — Z87438 Personal history of other diseases of male genital organs: Secondary | ICD-10-CM

## 2021-02-28 DIAGNOSIS — Z1329 Encounter for screening for other suspected endocrine disorder: Secondary | ICD-10-CM

## 2021-02-28 DIAGNOSIS — Z13228 Encounter for screening for other metabolic disorders: Secondary | ICD-10-CM

## 2021-02-28 LAB — COMPREHENSIVE METABOLIC PANEL
ALT: 15 U/L (ref 0–53)
AST: 16 U/L (ref 0–37)
Albumin: 4.4 g/dL (ref 3.5–5.2)
Alkaline Phosphatase: 60 U/L (ref 39–117)
BUN: 17 mg/dL (ref 6–23)
CO2: 30 mEq/L (ref 19–32)
Calcium: 9.6 mg/dL (ref 8.4–10.5)
Chloride: 102 mEq/L (ref 96–112)
Creatinine, Ser: 1 mg/dL (ref 0.40–1.50)
GFR: 70.81 mL/min (ref >60.00)
Glucose, Bld: 92 mg/dL (ref 70–99)
Potassium: 4.7 mEq/L (ref 3.5–5.1)
Sodium: 139 mEq/L (ref 135–145)
Total Bilirubin: 0.8 mg/dL (ref 0.2–1.2)
Total Protein: 7.5 g/dL (ref 6.0–8.3)

## 2021-02-28 LAB — CBC WITH DIFFERENTIAL/PLATELET
Basophils Absolute: 0 10*3/uL (ref 0.0–0.1)
Basophils Relative: 0.3 % (ref 0.0–3.0)
Eosinophils Absolute: 0.5 10*3/uL (ref 0.0–0.7)
Eosinophils Relative: 6.5 % — ABNORMAL HIGH (ref 0.0–5.0)
HCT: 44.8 % (ref 39.0–52.0)
Hemoglobin: 15 g/dL (ref 13.0–17.0)
Lymphocytes Relative: 31 % (ref 12.0–46.0)
Lymphs Abs: 2.3 10*3/uL (ref 0.7–4.0)
MCHC: 33.4 g/dL (ref 30.0–36.0)
MCV: 88.6 fl (ref 78.0–100.0)
Monocytes Absolute: 0.7 10*3/uL (ref 0.1–1.0)
Monocytes Relative: 8.8 % (ref 3.0–12.0)
Neutro Abs: 4 10*3/uL (ref 1.4–7.7)
Neutrophils Relative %: 53.4 % (ref 43.0–77.0)
Platelets: 208 10*3/uL (ref 150.0–400.0)
RBC: 5.06 Mil/uL (ref 4.22–5.81)
RDW: 14 % (ref 11.5–15.5)
WBC: 7.4 10*3/uL (ref 4.0–10.5)

## 2021-02-28 LAB — LIPID PANEL
Cholesterol: 187 mg/dL (ref 0–200)
HDL: 44.3 mg/dL (ref >39.00)
LDL Cholesterol: 118 mg/dL — ABNORMAL HIGH (ref 0–99)
NonHDL: 142.51
Total CHOL/HDL Ratio: 4
Triglycerides: 124 mg/dL (ref 0.0–149.0)
VLDL: 24.8 mg/dL (ref 0.0–40.0)

## 2021-02-28 LAB — PSA: PSA: 1.96 ng/mL (ref 0.10–4.00)

## 2021-02-28 LAB — HEMOGLOBIN A1C: Hgb A1c MFr Bld: 5.4 % (ref 4.6–6.5)

## 2021-02-28 NOTE — Patient Instructions (Signed)

## 2021-02-28 NOTE — Progress Notes (Addendum)
Levi Phillips 80 y.o.   Chief Complaint  Patient presents with   Annual Exam    HISTORY OF PRESENT ILLNESS: This is a 80 y.o. male here for annual exam. Decreased appetite.  No other complaint or medical concerns. Overall doing well. Here with interpreter. Does not want to get any vaccines.  HPI   Prior to Admission medications   Medication Sig Start Date End Date Taking? Authorizing Provider  allopurinol (ZYLOPRIM) 100 MG tablet Take 1 tablet (100 mg total) by mouth daily. 12/21/20  Yes Jayleana Colberg, Eilleen Kempf, MD  ALPRAZolam Prudy Feeler) 0.25 MG tablet Take 1 tablet (0.25 mg total) by mouth 2 (two) times daily as needed for anxiety. 08/02/20  Yes Kamila Broda, Eilleen Kempf, MD  diazepam (VALIUM) 5 MG tablet Take 1 tablet (5 mg total) by mouth every 12 (twelve) hours as needed for anxiety. 12/07/20  Yes Tyrell Antonio, MD  diclofenac Sodium (VOLTAREN) 1 % GEL Apply 4 g topically 4 (four) times daily. 11/05/20  Yes Dianely Krehbiel, Eilleen Kempf, MD  HYDROcodone-acetaminophen (NORCO/VICODIN) 5-325 MG tablet Take 1 tablet by mouth every 6 (six) hours as needed for moderate pain. 11/30/20  Yes Kerrin Champagne, MD  methylPREDNISolone (MEDROL DOSEPAK) 4 MG TBPK tablet Take as directed 6 day dose pak 11/11/20  Yes Kerrin Champagne, MD  pantoprazole (PROTONIX) 40 MG tablet Take 1 tablet (40 mg total) by mouth daily. 02/26/20  Yes Peyton Najjar, MD  polyethylene glycol (MIRALAX) 17 g packet Take 17 g by mouth daily. 11/11/20  Yes Kerrin Champagne, MD  tamsulosin (FLOMAX) 0.4 MG CAPS capsule Take 1 capsule (0.4 mg total) by mouth daily. 12/15/20  Yes Yarissa Reining, Eilleen Kempf, MD  gabapentin (NEURONTIN) 100 MG capsule Take 1 capsule (100 mg total) by mouth 2 (two) times daily for 7 days. Patient not taking: Reported on 02/28/2021 10/25/20 11/01/20  Gailen Shelter, PA  gabapentin (NEURONTIN) 300 MG capsule Take 1 capsule (300 mg total) by mouth 2 (two) times daily for 4 days, THEN 1 capsule (300 mg total) 3 (three) times  daily for 26 days. Patient not taking: Reported on 02/28/2021 11/11/20 12/11/20  Kerrin Champagne, MD    No Known Allergies  Patient Active Problem List   Diagnosis Date Noted   Situational anxiety 08/02/2020   Spinal stenosis of lumbar region 10/15/2017   Chronic bilateral low back pain with left-sided sciatica 10/15/2017   History of vertigo 05/09/2017   History of kidney stones 04/10/2017   Renal colic on left side 04/10/2017   DDD (degenerative disc disease), lumbar 01/09/2017   Gout 01/09/2017   BPH (benign prostatic hyperplasia) 01/09/2017   BMI 27.0-27.9,adult 01/09/2017    Past Medical History:  Diagnosis Date   Allergy    Anemia    Arthritis    Back pain    spondylolisthesis   Enlarged prostate    takes Flomax daily   Gout    takes Allopurinol daily    Past Surgical History:  Procedure Laterality Date   cataract surgery Bilateral    EYE SURGERY     LITHOTRIPSY     done in [place]    Social History   Socioeconomic History   Marital status: Married    Spouse name: Not on file   Number of children: Not on file   Years of education: Not on file   Highest education level: Not on file  Occupational History   Not on file  Tobacco Use   Smoking status: Never  Smokeless tobacco: Never  Vaping Use   Vaping Use: Never used  Substance and Sexual Activity   Alcohol use: Yes    Comment: occ beer   Drug use: No   Sexual activity: Not on file  Other Topics Concern   Not on file  Social History Narrative   ** Merged History Encounter **       Social Determinants of Health   Financial Resource Strain: Not on file  Food Insecurity: Not on file  Transportation Needs: Not on file  Physical Activity: Not on file  Stress: Not on file  Social Connections: Not on file  Intimate Partner Violence: Not on file    Family History  Problem Relation Age of Onset   Cancer Mother    Alzheimer's disease Sister    Alzheimer's disease Sister      Review of  Systems  Constitutional: Negative.  Negative for chills and fever.  HENT: Negative.  Negative for congestion and sore throat.   Respiratory: Negative.  Negative for cough and shortness of breath.   Cardiovascular: Negative.  Negative for chest pain and palpitations.  Gastrointestinal:  Negative for abdominal pain, nausea and vomiting.  Genitourinary: Negative.  Negative for dysuria.  Skin: Negative.  Negative for rash.  All other systems reviewed and are negative.   Physical Exam Vitals reviewed.  Constitutional:      Appearance: Normal appearance.  HENT:     Head: Normocephalic and atraumatic.     Right Ear: Tympanic membrane, ear canal and external ear normal.     Left Ear: Tympanic membrane, ear canal and external ear normal.     Mouth/Throat:     Mouth: Mucous membranes are moist.     Pharynx: Oropharynx is clear.  Eyes:     Extraocular Movements: Extraocular movements intact.     Conjunctiva/sclera: Conjunctivae normal.     Pupils: Pupils are equal, round, and reactive to light.  Cardiovascular:     Rate and Rhythm: Normal rate and regular rhythm.     Pulses: Normal pulses.     Heart sounds: Murmur (Systolic 3/6) heard.  Pulmonary:     Effort: Pulmonary effort is normal.     Breath sounds: Normal breath sounds.  Abdominal:     General: Bowel sounds are normal. There is no distension.     Palpations: Abdomen is soft. There is no mass.     Tenderness: There is no abdominal tenderness.  Musculoskeletal:     Cervical back: Normal range of motion and neck supple. No tenderness.     Right lower leg: No edema.     Left lower leg: No edema.  Lymphadenopathy:     Cervical: No cervical adenopathy.  Skin:    General: Skin is warm and dry.     Capillary Refill: Capillary refill takes less than 2 seconds.  Neurological:     General: No focal deficit present.     Mental Status: He is alert and oriented to person, place, and time.  Psychiatric:        Mood and Affect: Mood  normal.        Behavior: Behavior normal.     ASSESSMENT & PLAN: Problem List Items Addressed This Visit   None Visit Diagnoses     Routine general medical examination at a health care facility    -  Primary   History of BPH       Relevant Orders   PSA(Must document that pt has been informed of  limitations of PSA testing.)   Screening for deficiency anemia       Relevant Orders   CBC with Differential   Screening for lipoid disorders       Relevant Orders   Lipid panel   Screening for endocrine, metabolic and immunity disorder       Relevant Orders   Comprehensive metabolic panel   Hemoglobin A1c      Modifiable risk factors discussed with patient. Anticipatory guidance according to age provided. The following topics were also discussed: Social Determinants of Health Smoking.  Non-smoker Diet and nutrition Benefits of exercise Cancer screening recommendations Vaccinations recommendations.  Not interested in any vaccines Cardiovascular risk assessment Mental health including depression and anxiety Fall and accident prevention  Patient Instructions  Health Maintenance, Male Adopting a healthy lifestyle and getting preventive care are important in promoting health and wellness. Ask your health care provider about: The right schedule for you to have regular tests and exams. Things you can do on your own to prevent diseases and keep yourself healthy. What should I know about diet, weight, and exercise? Eat a healthy diet  Eat a diet that includes plenty of vegetables, fruits, low-fat dairy products, and lean protein. Do not eat a lot of foods that are high in solid fats, added sugars, or sodium. Maintain a healthy weight Body mass index (BMI) is a measurement that can be used to identify possible weight problems. It estimates body fat based on height and weight. Your health care provider can help determine your BMI and help you achieve or maintain a healthy weight. Get  regular exercise Get regular exercise. This is one of the most important things you can do for your health. Most adults should: Exercise for at least 150 minutes each week. The exercise should increase your heart rate and make you sweat (moderate-intensity exercise). Do strengthening exercises at least twice a week. This is in addition to the moderate-intensity exercise. Spend less time sitting. Even light physical activity can be beneficial. Watch cholesterol and blood lipids Have your blood tested for lipids and cholesterol at 81 years of age, then have this test every 5 years. You may need to have your cholesterol levels checked more often if: Your lipid or cholesterol levels are high. You are older than 81 years of age. You are at high risk for heart disease. What should I know about cancer screening? Many types of cancers can be detected early and may often be prevented. Depending on your health history and family history, you may need to have cancer screening at various ages. This may include screening for: Colorectal cancer. Prostate cancer. Skin cancer. Lung cancer. What should I know about heart disease, diabetes, and high blood pressure? Blood pressure and heart disease High blood pressure causes heart disease and increases the risk of stroke. This is more likely to develop in people who have high blood pressure readings or are overweight. Talk with your health care provider about your target blood pressure readings. Have your blood pressure checked: Every 3-5 years if you are 70-37 years of age. Every year if you are 78 years old or older. If you are between the ages of 52 and 36 and are a current or former smoker, ask your health care provider if you should have a one-time screening for abdominal aortic aneurysm (AAA). Diabetes Have regular diabetes screenings. This checks your fasting blood sugar level. Have the screening done: Once every three years after age 23 if you are at  a normal weight and have a low risk for diabetes. More often and at a younger age if you are overweight or have a high risk for diabetes. What should I know about preventing infection? Hepatitis B If you have a higher risk for hepatitis B, you should be screened for this virus. Talk with your health care provider to find out if you are at risk for hepatitis B infection. Hepatitis C Blood testing is recommended for: Everyone born from [birth year] through 1966. Anyone with known risk factors for hepatitis C. Sexually transmitted infections (STIs) You should be screened each year for STIs, including gonorrhea and chlamydia, if: You are sexually active and are younger than 81 years of age. You are older than 81 years of age and your health care provider tells you that you are at risk for this type of infection. Your sexual activity has changed since you were last screened, and you are at increased risk for chlamydia or gonorrhea. Ask your health care provider if you are at risk. Ask your health care provider about whether you are at high risk for HIV. Your health care provider may recommend a prescription medicine to help prevent HIV infection. If you choose to take medicine to prevent HIV, you should first get tested for HIV. You should then be tested every 3 months for as long as you are taking the medicine. Follow these instructions at home: Alcohol use Do not drink alcohol if your health care provider tells you not to drink. If you drink alcohol: Limit how much you have to 0-2 drinks a day. Know how much alcohol is in your drink. In the [place] one drink equals one 12 oz bottle of beer (355 mL), one 5 oz glass of wine (148 mL), or one 1 oz glass of hard liquor (44 mL). Lifestyle Do not use any products that contain nicotine or tobacco. These products include cigarettes, chewing tobacco, and vaping devices, such as e-cigarettes. If you need help quitting, ask your health care provider. Do not use  street drugs. Do not share needles. Ask your health care provider for help if you need support or information about quitting drugs. General instructions Schedule regular health, dental, and eye exams. Stay current with your vaccines. Tell your health care provider if: You often feel depressed. You have ever been abused or do not feel safe at home. Summary Adopting a healthy lifestyle and getting preventive care are important in promoting health and wellness. Follow your health care provider's instructions about healthy diet, exercising, and getting tested or screened for diseases. Follow your health care provider's instructions on monitoring your cholesterol and blood pressure. This information is not intended to replace advice given to you by your health care provider. Make sure you discuss any questions you have with your health care provider. Document Revised: 07/18/2020 Document Reviewed: 07/18/2020 Elsevier Patient Education  2022 Elsevier Inc.     Edwina Barth, MD [facility]

## 2021-03-09 ENCOUNTER — Ambulatory Visit: Payer: Medicare Other | Admitting: Specialist

## 2021-03-14 ENCOUNTER — Telehealth: Payer: Self-pay | Admitting: Emergency Medicine

## 2021-03-14 DIAGNOSIS — Z87438 Personal history of other diseases of male genital organs: Secondary | ICD-10-CM

## 2021-03-14 NOTE — Telephone Encounter (Signed)
1.Medication Requested: tamsulosin (FLOMAX) 0.4 MG CAPS capsule 2. Pharmacy (Name, Street, [place]): [facility] [address] [place] - [address] Phone:  [phone]  Fax:  [phone]     3. On Med List: y  4. Last Visit with PCP:  5. Next visit date with PCP:   Requesting 6 month supply of medication

## 2021-03-15 MED ORDER — TAMSULOSIN HCL 0.4 MG PO CAPS: 0.4000 mg | ORAL_CAPSULE | Freq: Every day | ORAL | 1 refills | Status: DC

## 2021-03-15 NOTE — Telephone Encounter (Signed)
Refilled medication

## 2021-03-23 ENCOUNTER — Telehealth: Payer: Self-pay | Admitting: Emergency Medicine

## 2021-03-23 NOTE — Telephone Encounter (Signed)
Patient called and would like to have one send his lab results from 02/28/2021.  He is extremely upset and would like someone to give him a call back.

## 2021-03-24 NOTE — Telephone Encounter (Signed)
Thank you. He should check his MyChart messages.

## 2021-03-24 NOTE — Telephone Encounter (Signed)
Called and spoke with pt, will mail copy of lab result to patient.

## 2021-03-28 NOTE — Telephone Encounter (Signed)
Patient called very upset stating he has not received a hard copy of his lab results nor a call back  Advised patient a copy of his lab results was mailed on 03-01-2021 and the provider's assistant returned his call on 03-24-2021  Patient denied speaking w/ assistant, verified patient's address, patient stated it was the correct address  Will mail another copy of lab result to patient today 03-28-2021

## 2021-05-26 ENCOUNTER — Telehealth: Payer: Self-pay | Admitting: Emergency Medicine

## 2021-05-26 NOTE — Telephone Encounter (Signed)
1.Medication Requested: tamsulosin (FLOMAX) 0.4 MG CAPS capsule ? ?2. Pharmacy (Name, Street, [place]): [facility] [address], [place] - [address] ? ?3. On Med List: Y ? ?4. Last Visit with PCP: 02-28-2021 ? ?5. Next visit date with PCP: n/a ? ? ?Agent: Please be advised that RX refills may take up to 3 business days. We ask that you follow-up with your pharmacy.  ?

## 2021-05-29 NOTE — Telephone Encounter (Signed)
Called patient to inform him that his medication was sent to his pharmacy back in Jan with 90 tabs and 1 refill. Made patient aware that he should have additional refill at the pharmacy.  ?

## 2021-06-10 ENCOUNTER — Other Ambulatory Visit: Payer: Self-pay | Admitting: Emergency Medicine

## 2021-06-10 DIAGNOSIS — M109 Gout, unspecified: Secondary | ICD-10-CM

## 2021-06-26 ENCOUNTER — Emergency Department (HOSPITAL_BASED_OUTPATIENT_CLINIC_OR_DEPARTMENT_OTHER): Payer: Medicare Other

## 2021-06-26 ENCOUNTER — Emergency Department (HOSPITAL_BASED_OUTPATIENT_CLINIC_OR_DEPARTMENT_OTHER)
Admission: EM | Admit: 2021-06-26 | Discharge: 2021-06-26 | Disposition: A | Discharge status: Home / Self Care | Payer: Medicare Other | Attending: Emergency Medicine | Admitting: Emergency Medicine

## 2021-06-26 ENCOUNTER — Encounter (HOSPITAL_BASED_OUTPATIENT_CLINIC_OR_DEPARTMENT_OTHER): Payer: Self-pay | Admitting: Urology

## 2021-06-26 DIAGNOSIS — S299XXA Unspecified injury of thorax, initial encounter: Secondary | ICD-10-CM | POA: Diagnosis present

## 2021-06-26 DIAGNOSIS — S2241XA Multiple fractures of ribs, right side, initial encounter for closed fracture: Secondary | ICD-10-CM | POA: Diagnosis not present

## 2021-06-26 DIAGNOSIS — Y93K1 Activity, walking an animal: Secondary | ICD-10-CM | POA: Insufficient documentation

## 2021-06-26 DIAGNOSIS — S63501A Unspecified sprain of right wrist, initial encounter: Secondary | ICD-10-CM

## 2021-06-26 DIAGNOSIS — W19XXXA Unspecified fall, initial encounter: Secondary | ICD-10-CM | POA: Insufficient documentation

## 2021-06-26 DIAGNOSIS — R0781 Pleurodynia: Secondary | ICD-10-CM | POA: Diagnosis present

## 2021-06-26 MED ORDER — HYDROCODONE-ACETAMINOPHEN 5-325 MG PO TABS
1.0000 | ORAL_TABLET | Freq: Once | ORAL | Status: AC
  Administered 2021-06-26: 1 via ORAL
  Filled 2021-06-26: qty 1

## 2021-06-26 MED ORDER — HYDROCODONE-ACETAMINOPHEN 5-325 MG PO TABS: 1.0000 | ORAL_TABLET | Freq: Four times a day (QID) | ORAL | 0 refills | Status: DC | PRN

## 2021-06-26 NOTE — ED Triage Notes (Signed)
Mechanical fall at ground level while walking the dog ?States right rib pain and abrasion to right hand ?States right wrist pain as well  ? ?Denies Hitting head, no LOC  ?No blood thinners ?

## 2021-06-26 NOTE — ED Notes (Signed)
Teaching provided on use of Incentive Spirometer via RT.  Verbalized understanding of discharge instructions.   ?

## 2021-06-26 NOTE — ED Provider Notes (Signed)
? ?[facility] EMERGENCY DEPARTMENT  ?Provider Note ? ?CSN: [id number] ?Arrival date & time: 06/26/21 0056 ? ?History ?Chief Complaint  ?Patient presents with  ? Fall  ? ? ?Levi Phillips is a 81 y.o. male reports he fell walking a dog just prior to arrival landing on outstretched R hand and injuring his R wrist and R lateral ribs. He denies any head injury or LOC. Complaining of pain in R wrist, worse with movement and R rib worse with deep breath. He took a gabapentin (left over from prior sciatica) for pain without much improvement.  ? ? ?Home Medications ?Prior to Admission medications   ?Medication Sig Start Date End Date Taking? Authorizing Provider  ?allopurinol (ZYLOPRIM) 100 MG tablet Take 1 tablet by mouth once daily 06/11/21   Georgina Quint, MD  ?ALPRAZolam Prudy Feeler) 0.25 MG tablet Take 1 tablet (0.25 mg total) by mouth 2 (two) times daily as needed for anxiety. 08/02/20   Georgina Quint, MD  ?diazepam (VALIUM) 5 MG tablet Take 1 tablet (5 mg total) by mouth every 12 (twelve) hours as needed for anxiety. 12/07/20   Tyrell Antonio, MD  ?diclofenac Sodium (VOLTAREN) 1 % GEL Apply 4 g topically 4 (four) times daily. 11/05/20   Georgina Quint, MD  ?gabapentin (NEURONTIN) 100 MG capsule Take 1 capsule (100 mg total) by mouth 2 (two) times daily for 7 days. ?Patient not taking: Reported on 02/28/2021 10/25/20 11/01/20  Gailen Shelter, PA  ?gabapentin (NEURONTIN) 300 MG capsule Take 1 capsule (300 mg total) by mouth 2 (two) times daily for 4 days, THEN 1 capsule (300 mg total) 3 (three) times daily for 26 days. ?Patient not taking: Reported on 02/28/2021 11/11/20 12/11/20  Kerrin Champagne, MD  ?HYDROcodone-acetaminophen (NORCO/VICODIN) 5-325 MG tablet Take 1 tablet by mouth every 6 (six) hours as needed for moderate pain. 06/26/21   Pollyann Savoy, MD  ?methylPREDNISolone (MEDROL DOSEPAK) 4 MG TBPK tablet Take as directed 6 day dose pak 11/11/20   Kerrin Champagne, MD  ?pantoprazole  (PROTONIX) 40 MG tablet Take 1 tablet (40 mg total) by mouth daily. 02/26/20   Peyton Najjar, MD  ?polyethylene glycol (MIRALAX) 17 g packet Take 17 g by mouth daily. 11/11/20   Kerrin Champagne, MD  ?tamsulosin (FLOMAX) 0.4 MG CAPS capsule Take 1 capsule (0.4 mg total) by mouth daily. 03/15/21   Georgina Quint, MD  ? ? ? ?Allergies    ?Patient has no known allergies. ? ? ?Review of Systems   ?Review of Systems ?Please see HPI for pertinent positives and negatives ? ?Physical Exam ?BP 129/83 (BP Location: Right Arm)   Pulse 87   Temp 97.9 ?F (36.6 ?C) (Oral)   Resp 20   Ht 6\' 2"  (1.88 m)   Wt 79.8 kg   SpO2 99%   BMI 22.59 kg/m?  ? ?Physical Exam ?Vitals and nursing note reviewed.  ?Constitutional:   ?   Appearance: Normal appearance.  ?HENT:  ?   Head: Normocephalic and atraumatic.  ?   Nose: Nose normal.  ?   Mouth/Throat:  ?   Mouth: Mucous membranes are moist.  ?Eyes:  ?   Extraocular Movements: Extraocular movements intact.  ?   Conjunctiva/sclera: Conjunctivae normal.  ?Cardiovascular:  ?   Rate and Rhythm: Normal rate.  ?Pulmonary:  ?   Effort: Pulmonary effort is normal.  ?   Breath sounds: Normal breath sounds.  ?Chest:  ?   Chest wall: Tenderness (  R lateral mid chest/rib) present.  ?Abdominal:  ?   General: Abdomen is flat.  ?   Palpations: Abdomen is soft.  ?   Tenderness: There is no abdominal tenderness.  ?Musculoskeletal:     ?   General: Swelling and tenderness (R radial wrist) present. Normal range of motion.  ?   Cervical back: Neck supple.  ?Skin: ?   General: Skin is warm and dry.  ?   Findings: No rash (on exposed skin).  ?   Comments: Abrasion palm of R hand  ?Neurological:  ?   General: No focal deficit present.  ?   Mental Status: He is alert and oriented to person, place, and time.  ?Psychiatric:     ?   Mood and Affect: Mood normal.  ? ? ?ED Results / Procedures / Treatments   ?EKG ?None ? ?Procedures ?Procedures ? ?Medications Ordered in the ED ?Medications   ?HYDROcodone-acetaminophen (NORCO/VICODIN) 5-325 MG per tablet 1 tablet (has no administration in time range)  ?HYDROcodone-acetaminophen (NORCO/VICODIN) 5-325 MG per tablet 1 tablet (1 tablet Oral Given 06/26/21 0135)  ? ? ?Initial Impression and Plan ? Patient with mechanical fall, R wrist and rib injuries, will send for xray. Norco for pain.  ? ?ED Course  ? ?Clinical Course as of 06/26/21 0220  ?Mon Jun 26, 2021  ?0215 I personally viewed the images from radiology studies and agree with radiologist interpretation: Wrist is neg for fracture will place in wrist brace for comfort. Ribs are fractured. Discussed pain control, splinting and expected course with patient and wife. Questions answered. Additional pain medication given here to get him through the night and Rx sent to pharmacy. PCP follow up.  ? [CS]  ?  ?Clinical Course User Index ?[CS] Pollyann Savoy, MD  ? ? ? ?MDM Rules/Calculators/A&P ?Medical Decision Making ?Problems Addressed: ?Closed fracture of multiple ribs of right side, initial encounter: acute illness or injury ?Fall, initial encounter: acute illness or injury ?Sprain of right wrist, initial encounter: acute illness or injury ? ?Amount and/or Complexity of Data Reviewed ?Radiology: ordered and independent interpretation performed. Decision-making details documented in ED Course. ? ?Risk ?Prescription drug management. ? ? ? ?Final Clinical Impression(s) / ED Diagnoses ?Final diagnoses:  ?Fall, initial encounter  ?Closed fracture of multiple ribs of right side, initial encounter  ?Sprain of right wrist, initial encounter  ? ? ?Rx / DC Orders ?ED Discharge Orders   ? ?      Ordered  ?  HYDROcodone-acetaminophen (NORCO/VICODIN) 5-325 MG tablet  Every 6 hours PRN       ? 06/26/21 0219  ? ?  ?  ? ?  ? ?  ?Pollyann Savoy, MD ?06/26/21 [phone] ? ?

## 2021-07-05 ENCOUNTER — Ambulatory Visit (INDEPENDENT_AMBULATORY_CARE_PROVIDER_SITE_OTHER): Payer: Medicare Other | Admitting: Emergency Medicine

## 2021-07-05 ENCOUNTER — Ambulatory Visit (INDEPENDENT_AMBULATORY_CARE_PROVIDER_SITE_OTHER): Payer: Medicare Other

## 2021-07-05 ENCOUNTER — Encounter: Payer: Self-pay | Admitting: Emergency Medicine

## 2021-07-05 ENCOUNTER — Telehealth: Payer: Self-pay | Admitting: Emergency Medicine

## 2021-07-05 VITALS — BP 112/66 | HR 78 | Temp 98.4°F | Ht 70.08 in | Wt 185.5 lb

## 2021-07-05 DIAGNOSIS — Z87438 Personal history of other diseases of male genital organs: Secondary | ICD-10-CM

## 2021-07-05 DIAGNOSIS — S2241XA Multiple fractures of ribs, right side, initial encounter for closed fracture: Secondary | ICD-10-CM | POA: Diagnosis not present

## 2021-07-05 MED ORDER — TAMSULOSIN HCL 0.4 MG PO CAPS: 0.4000 mg | ORAL_CAPSULE | Freq: Every day | ORAL | 1 refills | Status: DC

## 2021-07-05 NOTE — Patient Instructions (Signed)
Rib Fracture  A rib fracture is a break or crack in one of the bones of the ribs. The ribs are like a cage that goes around your upper chest. A broken or cracked rib is often painful, but most do not cause other problems. Most rib fractures usually heal on their own in 1-3 months. What are the causes? Doing movements over and over again with a lot of force, such as pitching a baseball or having a very bad cough. A direct hit to the chest. Cancer that has spread to the bones. What are the signs or symptoms? Pain when you breathe in or cough. Pain when someone presses on the injured area. Feeling short of breath. How is this treated? Treatment depends on how bad the fracture is. In general: Most rib fractures usually heal on their own in 1-3 months. Healing may take longer if you have a cough or are doing activities that make the injury worse. While you heal, you may be given medicines to control pain. You will also be taught deep breathing exercises. Very bad injuries may require a stay at the hospital or surgery. Follow these instructions at home: Managing pain, stiffness, and swelling If told, put ice on the injured area. To do this: Put ice in a plastic bag. Place a towel between your skin and the bag. Leave the ice on for 20 minutes, 2-3 times a day. Take off the ice if your skin turns bright red. This is very important. If you cannot feel pain, heat, or cold, you have a greater risk of damage to the area. Take over-the-counter and prescription medicines only as told by your doctor. Activity Avoid activities that cause pain to the injured area. Protect your injured area. Slowly increase activity as told by your doctor. General instructions Do deep breathing exercises as told by your doctor. You may be told to: Take deep breaths many times a day. Cough several times a day while hugging a pillow. Use a device (incentive spirometer) to do deep breathing many times a day. Drink  enough fluid to keep your pee (urine) clear or pale yellow. Do not wear a rib belt or binder. Keep all follow-up visits. Contact a doctor if: You have a fever. Get help right away if: You have trouble breathing. You are short of breath. You cannot stop coughing. You cough up thick or bloody spit. You feel like you may vomit (nauseous), vomit, or have belly (abdominal) pain. Your pain gets worse and medicine does not help. These symptoms may be an emergency. Get help right away. Call your local emergency services (911 in the [place] Do not wait to see if the symptoms will go away. Do not drive yourself to the hospital. Summary A rib fracture is a break or crack in one of the bones of the ribs. Apply ice to the injured area and take medicines for pain as told by your doctor. Take deep breaths and cough several times a day. Hug a pillow every time you cough. This information is not intended to replace advice given to you by your health care provider. Make sure you discuss any questions you have with your health care provider. Document Revised: 06/19/2019 Document Reviewed: 06/19/2019 Elsevier Patient Education  2023 Elsevier Inc.  

## 2021-07-05 NOTE — Assessment & Plan Note (Signed)
Stable.  Clinically better.  No red flag signs or symptoms. ?Chest x-ray reviewed with patient.  No significant changes but no complications either.  Continue pain management. ?Follow-up with repeat rib x-rays in 4 weeks. ?

## 2021-07-05 NOTE — Telephone Encounter (Signed)
Chest x-ray report reviewed with patient. ?

## 2021-07-05 NOTE — Progress Notes (Signed)
Levi Phillips ?81 y.o. ? ? ?Chief Complaint  ?Patient presents with  ? Follow-up  ?  F/u after fall   ? Flank Pain  ?  Right flank pain   ? ? ?HISTORY OF PRESENT ILLNESS: ?This is a 81 y.o. male here for follow-up of emergency department visit on 06/26/2021 when he presented after a fall with a fractured rib and right wrist injury. ?X-rays show no wrist fracture and fractures of 6 7 and eighth ribs right side. ?Was given Norco for pain.  Feeling better today.  Progressing as expected. ?Also has mild pain to right flank area but no hematuria or any other associated symptoms. ? ?Flank Pain ?Pertinent negatives include no abdominal pain, chest pain, dysuria, fever or headaches.  ? ? ?Prior to Admission medications   ?Medication Sig Start Date End Date Taking? Authorizing Provider  ?allopurinol (ZYLOPRIM) 100 MG tablet Take 1 tablet by mouth once daily 06/11/21  Yes Jakeia Carreras, Eilleen Kempf, MD  ?tamsulosin (FLOMAX) 0.4 MG CAPS capsule Take 1 capsule (0.4 mg total) by mouth daily. 03/15/21  Yes Jamarkus Lisbon, Eilleen Kempf, MD  ?ALPRAZolam Prudy Feeler) 0.25 MG tablet Take 1 tablet (0.25 mg total) by mouth 2 (two) times daily as needed for anxiety. ?Patient not taking: Reported on 07/05/2021 08/02/20   Georgina Quint, MD  ?diazepam (VALIUM) 5 MG tablet Take 1 tablet (5 mg total) by mouth every 12 (twelve) hours as needed for anxiety. ?Patient not taking: Reported on 07/05/2021 12/07/20   Tyrell Antonio, MD  ?diclofenac Sodium (VOLTAREN) 1 % GEL Apply 4 g topically 4 (four) times daily. ?Patient not taking: Reported on 07/05/2021 11/05/20   Georgina Quint, MD  ?gabapentin (NEURONTIN) 100 MG capsule Take 1 capsule (100 mg total) by mouth 2 (two) times daily for 7 days. ?Patient not taking: Reported on 02/28/2021 10/25/20 11/01/20  Gailen Shelter, PA  ?gabapentin (NEURONTIN) 300 MG capsule Take 1 capsule (300 mg total) by mouth 2 (two) times daily for 4 days, THEN 1 capsule (300 mg total) 3 (three) times daily for 26 days. ?Patient  not taking: Reported on 02/28/2021 11/11/20 12/11/20  Kerrin Champagne, MD  ?HYDROcodone-acetaminophen (NORCO/VICODIN) 5-325 MG tablet Take 1 tablet by mouth every 6 (six) hours as needed for moderate pain. ?Patient not taking: Reported on 07/05/2021 06/26/21   Pollyann Savoy, MD  ?methylPREDNISolone (MEDROL DOSEPAK) 4 MG TBPK tablet Take as directed 6 day dose pak ?Patient not taking: Reported on 07/05/2021 11/11/20   Kerrin Champagne, MD  ?pantoprazole (PROTONIX) 40 MG tablet Take 1 tablet (40 mg total) by mouth daily. ?Patient not taking: Reported on 07/05/2021 02/26/20   Peyton Najjar, MD  ?polyethylene glycol (MIRALAX) 17 g packet Take 17 g by mouth daily. ?Patient not taking: Reported on 07/05/2021 11/11/20   Kerrin Champagne, MD  ? ? ?No Known Allergies ? ?Patient Active Problem List  ? Diagnosis Date Noted  ? 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 Spinal stenosis of lumbar region 10/15/2017  ? Chronic bilateral low back pain with left-sided sciatica 10/15/2017  ? History of vertigo 05/09/2017  ? History of kidney stones 04/10/2017  ? Renal colic on left side 04/10/2017  ? DDD (degenerative disc disease), lumbar 01/09/2017  ? Gout 01/09/2017  ? BPH (benign prostatic hyperplasia) 01/09/2017  ? BMI 27.0-27.9,adult 01/09/2017  ? ? ?Past Medical History:  ?Diagnosis Date  ? Allergy   ? Anemia   ? Arthritis   ? Back pain   ? spondylolisthesis  ? Enlarged  prostate   ? takes Flomax daily  ? Gout   ? takes Allopurinol daily  ? ? ?Past Surgical History:  ?Procedure Laterality Date  ? cataract surgery Bilateral   ? EYE SURGERY    ? LITHOTRIPSY    ? done in [place]  ? ? ?Social History  ? ?Socioeconomic History  ? Marital status: Married  ?  Spouse name: Not on file  ? Number of children: Not on file  ? Years of education: Not on file  ? Highest education level: Not on file  ?Occupational History  ? Not on file  ?Tobacco Use  ? Smoking status: Never  ? Smokeless tobacco: Never  ?Vaping Use  ? Vaping Use: Never used  ?Substance and  Sexual Activity  ? Alcohol use: Yes  ?  Comment: occ beer  ? Drug use: No  ? Sexual activity: Not on file  ?Other Topics Concern  ? Not on file  ?Social History Narrative  ? ** Merged History Encounter **  ?    ? ?Social Determinants of Health  ? ?Financial Resource Strain: Not on file  ?Food Insecurity: Not on file  ?Transportation Needs: Not on file  ?Physical Activity: Not on file  ?Stress: Not on file  ?Social Connections: Not on file  ?Intimate Partner Violence: Not on file  ? ? ?Family History  ?Problem Relation Age of Onset  ? Cancer Mother   ? Alzheimer's disease Sister   ? Alzheimer's disease Sister   ? ? ? ?Review of Systems  ?Constitutional: Negative.  Negative for chills and fever.  ?HENT: Negative.  Negative for congestion and sore throat.   ?Respiratory: Negative.  Negative for cough and shortness of breath.   ?Cardiovascular: Negative.  Negative for chest pain and palpitations.  ?Gastrointestinal: Negative.  Negative for abdominal pain, diarrhea, nausea and vomiting.  ?Genitourinary:  Positive for flank pain. Negative for dysuria and hematuria.  ?Skin: Negative.  Negative for rash.  ?Neurological:  Negative for dizziness and headaches.  ?All other systems reviewed and are negative. ? ?Today's Vitals  ? 07/05/21 1352  ?BP: 112/66  ?Pulse: 78  ?Temp: 98.4 ?F (36.9 ?C)  ?TempSrc: Oral  ?SpO2: 95%  ?Weight: 185 lb 8 oz (84.1 kg)  ?Height: 5' 10.08" (1.78 m)  ? ?Body mass index is 26.56 kg/m?. ? ?Physical Exam ?Vitals reviewed.  ?Constitutional:   ?   Appearance: Normal appearance.  ?HENT:  ?   Head: Normocephalic.  ?Eyes:  ?   Extraocular Movements: Extraocular movements intact.  ?   Pupils: Pupils are equal, round, and reactive to light.  ?Cardiovascular:  ?   Rate and Rhythm: Normal rate and regular rhythm.  ?   Pulses: Normal pulses.  ?   Heart sounds: Normal heart sounds.  ?Pulmonary:  ?   Effort: Pulmonary effort is normal.  ?   Breath sounds: Normal breath sounds.  ?Chest:  ?   Chest wall:  Tenderness (Right mid lower anterior rib cage) present.  ?Abdominal:  ?   Palpations: Abdomen is soft.  ?   Tenderness: There is no abdominal tenderness. There is no right CVA tenderness or left CVA tenderness.  ?Musculoskeletal:     ?   General: Normal range of motion.  ?   Cervical back: No tenderness.  ?Lymphadenopathy:  ?   Cervical: No cervical adenopathy.  ?Skin: ?   General: Skin is warm and dry.  ?   Capillary Refill: Capillary refill takes less than 2 seconds.  ?Neurological:  ?  General: No focal deficit present.  ?   Mental Status: He is alert and oriented to person, place, and time.  ?Psychiatric:     ?   Mood and Affect: Mood normal.     ?   Behavior: Behavior normal.  ? ?DG Ribs Unilateral W/Chest Right ? ?Result Date: 07/05/2021 ?CLINICAL DATA:  Follow-up rib fracture EXAM: RIGHT RIBS AND CHEST - 3+ VIEW COMPARISON:  06/26/2021 FINDINGS: No significant change in mildly displaced fractures of the lateral right sixth through eighth ribs. There is no evidence of pneumothorax or pleural effusion. Mild, diffuse bilateral interstitial pulmonary opacity, unchanged. Heart size and mediastinal contours are within normal limits. IMPRESSION: 1. No significant change in mildly displaced fractures of the lateral right sixth through eighth ribs. No pneumothorax or pleural effusion. 2. 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 Electronically Signed   By: Jearld Lesch M.D.   On: 07/05/2021 14:39   ? ? ?ASSESSMENT & PLAN: ?Problem List Items Addressed This Visit   ? ?  ? Musculoskeletal and Integument  ? Multiple closed fractures of ribs of right side - Primary  ?  Stable.  Clinically better.  No red flag signs or symptoms. ?Chest x-ray reviewed with patient.  No significant changes but no complications either.  Continue pain management. ?Follow-up with repeat rib x-rays in 4 weeks. ? ?  ?  ? Relevant Orders  ? DG Ribs Unilateral W/Chest Right  (Completed)  ?  ? Other  ? History of BPH  ? Relevant Medications  ? tamsulosin (FLOMAX) 0.4 MG CAPS capsule  ? ?Patient Instructions  ?Rib Fracture ? ?A rib fracture is a break or crack in one of the bones of the ri

## 2021-11-07 ENCOUNTER — Telehealth (INDEPENDENT_AMBULATORY_CARE_PROVIDER_SITE_OTHER): Payer: Medicare Other | Admitting: Family Medicine

## 2021-11-07 ENCOUNTER — Telehealth: Payer: Self-pay

## 2021-11-07 VITALS — Ht 70.08 in | Wt 172.0 lb

## 2021-11-07 DIAGNOSIS — K5900 Constipation, unspecified: Secondary | ICD-10-CM

## 2021-11-07 DIAGNOSIS — J029 Acute pharyngitis, unspecified: Secondary | ICD-10-CM

## 2021-11-07 MED ORDER — BENZONATATE 100 MG PO CAPS: 100.0000 mg | ORAL_CAPSULE | Freq: Three times a day (TID) | ORAL | 0 refills | Status: DC | PRN

## 2021-11-07 NOTE — Progress Notes (Signed)
Virtual Visit via Telephone Note  I connected with Levi Phillips on 11/07/21 at  4:20 PM EDT by telephone and verified that I am speaking with the correct person using two identifiers.   I discussed the limitations of performing an evaluation and management service by telephone and requested permission for a phone visit. The patient expressed understanding and agreed to proceed.  Location patient:  [place] Location provider: work or home office Participants present for the call: patient, provider, Caregility interpreter service - interpreter ID [phone] Patient did not have a visit with me in the prior 7 days to address this/these issue(s).   History of Present Illness:  Acute telemedicine visit for several symptoms: -Onset: sore throat started several days ago, congested started "some time ago" - he is not sure when -Symptoms include: PND, congestion, severe sore throat that has worsened, headache, fever - started 3 days ago (denies fever currently), constipation for a couple days, fatigue, poor appetite, he is requesting inperson visit with his doctor. Reports wanted inperson visit and wanted covid testing and evaluation but was told that a doctor could just talk with him over the phone and then given him medicine. Does not have any way to do covid test at home. -reports he has kidney disease and sees a kidney specialist and was told he absolutely can not take tylenol as it will cause kidney stones. He does not know if he takes the norco on his medication list.  -daughter did have similar symptoms, she has been in bed for 5 days, she has tested negative for covid per his report -he coughs frequently during the visit, eports throat hurts when he coughs -Denies:CP, SOB, Vomiting, diarrhea, melena -Pertinent past medical history: see below -Pertinent medication allergies: No Known Allergies -COVID-19 vaccine status: Immunization History  Administered Date(s) Administered   Fluad Quad(high Dose  65+) 12/01/2018, 02/26/2020   Influenza-Unspecified 01/29/2021   PFIZER(Purple Top)SARS-COV-2 Vaccination 05/08/2019, 06/03/2019, 03/18/2020      Past Medical History:  Diagnosis Date   Allergy    Anemia    Arthritis    Back pain    spondylolisthesis   Enlarged prostate    takes Flomax daily   Gout    takes Allopurinol daily    Current Outpatient Medications on File Prior to Visit  Medication Sig Dispense Refill   allopurinol (ZYLOPRIM) 100 MG tablet Take 1 tablet by mouth once daily 90 tablet 0   tamsulosin (FLOMAX) 0.4 MG CAPS capsule Take 1 capsule (0.4 mg total) by mouth daily. 90 capsule 1   ALPRAZolam (XANAX) 0.25 MG tablet Take 1 tablet (0.25 mg total) by mouth 2 (two) times daily as needed for anxiety. (Patient not taking: Reported on 07/05/2021) 20 tablet 1   diazepam (VALIUM) 5 MG tablet Take 1 tablet (5 mg total) by mouth every 12 (twelve) hours as needed for anxiety. (Patient not taking: Reported on 07/05/2021) 14 tablet 0   diclofenac Sodium (VOLTAREN) 1 % GEL Apply 4 g topically 4 (four) times daily. (Patient not taking: Reported on 07/05/2021) 100 g 0   HYDROcodone-acetaminophen (NORCO/VICODIN) 5-325 MG tablet Take 1 tablet by mouth every 6 (six) hours as needed for moderate pain. (Patient not taking: Reported on 07/05/2021) 30 tablet 0   pantoprazole (PROTONIX) 40 MG tablet Take 1 tablet (40 mg total) by mouth daily. (Patient not taking: Reported on 07/05/2021) 15 tablet 0   polyethylene glycol (MIRALAX) 17 g packet Take 17 g by mouth daily. (Patient not taking: Reported on 07/05/2021) 24  each 0   No current facility-administered medications on file prior to visit.    Observations/Objective: Patient sounds cheerful and well on the phone. I do not appreciate any SOB. Patient does cough quite a bit throughout the visit.  Speech and thought processing are grossly intact. Patient reported vitals:denies fever currently  Assessment and Plan:  Sore throat  Constipation,  unspecified constipation type  -we discussed possible serious and likely etiologies, options for evaluation and workup, limitations of telemedicine visit vs in person visit, treatment, treatment risks and precautions. Query covid (as have seen many cases this week with similar symptoms), VURI vs other and constipation. He wants testing and evaluation. Discussed options at length and he prefers inperson visit with PCP office tomorrow. Declines UCC and also he does not feel he needs 911 or ER.  Requires transportation. He is frustrated as reports he knew he needed inperson visit as he desires covid testing, but the person on the phone told him he can just talk on the phone and get a prescription to make him better. He doesn't think is covid since his daughter tested negative. Discussed symptomatic care for symptoms, mirilax for constipation and sent Tessalon rx as coughing makes his throat hurt. Advised of other options for constipation OTC as well as he doesn't know if mirilax will work for him.  Sent message to staff at [facility] to assist with his request for an inperson visit tomorrow.  Advised to seek prompt  in person care if worsening, new symptoms arise. Did let this patient know that I do telemedicine on Tuesdays and Thursdays for [facility] and those are the days I am logged into the system.   I discussed the assessment and treatment plan with the patient. The patient was provided an opportunity to ask questions and all were answered. The patient agreed with the plan and demonstrated an understanding of the instructions.    Follow Up Instructions:  I did not refer this patient for an OV with me in the next 24 hours for this/these issue(s).  I discussed the assessment and treatment plan with the patient. The patient was provided an opportunity to ask questions and all were answered. The patient agreed with the plan and demonstrated an understanding of the instructions.   I spent 45 minutes on the  date of this visit in the care of this patient. See summary of tasks completed to properly care for this patient in the detailed notes above which also included counseling of above, review of PMH, medications, allergies, evaluation of the patient and ordering and/or  instructing patient on testing and care options.     Terressa Koyanagi, DO

## 2021-11-07 NOTE — Patient Instructions (Signed)
-  I sent a message to the office letting them know about your request for an inperson visit tomorrow. Please contact the office if they do not call you in the morning.  -I sent the medication(s) we discussed to your pharmacy: Meds ordered this encounter  Medications   benzonatate (TESSALON PERLES) 100 MG capsule    Sig: Take 1 capsule (100 mg total) by mouth 3 (three) times daily as needed.    Dispense:  20 capsule    Refill:  0   Can gargle warm salt water twice daily for sore throat. Throat lozenges may also help.  For constipation can try Mirilax, one capful in a large glass of water twice daily for 3 days.    I hope you are feeling better soon!  Seek in person care promptly if your symptoms worsen, new concerns arise or you are not improving with treatment.  It was nice to meet you today. I help [facility] out with telemedicine visits on Tuesdays and Thursdays and am happy to help if you need a virtual follow up visit on those days. Otherwise, if you have any concerns or questions following this visit please schedule a follow up visit with your Primary Care office or seek care at a local urgent care clinic to avoid delays in care. If you are having severe or life threatening symptoms please call 911 and/or go to the nearest emergency room.

## 2021-11-08 ENCOUNTER — Emergency Department (HOSPITAL_BASED_OUTPATIENT_CLINIC_OR_DEPARTMENT_OTHER)
Admission: EM | Admit: 2021-11-08 | Discharge: 2021-11-08 | Disposition: A | Discharge status: Home / Self Care | Payer: Medicare Other | Attending: Emergency Medicine | Admitting: Emergency Medicine

## 2021-11-08 ENCOUNTER — Emergency Department (HOSPITAL_BASED_OUTPATIENT_CLINIC_OR_DEPARTMENT_OTHER): Payer: Medicare Other

## 2021-11-08 ENCOUNTER — Other Ambulatory Visit: Payer: Self-pay

## 2021-11-08 ENCOUNTER — Encounter (HOSPITAL_BASED_OUTPATIENT_CLINIC_OR_DEPARTMENT_OTHER): Payer: Self-pay

## 2021-11-08 ENCOUNTER — Telehealth: Payer: Self-pay

## 2021-11-08 DIAGNOSIS — R059 Cough, unspecified: Secondary | ICD-10-CM | POA: Diagnosis present

## 2021-11-08 DIAGNOSIS — R0602 Shortness of breath: Secondary | ICD-10-CM | POA: Insufficient documentation

## 2021-11-08 DIAGNOSIS — U071 COVID-19: Secondary | ICD-10-CM | POA: Diagnosis not present

## 2021-11-08 LAB — SARS CORONAVIRUS 2 BY RT PCR: SARS Coronavirus 2 by RT PCR: POSITIVE — AB

## 2021-11-08 MED ORDER — ALBUTEROL SULFATE HFA 108 (90 BASE) MCG/ACT IN AERS
8.0000 | INHALATION_SPRAY | Freq: Once | RESPIRATORY_TRACT | Status: AC
  Administered 2021-11-08: 8 via RESPIRATORY_TRACT
  Filled 2021-11-08: qty 6.7

## 2021-11-08 MED ORDER — IBUPROFEN 600 MG PO TABS: 600.0000 mg | ORAL_TABLET | Freq: Four times a day (QID) | ORAL | 0 refills | Status: DC | PRN

## 2021-11-08 MED ORDER — NIRMATRELVIR/RITONAVIR (PAXLOVID)TABLET: 3.0000 | ORAL_TABLET | Freq: Two times a day (BID) | ORAL | Status: DC

## 2021-11-08 MED ORDER — DEXAMETHASONE 4 MG PO TABS
10.0000 mg | ORAL_TABLET | Freq: Every day | ORAL | Status: DC
  Administered 2021-11-08: 10 mg via ORAL
  Filled 2021-11-08: qty 3

## 2021-11-08 MED ORDER — NIRMATRELVIR/RITONAVIR (PAXLOVID)TABLET: 3.0000 | ORAL_TABLET | Freq: Two times a day (BID) | ORAL | 0 refills | Status: AC

## 2021-11-08 MED ORDER — SODIUM CHLORIDE 0.9 % IV SOLN: INTRAVENOUS | Status: DC | PRN

## 2021-11-08 MED ORDER — NIRMATRELVIR/RITONAVIR (PAXLOVID)TABLET: 3.0000 | ORAL_TABLET | Freq: Two times a day (BID) | ORAL | 0 refills | Status: DC

## 2021-11-08 MED ORDER — LIDOCAINE VISCOUS HCL 2 % MT SOLN
15.0000 mL | Freq: Once | OROMUCOSAL | Status: AC
  Administered 2021-11-08: 15 mL via OROMUCOSAL
  Filled 2021-11-08: qty 15

## 2021-11-08 MED ORDER — IBUPROFEN 100 MG/5ML PO SUSP
600.0000 mg | Freq: Once | ORAL | Status: AC
  Administered 2021-11-08: 600 mg via ORAL
  Filled 2021-11-08: qty 30

## 2021-11-08 MED ORDER — SODIUM CHLORIDE 0.9 % IV SOLN: 100.0000 mg | Freq: Every day | INTRAVENOUS | Status: DC

## 2021-11-08 MED ORDER — SODIUM CHLORIDE 0.9 % IV SOLN
200.0000 mg | Freq: Once | INTRAVENOUS | Status: AC
  Administered 2021-11-08: 200 mg via INTRAVENOUS
  Filled 2021-11-08 (×2): qty 40

## 2021-11-08 NOTE — ED Provider Notes (Signed)
[facility] EMERGENCY DEPARTMENT Provider Note   CSN: [id number] Arrival date & time: 11/08/21  1100     History  Chief Complaint  Patient presents with   Shortness of Breath    Levi Phillips is a 81 y.o. male.  Patient is an 81 year old male presenting for sore throat, headache, shortness of breath, and cough x 3-4 days.  Patient admits to exposure to COVID-19 virus with several members of his family testing positive.  Patient admits to intermittent shortness of breath that was worse last night but none at this time.  The history is provided by the patient. No language interpreter was used.  Shortness of Breath Associated symptoms: cough, headaches and sore throat   Associated symptoms: no abdominal pain, no chest pain, no ear pain, no fever, no rash and no vomiting        Home Medications Prior to Admission medications   Medication Sig Start Date End Date Taking? Authorizing Provider  ibuprofen (ADVIL) 600 MG tablet Take 1 tablet (600 mg total) by mouth every 6 (six) hours as needed for fever, mild pain or moderate pain. 11/08/21  Yes Edwin Dada P, DO  allopurinol (ZYLOPRIM) 100 MG tablet Take 1 tablet by mouth once daily 06/11/21   Georgina Quint, MD  ALPRAZolam Prudy Feeler) 0.25 MG tablet Take 1 tablet (0.25 mg total) by mouth 2 (two) times daily as needed for anxiety. Patient not taking: Reported on 07/05/2021 08/02/20   Georgina Quint, MD  benzonatate (TESSALON PERLES) 100 MG capsule Take 1 capsule (100 mg total) by mouth 3 (three) times daily as needed. 11/07/21   Terressa Koyanagi, DO  diazepam (VALIUM) 5 MG tablet Take 1 tablet (5 mg total) by mouth every 12 (twelve) hours as needed for anxiety. Patient not taking: Reported on 07/05/2021 12/07/20   Tyrell Antonio, MD  diclofenac Sodium (VOLTAREN) 1 % GEL Apply 4 g topically 4 (four) times daily. Patient not taking: Reported on 07/05/2021 11/05/20   Georgina Quint, MD  HYDROcodone-acetaminophen  (NORCO/VICODIN) 5-325 MG tablet Take 1 tablet by mouth every 6 (six) hours as needed for moderate pain. Patient not taking: Reported on 07/05/2021 06/26/21   Pollyann Savoy, MD  pantoprazole (PROTONIX) 40 MG tablet Take 1 tablet (40 mg total) by mouth daily. Patient not taking: Reported on 07/05/2021 02/26/20   Peyton Najjar, MD  polyethylene glycol (MIRALAX) 17 g packet Take 17 g by mouth daily. Patient not taking: Reported on 07/05/2021 11/11/20   Kerrin Champagne, MD  tamsulosin (FLOMAX) 0.4 MG CAPS capsule Take 1 capsule (0.4 mg total) by mouth daily. 07/05/21   Georgina Quint, MD      Allergies    Patient has no known allergies.    Review of Systems   Review of Systems  Constitutional:  Positive for fatigue. Negative for chills and fever.  HENT:  Positive for sore throat. Negative for ear pain.   Eyes:  Negative for pain and visual disturbance.  Respiratory:  Positive for cough and shortness of breath.   Cardiovascular:  Negative for chest pain and palpitations.  Gastrointestinal:  Negative for abdominal pain and vomiting.  Genitourinary:  Negative for dysuria and hematuria.  Musculoskeletal:  Negative for arthralgias and back pain.  Skin:  Negative for color change and rash.  Neurological:  Positive for headaches. Negative for seizures and syncope.  All other systems reviewed and are negative.   Physical Exam Updated Vital Signs BP 122/71   Pulse 69  Temp 99.6 F (37.6 C) (Oral)   Resp 19   SpO2 92%  Physical Exam Vitals and nursing note reviewed.  Constitutional:      General: He is not in acute distress.    Appearance: He is well-developed.  HENT:     Head: Normocephalic and atraumatic.  Eyes:     Conjunctiva/sclera: Conjunctivae normal.  Cardiovascular:     Rate and Rhythm: Normal rate and regular rhythm.     Heart sounds: No murmur heard. Pulmonary:     Effort: Pulmonary effort is normal. No respiratory distress.     Breath sounds: Normal breath sounds.   Abdominal:     Palpations: Abdomen is soft.     Tenderness: There is no abdominal tenderness.  Musculoskeletal:        General: No swelling.     Cervical back: Neck supple.  Skin:    General: Skin is warm and dry.     Capillary Refill: Capillary refill takes less than 2 seconds.  Neurological:     Mental Status: He is alert.  Psychiatric:        Mood and Affect: Mood normal.     ED Results / Procedures / Treatments   Labs (all labs ordered are listed, but only abnormal results are displayed) Labs Reviewed  SARS CORONAVIRUS 2 BY RT PCR - Abnormal; Notable for the following components:      Result Value   SARS Coronavirus 2 by RT PCR POSITIVE (*)    All other components within normal limits    EKG None  Radiology DG Chest Portable 1 View  Result Date: 11/08/2021 CLINICAL DATA:  Provided history: Shortness of breath, cough, COVID exposure. EXAM: PORTABLE CHEST 1 VIEW COMPARISON:  Prior chest radiographs 07/05/2021 and earlier. FINDINGS: Heart size within normal limits. Aortic atherosclerosis. Basilar predominant prominence of the interstitial lung markings, similar to the prior chest radiograph 07/05/2021. No evidence of pleural effusion or pneumothorax. No acute bony abnormality identified. IMPRESSION: Basilar predominant prominence of the interstitial lung markings, similar to the prior chest radiograph of 07/05/2021. While there is suspicion for a component of chronic interstitial lung disease, superimposed atypical/viral pneumonia or interstitial edema cannot be excluded. Aortic Atherosclerosis (ICD10-I70.0). Electronically Signed   By: Jackey Loge D.O.   On: 11/08/2021 11:42    Procedures Procedures    Medications Ordered in ED Medications  dexamethasone (DECADRON) tablet 10 mg (10 mg Oral Given 11/08/21 1325)  remdesivir 200 mg in sodium chloride 0.9% 250 mL IVPB (200 mg Intravenous New Bag/Given 11/08/21 1325)    Followed by  remdesivir 100 mg in sodium chloride 0.9 %  100 mL IVPB (has no administration in time range)  0.9 %  sodium chloride infusion ( Intravenous New Bag/Given 11/08/21 1322)  lidocaine (XYLOCAINE) 2 % viscous mouth solution 15 mL (has no administration in time range)  ibuprofen (ADVIL) 100 MG/5ML suspension 600 mg (600 mg Oral Given 11/08/21 1326)  albuterol (VENTOLIN HFA) 108 (90 Base) MCG/ACT inhaler 8 puff (8 puffs Inhalation Given 11/08/21 1255)    ED Course/ Medical Decision Making/ A&P                           Medical Decision Making Amount and/or Complexity of Data Reviewed Radiology: ordered.  Risk Prescription drug management.   34:67 PM 81 year old male presenting for sore throat, headache, shortness of breath, and cough x 3-4 days.  Patient is alert and oriented x3, no acute distress,  afebrile, stable vital signs.  Physical exam demonstrates clear oropharynx.  Midline uvula.  Chest x-ray suggestive of viral pneumonia.  Patient positive for COVID-19 virus.  Remdesivir given.  Albuterol given for shortness of breath.  Medication given for throat pain.   Scription sent to pharmacy for symptomatic relief.  Patient in no distress and overall condition improved here in the ED. Detailed discussions were had with the patient regarding current findings, and need for close f/u with PCP or on call doctor. The patient has been instructed to return immediately if the symptoms worsen in any way for re-evaluation. Patient verbalized understanding and is in agreement with current care plan. All questions answered prior to discharge.         Final Clinical Impression(s) / ED Diagnoses Final diagnoses:  COVID    Rx / DC Orders ED Discharge Orders          Ordered    ibuprofen (ADVIL) 600 MG tablet  Every 6 hours PRN        11/08/21 1413              Franne Forts, DO 11/08/21 1417

## 2021-11-08 NOTE — Discharge Instructions (Addendum)
Use albuterol inhaler 2 puffs every 4 hours as needed for shortness of breath.  Take Motrin 600 mg every 6 hours as needed for throat pain, fevers, or muscle aches.   Rest and stay hydrated  To stay in your house and decrease exposure with healthy individuals over the next 7 to 10 days

## 2021-11-08 NOTE — ED Triage Notes (Addendum)
Pt BIB EMS, c/o shortness of breath and sore throat since yesterday. Lives with 2 people who tested positive for covid. Also c/o headache and constipation.  Persian video interpreter used during triage.

## 2021-11-08 NOTE — Telephone Encounter (Signed)
FYI: Pt called in to set-up an appointment to be seen following a VV he had on 11/07/2021. However, upon Effie Berkshire trying to take the call she noticed the pt was struggling to breathe and asked that I assist her with the call. At that point I was able to get on the phone with the pt who was in respiratory distress and unable to form a his words clearly. At that point I informed the pt I will be calling 911 and for him to stay on the line. I was bale to dispatch 911 and give them the pts information what was going on and have them go to his home. I stayed on the line with the pt and 911 until I heard the EMS persons arrive and can hear them helping the pt. Once I confirmed it was EMS with the pt I then told the pt I will be hanging up and that he will be okay cause they were there to help him and see that he gets the care needed. Pt stated "thank you, thank you for your help." I told him I am now hanging up and he said "ok."

## 2021-11-08 NOTE — Telephone Encounter (Signed)
Thank you.  Good job.

## 2021-11-27 ENCOUNTER — Telehealth: Payer: Self-pay | Admitting: Emergency Medicine

## 2021-11-27 NOTE — Telephone Encounter (Signed)
Patient would like to get his Flomax refilled - Please send to [facility] on [address]

## 2021-12-14 ENCOUNTER — Telehealth: Payer: Self-pay

## 2021-12-14 NOTE — Telephone Encounter (Signed)
LVM for patient to schedule AWV.

## 2021-12-25 ENCOUNTER — Encounter: Payer: Self-pay | Admitting: Emergency Medicine

## 2021-12-25 ENCOUNTER — Ambulatory Visit (INDEPENDENT_AMBULATORY_CARE_PROVIDER_SITE_OTHER): Payer: Medicare Other | Admitting: Emergency Medicine

## 2021-12-25 ENCOUNTER — Telehealth: Payer: Self-pay

## 2021-12-25 VITALS — BP 110/72 | HR 67 | Temp 98.1°F | Ht 70.0 in | Wt 181.4 lb

## 2021-12-25 DIAGNOSIS — Z13220 Encounter for screening for lipoid disorders: Secondary | ICD-10-CM | POA: Diagnosis not present

## 2021-12-25 DIAGNOSIS — Z1329 Encounter for screening for other suspected endocrine disorder: Secondary | ICD-10-CM

## 2021-12-25 DIAGNOSIS — I776 Arteritis, unspecified: Secondary | ICD-10-CM | POA: Diagnosis not present

## 2021-12-25 DIAGNOSIS — Z0001 Encounter for general adult medical examination with abnormal findings: Secondary | ICD-10-CM | POA: Diagnosis not present

## 2021-12-25 DIAGNOSIS — Z130 Encounter for screening for diseases of the blood and blood-forming organs and certain disorders involving the immune mechanism: Secondary | ICD-10-CM

## 2021-12-25 DIAGNOSIS — Z87438 Personal history of other diseases of male genital organs: Secondary | ICD-10-CM | POA: Diagnosis not present

## 2021-12-25 DIAGNOSIS — Z8739 Personal history of other diseases of the musculoskeletal system and connective tissue: Secondary | ICD-10-CM | POA: Diagnosis not present

## 2021-12-25 DIAGNOSIS — Z13228 Encounter for screening for other metabolic disorders: Secondary | ICD-10-CM | POA: Diagnosis not present

## 2021-12-25 LAB — CBC WITH DIFFERENTIAL/PLATELET
Basophils Absolute: 0 10*3/uL (ref 0.0–0.1)
Basophils Relative: 0.6 % (ref 0.0–3.0)
Eosinophils Absolute: 0.4 10*3/uL (ref 0.0–0.7)
Eosinophils Relative: 5 % (ref 0.0–5.0)
HCT: 42.8 % (ref 39.0–52.0)
Hemoglobin: 14.3 g/dL (ref 13.0–17.0)
Lymphocytes Relative: 28.2 % (ref 12.0–46.0)
Lymphs Abs: 2.2 10*3/uL (ref 0.7–4.0)
MCHC: 33.5 g/dL (ref 30.0–36.0)
MCV: 87.7 fl (ref 78.0–100.0)
Monocytes Absolute: 0.6 10*3/uL (ref 0.1–1.0)
Monocytes Relative: 8.3 % (ref 3.0–12.0)
Neutro Abs: 4.5 10*3/uL (ref 1.4–7.7)
Neutrophils Relative %: 57.9 % (ref 43.0–77.0)
Platelets: 220 10*3/uL (ref 150.0–400.0)
RBC: 4.88 Mil/uL (ref 4.22–5.81)
RDW: 14.6 % (ref 11.5–15.5)
WBC: 7.7 10*3/uL (ref 4.0–10.5)

## 2021-12-25 LAB — LIPID PANEL
Cholesterol: 161 mg/dL (ref 0–200)
HDL: 41.3 mg/dL (ref >39.00)
LDL Cholesterol: 108 mg/dL — ABNORMAL HIGH (ref 0–99)
NonHDL: 119.68
Total CHOL/HDL Ratio: 4
Triglycerides: 59 mg/dL (ref 0.0–149.0)
VLDL: 11.8 mg/dL (ref 0.0–40.0)

## 2021-12-25 LAB — COMPREHENSIVE METABOLIC PANEL
ALT: 16 U/L (ref 0–53)
AST: 17 U/L (ref 0–37)
Albumin: 4.6 g/dL (ref 3.5–5.2)
Alkaline Phosphatase: 63 U/L (ref 39–117)
BUN: 14 mg/dL (ref 6–23)
CO2: 29 mEq/L (ref 19–32)
Calcium: 9.5 mg/dL (ref 8.4–10.5)
Chloride: 102 mEq/L (ref 96–112)
Creatinine, Ser: 1.01 mg/dL (ref 0.40–1.50)
GFR: 69.57 mL/min (ref >60.00)
Glucose, Bld: 98 mg/dL (ref 70–99)
Potassium: 4.6 mEq/L (ref 3.5–5.1)
Sodium: 137 mEq/L (ref 135–145)
Total Bilirubin: 0.8 mg/dL (ref 0.2–1.2)
Total Protein: 8 g/dL (ref 6.0–8.3)

## 2021-12-25 LAB — HEMOGLOBIN A1C: Hgb A1c MFr Bld: 5.9 % (ref 4.6–6.5)

## 2021-12-25 LAB — SEDIMENTATION RATE: Sed Rate: 30 mm/hr — ABNORMAL HIGH (ref 0–20)

## 2021-12-25 MED ORDER — ALLOPURINOL 100 MG PO TABS: 100.0000 mg | ORAL_TABLET | Freq: Every day | ORAL | 3 refills | Status: DC

## 2021-12-25 MED ORDER — PREDNISONE 20 MG PO TABS: 20.0000 mg | ORAL_TABLET | Freq: Every day | ORAL | 0 refills | Status: AC

## 2021-12-25 NOTE — Progress Notes (Signed)
Levi Phillips 81 y.o.   Chief Complaint  Patient presents with   Annual Exam    Rash on legs     HISTORY OF PRESENT ILLNESS: This is a 81 y.o. male here for annual physical Complaining of itchy rash to lower extremities for the past week.  Started taking antibiotic that wife gave him the last couple days. Otherwise doing well. No other complaints or medical concerns today.  HPI   Prior to Admission medications   Medication Sig Start Date End Date Taking? Authorizing Provider  allopurinol (ZYLOPRIM) 100 MG tablet Take 1 tablet by mouth once daily 06/11/21  Yes Karee Forge, Eilleen Kempf, MD  ibuprofen (ADVIL) 600 MG tablet Take 1 tablet (600 mg total) by mouth every 6 (six) hours as needed for fever, mild pain or moderate pain. 11/08/21  Yes Edwin Dada P, DO  tamsulosin (FLOMAX) 0.4 MG CAPS capsule Take 1 capsule (0.4 mg total) by mouth daily. 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   History of vertigo 05/09/2017   History of kidney stones 04/10/2017   DDD (degenerative disc disease), lumbar 01/09/2017   Gout 01/09/2017   BPH (benign prostatic hyperplasia) 01/09/2017   BMI 27.0-27.9,adult 01/09/2017    Past Medical History:  Diagnosis Date   Allergy    Anemia    Arthritis    Back pain    spondylolisthesis   Enlarged prostate    takes Flomax daily   Gout    takes Allopurinol daily    Past Surgical History:  Procedure Laterality Date   cataract surgery Bilateral    EYE SURGERY     LITHOTRIPSY     done in [place]    Social History   Socioeconomic History   Marital status: Married    Spouse name: Not on file   Number of children: Not on file   Years of education: Not on file   Highest education level: Not on  file  Occupational History   Not on file  Tobacco Use   Smoking status: Never   Smokeless tobacco: Never  Vaping Use   Vaping Use: Never used  Substance and Sexual Activity   Alcohol use: Yes    Comment: occ beer   Drug use: No   Sexual activity: Not on file  Other Topics Concern   Not on file  Social History Narrative   ** Merged History Encounter **       Social Determinants of Health   Financial Resource Strain: Not on file  Food Insecurity: Not on file  Transportation Needs: Not on file  Physical Activity: Not on file  Stress: Not on file  Social Connections: Not on file  Intimate Partner Violence: Not on file    Family History  Problem Relation Age of Onset   Cancer Mother    Alzheimer's disease Sister    Alzheimer's disease Sister      Review of Systems  Constitutional: Negative.  Negative for chills and fever.  HENT: Negative.  Negative for congestion and sore throat.   Respiratory: Negative.  Negative for cough and shortness of breath.   Gastrointestinal:  Negative for abdominal pain, nausea and vomiting.  Genitourinary: Negative.  Negative for dysuria.  Skin:  Positive for  itching and rash.       Rash to lower extremities  Neurological: Negative.  Negative for dizziness and headaches.  All other systems reviewed and are negative.   Today's Vitals   12/25/21 0955  BP: 110/72  Pulse: 67  Temp: 98.1 F (36.7 C)  TempSrc: Oral  SpO2: 97%  Weight: 181 lb 6 oz (82.3 kg)  Height: 5\' 10"  (1.778 m)   Body mass index is 26.02 kg/m. Wt Readings from Last 3 Encounters:  12/25/21 181 lb 6 oz (82.3 kg)  11/07/21 171 lb 15.3 oz (78 kg)  07/05/21 185 lb 8 oz (84.1 kg)    Physical Exam Vitals reviewed.  Constitutional:      Appearance: Normal appearance.  HENT:     Head: Normocephalic.     Right Ear: Tympanic membrane, ear canal and external ear normal.     Left Ear: Tympanic membrane, ear canal and external ear normal.     Mouth/Throat:      Mouth: Mucous membranes are moist.     Pharynx: Oropharynx is clear.  Eyes:     Extraocular Movements: Extraocular movements intact.     Conjunctiva/sclera: Conjunctivae normal.     Pupils: Pupils are equal, round, and reactive to light.  Cardiovascular:     Rate and Rhythm: Normal rate and regular rhythm.     Pulses: Normal pulses.     Heart sounds: Murmur heard.  Pulmonary:     Effort: Pulmonary effort is normal.     Breath sounds: Normal breath sounds.  Abdominal:     Palpations: Abdomen is soft.     Tenderness: There is no abdominal tenderness. There is no guarding.  Musculoskeletal:        General: Normal range of motion.     Cervical back: No tenderness.  Lymphadenopathy:     Cervical: No cervical adenopathy.  Skin:    General: Skin is warm and dry.     Findings: Rash present.     Comments: Vasculitic rash to lower extremities  Neurological:     General: No focal deficit present.     Mental Status: He is alert and oriented to person, place, and time.  Psychiatric:        Mood and Affect: Mood normal.        Behavior: Behavior normal.      ASSESSMENT & PLAN: Problem List Items Addressed This Visit       Cardiovascular and Mediastinum   Vasculitis ([place])    Acute problem.  Of lower extremities. Most likely viral etiology. May benefit from prednisone 20 mg daily for 5 days.      Relevant Medications   predniSONE (DELTASONE) 20 MG tablet   Other Relevant Orders   CBC with Differential   Sedimentation rate     Other   Gout   Relevant Medications   allopurinol (ZYLOPRIM) 100 MG tablet   History of BPH   Other Visit Diagnoses     Encounter for general adult medical examination with abnormal findings    -  Primary   Relevant Orders   Comprehensive metabolic panel   Hemoglobin A1c   Lipid panel   Screening for deficiency anemia       Relevant Orders   CBC with Differential   Screening for lipoid disorders       Relevant Orders   Lipid panel    Screening for endocrine, metabolic and immunity disorder       Relevant Orders   Comprehensive  metabolic panel   Hemoglobin A1c      Modifiable risk factors discussed with patient. Anticipatory guidance according to age provided. The following topics were also discussed: Social Determinants of Health Smoking.  Non-smoker Diet and nutrition Benefits of exercise Cancer screening  Vaccinations reviewed and recommendations Cardiovascular risk assessment and need for blood work Diagnosis of vasculitic rash to lower extremities and management with prednisone Advised to stop antibiotic Mental health including depression and anxiety Fall and accident prevention  Patient Instructions  Health Maintenance, Male Adopting a healthy lifestyle and getting preventive care are important in promoting health and wellness. Ask your health care provider about: The right schedule for you to have regular tests and exams. Things you can do on your own to prevent diseases and keep yourself healthy. What should I know about diet, weight, and exercise? Eat a healthy diet  Eat a diet that includes plenty of vegetables, fruits, low-fat dairy products, and lean protein. Do not eat a lot of foods that are high in solid fats, added sugars, or sodium. Maintain a healthy weight Body mass index (BMI) is a measurement that can be used to identify possible weight problems. It estimates body fat based on height and weight. Your health care provider can help determine your BMI and help you achieve or maintain a healthy weight. Get regular exercise Get regular exercise. This is one of the most important things you can do for your health. Most adults should: Exercise for at least 150 minutes each week. The exercise should increase your heart rate and make you sweat (moderate-intensity exercise). Do strengthening exercises at least twice a week. This is in addition to the moderate-intensity exercise. Spend less time  sitting. Even light physical activity can be beneficial. Watch cholesterol and blood lipids Have your blood tested for lipids and cholesterol at 82 years of age, then have this test every 5 years. You may need to have your cholesterol levels checked more often if: Your lipid or cholesterol levels are high. You are older than 82 years of age. You are at high risk for heart disease. What should I know about cancer screening? Many types of cancers can be detected early and may often be prevented. Depending on your health history and family history, you may need to have cancer screening at various ages. This may include screening for: Colorectal cancer. Prostate cancer. Skin cancer. Lung cancer. What should I know about heart disease, diabetes, and high blood pressure? Blood pressure and heart disease High blood pressure causes heart disease and increases the risk of stroke. This is more likely to develop in people who have high blood pressure readings or are overweight. Talk with your health care provider about your target blood pressure readings. Have your blood pressure checked: Every 3-5 years if you are 44-42 years of age. Every year if you are 64 years old or older. If you are between the ages of 19 and 51 and are a current or former smoker, ask your health care provider if you should have a one-time screening for abdominal aortic aneurysm (AAA). Diabetes Have regular diabetes screenings. This checks your fasting blood sugar level. Have the screening done: Once every three years after age 40 if you are at a normal weight and have a low risk for diabetes. More often and at a younger age if you are overweight or have a high risk for diabetes. What should I know about preventing infection? Hepatitis B If you have a higher risk for hepatitis  B, you should be screened for this virus. Talk with your health care provider to find out if you are at risk for hepatitis B infection. Hepatitis  C Blood testing is recommended for: Everyone born from [birth year] through 1966. Anyone with known risk factors for hepatitis C. Sexually transmitted infections (STIs) You should be screened each year for STIs, including gonorrhea and chlamydia, if: You are sexually active and are younger than 82 years of age. You are older than 82 years of age and your health care provider tells you that you are at risk for this type of infection. Your sexual activity has changed since you were last screened, and you are at increased risk for chlamydia or gonorrhea. Ask your health care provider if you are at risk. Ask your health care provider about whether you are at high risk for HIV. Your health care provider may recommend a prescription medicine to help prevent HIV infection. If you choose to take medicine to prevent HIV, you should first get tested for HIV. You should then be tested every 3 months for as long as you are taking the medicine. Follow these instructions at home: Alcohol use Do not drink alcohol if your health care provider tells you not to drink. If you drink alcohol: Limit how much you have to 0-2 drinks a day. Know how much alcohol is in your drink. In the [place] one drink equals one 12 oz bottle of beer (355 mL), one 5 oz glass of wine (148 mL), or one 1 oz glass of hard liquor (44 mL). Lifestyle Do not use any products that contain nicotine or tobacco. These products include cigarettes, chewing tobacco, and vaping devices, such as e-cigarettes. If you need help quitting, ask your health care provider. Do not use street drugs. Do not share needles. Ask your health care provider for help if you need support or information about quitting drugs. General instructions Schedule regular health, dental, and eye exams. Stay current with your vaccines. Tell your health care provider if: You often feel depressed. You have ever been abused or do not feel safe at home. Summary Adopting a healthy  lifestyle and getting preventive care are important in promoting health and wellness. Follow your health care provider's instructions about healthy diet, exercising, and getting tested or screened for diseases. Follow your health care provider's instructions on monitoring your cholesterol and blood pressure. This information is not intended to replace advice given to you by your health care provider. Make sure you discuss any questions you have with your health care provider. Document Revised: 07/18/2020 Document Reviewed: 07/18/2020 Elsevier Patient Education  2023 Elsevier Inc.     Edwina Barth, MD [facility]

## 2021-12-25 NOTE — Assessment & Plan Note (Signed)
Acute problem.  Of lower extremities. Most likely viral etiology. May benefit from prednisone 20 mg daily for 5 days.

## 2021-12-25 NOTE — Telephone Encounter (Addendum)
Patient states the prednisone was going to cost $400, wondering if there is something else that can be called in. Wondering if there is an oil that he can try.

## 2021-12-25 NOTE — Patient Instructions (Signed)
Health Maintenance, Male Adopting a healthy lifestyle and getting preventive care are important in promoting health and wellness. Ask your health care provider about: The right schedule for you to have regular tests and exams. Things you can do on your own to prevent diseases and keep yourself healthy. What should I know about diet, weight, and exercise? Eat a healthy diet  Eat a diet that includes plenty of vegetables, fruits, low-fat dairy products, and lean protein. Do not eat a lot of foods that are high in solid fats, added sugars, or sodium. Maintain a healthy weight Body mass index (BMI) is a measurement that can be used to identify possible weight problems. It estimates body fat based on height and weight. Your health care provider can help determine your BMI and help you achieve or maintain a healthy weight. Get regular exercise Get regular exercise. This is one of the most important things you can do for your health. Most adults should: Exercise for at least 150 minutes each week. The exercise should increase your heart rate and make you sweat (moderate-intensity exercise). Do strengthening exercises at least twice a week. This is in addition to the moderate-intensity exercise. Spend less time sitting. Even light physical activity can be beneficial. Watch cholesterol and blood lipids Have your blood tested for lipids and cholesterol at 82 years of age, then have this test every 5 years. You may need to have your cholesterol levels checked more often if: Your lipid or cholesterol levels are high. You are older than 82 years of age. You are at high risk for heart disease. What should I know about cancer screening? Many types of cancers can be detected early and may often be prevented. Depending on your health history and family history, you may need to have cancer screening at various ages. This may include screening for: Colorectal cancer. Prostate cancer. Skin cancer. Lung  cancer. What should I know about heart disease, diabetes, and high blood pressure? Blood pressure and heart disease High blood pressure causes heart disease and increases the risk of stroke. This is more likely to develop in people who have high blood pressure readings or are overweight. Talk with your health care provider about your target blood pressure readings. Have your blood pressure checked: Every 3-5 years if you are 18-39 years of age. Every year if you are 40 years old or older. If you are between the ages of 65 and 75 and are a current or former smoker, ask your health care provider if you should have a one-time screening for abdominal aortic aneurysm (AAA). Diabetes Have regular diabetes screenings. This checks your fasting blood sugar level. Have the screening done: Once every three years after age 45 if you are at a normal weight and have a low risk for diabetes. More often and at a younger age if you are overweight or have a high risk for diabetes. What should I know about preventing infection? Hepatitis B If you have a higher risk for hepatitis B, you should be screened for this virus. Talk with your health care provider to find out if you are at risk for hepatitis B infection. Hepatitis C Blood testing is recommended for: Everyone born from 1945 through 1965. Anyone with known risk factors for hepatitis C. Sexually transmitted infections (STIs) You should be screened each year for STIs, including gonorrhea and chlamydia, if: You are sexually active and are younger than 82 years of age. You are older than 82 years of age and your   health care provider tells you that you are at risk for this type of infection. Your sexual activity has changed since you were last screened, and you are at increased risk for chlamydia or gonorrhea. Ask your health care provider if you are at risk. Ask your health care provider about whether you are at high risk for HIV. Your health care provider  may recommend a prescription medicine to help prevent HIV infection. If you choose to take medicine to prevent HIV, you should first get tested for HIV. You should then be tested every 3 months for as long as you are taking the medicine. Follow these instructions at home: Alcohol use Do not drink alcohol if your health care provider tells you not to drink. If you drink alcohol: Limit how much you have to 0-2 drinks a day. Know how much alcohol is in your drink. In the [place] one drink equals one 12 oz bottle of beer (355 mL), one 5 oz glass of wine (148 mL), or one 1 oz glass of hard liquor (44 mL). Lifestyle Do not use any products that contain nicotine or tobacco. These products include cigarettes, chewing tobacco, and vaping devices, such as e-cigarettes. If you need help quitting, ask your health care provider. Do not use street drugs. Do not share needles. Ask your health care provider for help if you need support or information about quitting drugs. General instructions Schedule regular health, dental, and eye exams. Stay current with your vaccines. Tell your health care provider if: You often feel depressed. You have ever been abused or do not feel safe at home. Summary Adopting a healthy lifestyle and getting preventive care are important in promoting health and wellness. Follow your health care provider's instructions about healthy diet, exercising, and getting tested or screened for diseases. Follow your health care provider's instructions on monitoring your cholesterol and blood pressure. This information is not intended to replace advice given to you by your health care provider. Make sure you discuss any questions you have with your health care provider. Document Revised: 07/18/2020 Document Reviewed: 07/18/2020 Elsevier Patient Education  2023 Elsevier Inc.  

## 2021-12-26 NOTE — Telephone Encounter (Signed)
What pharmacy did he go to?  Prednisone is not an expensive medication, certainly not nearly $400.

## 2021-12-27 NOTE — Telephone Encounter (Signed)
Patient medication was dispensed to him with no charge.

## 2021-12-27 NOTE — Telephone Encounter (Signed)
Thank you :)

## 2022-01-02 ENCOUNTER — Encounter: Payer: Self-pay | Admitting: Emergency Medicine

## 2022-01-02 ENCOUNTER — Ambulatory Visit (INDEPENDENT_AMBULATORY_CARE_PROVIDER_SITE_OTHER): Payer: Medicare Other | Admitting: Emergency Medicine

## 2022-01-02 VITALS — BP 118/68 | HR 82 | Temp 98.3°F | Ht 70.0 in | Wt 187.1 lb

## 2022-01-02 DIAGNOSIS — L299 Pruritus, unspecified: Secondary | ICD-10-CM | POA: Diagnosis not present

## 2022-01-02 DIAGNOSIS — I776 Arteritis, unspecified: Secondary | ICD-10-CM | POA: Diagnosis not present

## 2022-01-02 MED ORDER — TRIAMCINOLONE ACETONIDE 0.1 % EX CREA: 1.0000 | TOPICAL_CREAM | Freq: Two times a day (BID) | CUTANEOUS | 0 refills | Status: DC

## 2022-01-02 MED ORDER — PREDNISONE 20 MG PO TABS: 20.0000 mg | ORAL_TABLET | Freq: Every day | ORAL | 0 refills | Status: AC

## 2022-01-02 NOTE — Patient Instructions (Signed)
Rash, Adult  A rash is a change in the color of your skin. A rash can also change the way your skin feels. There are many different conditions and factors that can cause a rash. Follow these instructions at home: The goal of treatment is to stop the itching and keep the rash from spreading. Watch for any changes in your symptoms. Let your doctor know about them. Follow these instructions to help with your condition: Medicine Take or apply over-the-counter and prescription medicines only as told by your doctor. These may include medicines: To treat red or swollen skin (corticosteroid creams). To treat itching. To treat an allergy (oral antihistamines). To treat very bad symptoms (oral corticosteroids).  Skin care Put cool cloths (compresses) on the affected areas. Do not scratch or rub your skin. Avoid covering the rash. Make sure that the rash is exposed to air as much as possible. Managing itching and discomfort Avoid hot showers or baths. These can make itching worse. A cold shower may help. Try taking a bath with: Epsom salts. You can get these at your local pharmacy or grocery store. Follow the instructions on the package. Baking soda. Pour a small amount into the bath as told by your doctor. Colloidal oatmeal. You can get this at your local pharmacy or grocery store. Follow the instructions on the package. Try putting baking soda paste onto your skin. Stir water into baking soda until it gets like a paste. Try putting on a lotion that relieves itchiness (calamine lotion). Keep cool and out of the sun. Sweating and being hot can make itching worse. General instructions  Rest as needed. Drink enough fluid to keep your pee (urine) pale yellow. Wear loose-fitting clothing. Avoid scented soaps, detergents, and perfumes. Use gentle soaps, detergents, perfumes, and other cosmetic products. Avoid anything that causes your rash. Keep a journal to help track what causes your rash. Write  down: What you eat. What cosmetic products you use. What you drink. What you wear. This includes jewelry. Keep all follow-up visits as told by your doctor. This is important. Contact a doctor if: You sweat at night. You lose weight. You pee (urinate) more than normal. You pee less than normal, or you notice that your pee is a darker color than normal. You feel weak. You throw up (vomit). Your skin or the whites of your eyes look yellow (jaundice). Your skin: Tingles. Is numb. Your rash: Does not go away after a few days. Gets worse. You are: More thirsty than normal. More tired than normal. You have: New symptoms. Pain in your belly (abdomen). A fever. Watery poop (diarrhea). Get help right away if: You have a fever and your symptoms suddenly get worse. You start to feel mixed up (confused). You have a very bad headache or a stiff neck. You have very bad joint pains or stiffness. You have jerky movements that you cannot control (seizure). Your rash covers all or most of your body. The rash may or may not be painful. You have blisters that: Are on top of the rash. Grow larger. Grow together. Are painful. Are inside your nose or mouth. You have a rash that: Looks like purple pinprick-sized spots all over your body. Has a "bull's eye" or looks like a target. Is red and painful, causes your skin to peel, and is not from being in the sun too long. Summary A rash is a change in the color of your skin. A rash can also change the way your skin   feels. The goal of treatment is to stop the itching and keep the rash from spreading. Take or apply over-the-counter and prescription medicines only as told by your doctor. Contact a doctor if you have new symptoms or symptoms that get worse. Keep all follow-up visits as told by your doctor. This is important. This information is not intended to replace advice given to you by your health care provider. Make sure you discuss any  questions you have with your health care provider. Document Revised: 12/08/2020 Document Reviewed: 12/08/2020 Elsevier Patient Education  2023 Elsevier Inc.  

## 2022-01-02 NOTE — Assessment & Plan Note (Signed)
Triamcinolone cream twice a day for 5 days

## 2022-01-02 NOTE — Progress Notes (Signed)
Levi Phillips 81 y.o.   Chief Complaint  Patient presents with   Follow-up    F/u , patient states medication is not working .     HISTORY OF PRESENT ILLNESS: This is a 81 y.o. male seen by me on 12/25/2021 for general exam.  Also found to have vasculitic rash to lower extremities.  Started on prednisone 20 mg daily for 5 days which helped but still has some itching and some residual rash.  No new complaints or any other associated symptoms.  No other complaints or medical concerns today.  HPI   Prior to Admission medications   Medication Sig Start Date End Date Taking? Authorizing Provider  allopurinol (ZYLOPRIM) 100 MG tablet Take 1 tablet (100 mg total) by mouth daily. 12/25/21  Yes Devean Skoczylas, Ines Bloomer, MD  ibuprofen (ADVIL) 600 MG tablet Take 1 tablet (600 mg total) by mouth every 6 (six) hours as needed for fever, mild pain or moderate pain. 4/78/29  Yes Campbell Stall P, DO  tamsulosin (FLOMAX) 0.4 MG CAPS capsule Take 1 capsule (0.4 mg total) by mouth daily. 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   History of vertigo 05/09/2017   History of kidney stones 04/10/2017   DDD (degenerative disc disease), lumbar 01/09/2017   Gout 01/09/2017   BPH (benign prostatic hyperplasia) 01/09/2017   BMI 27.0-27.9,adult 01/09/2017    Past Medical History:  Diagnosis Date   Allergy    Anemia    Arthritis    Back pain    spondylolisthesis   Enlarged prostate    takes Flomax daily   Gout    takes Allopurinol daily    Past Surgical History:  Procedure Laterality Date   cataract surgery Bilateral    EYE SURGERY     LITHOTRIPSY     done in [place] History   Socioeconomic History    Marital status: Married    Spouse name: Not on file   Number of children: Not on file   Years of education: Not on file   Highest education level: Not on file  Occupational History   Not on file  Tobacco Use   Smoking status: Never   Smokeless tobacco: Never  Vaping Use   Vaping Use: Never used  Substance and Sexual Activity   Alcohol use: Yes    Comment: occ beer   Drug use: No   Sexual activity: Not on file  Other Topics Concern   Not on file  Social History Narrative   ** Merged History Encounter **       Social Determinants of Health   Financial Resource Strain: Not on file  Food Insecurity: Not on file  Transportation Needs: Not on file  Physical Activity: Not on file  Stress: Not on file  Social Connections: Not on file  Intimate Partner Violence: Not on file    Family History  Problem Relation Age of Onset   Cancer Mother    Alzheimer's disease Sister    Alzheimer's disease Sister      Review of Systems  Constitutional: Negative.  Negative for chills and fever.  HENT: Negative.  Negative for congestion and sore throat.   Respiratory: Negative.  Negative for cough and shortness of breath.   Cardiovascular: Negative.  Negative for chest pain and palpitations.  Gastrointestinal:  Negative for nausea and vomiting.  Genitourinary: Negative.   Skin:  Positive for itching and rash.  All other systems reviewed and are negative.  Today's Vitals   01/02/22 1456  BP: 118/68  Pulse: 82  Temp: 98.3 F (36.8 C)  TempSrc: Oral  SpO2: 94%  Weight: 187 lb 2 oz (84.9 kg)  Height: 5\' 10"  (1.778 m)   Body mass index is 26.85 kg/m.   Physical Exam Vitals reviewed.  Constitutional:      Appearance: Normal appearance.  HENT:     Head: Normocephalic.  Eyes:     Extraocular Movements: Extraocular movements intact.     Pupils: Pupils are equal, round, and reactive to light.  Cardiovascular:     Rate and Rhythm: Normal rate.  Pulmonary:     Effort:  Pulmonary effort is normal.  Skin:    General: Skin is warm and dry.     Findings: Rash present.     Comments: Lower extremities.  Much improved rash but still some left and ankle and feet areas  Neurological:     General: No focal deficit present.     Mental Status: He is alert and oriented to person, place, and time.  Psychiatric:        Mood and Affect: Mood normal.        Behavior: Behavior normal.      ASSESSMENT & PLAN: A total of 42 minutes was spent with the patient and counseling/coordination of care regarding preparing for this visit, review of most recent office visit notes, review of most recent blood work results showing elevated sedimentation rate, diagnosis of vasculitis and treatment including oral and topical corticosteroids, differential diagnosis, prognosis, documentation, and need for follow-up.  Problem List Items Addressed This Visit       Cardiovascular and Mediastinum   Vasculitis ([place]) - Primary    Transient vasculitis slowly getting better. Elevated sed rate of 30 We will continue 20 mg of prednisone daily for 5 more days and use triamcinolone cream as needed      Relevant Medications   predniSONE (DELTASONE) 20 MG tablet   triamcinolone cream (KENALOG) 0.1 %     Musculoskeletal and Integument   Itchy skin    Triamcinolone cream twice a day for 5 days      Relevant Medications   predniSONE (DELTASONE) 20 MG tablet   triamcinolone cream (KENALOG) 0.1 %   Patient Instructions  Rash, Adult  A rash is a change in the color of your skin. A rash can also change the way your skin feels. There are many different conditions and factors that can cause a rash. Follow these instructions at home: The goal of treatment is to stop the itching and keep the rash from spreading. Watch for any changes in your symptoms. Let your doctor know about them. Follow these instructions to help with your condition: Medicine Take or apply over-the-counter and prescription  medicines only as told by your doctor. These may include medicines: To treat red or swollen skin (corticosteroid creams). To treat itching. To treat an allergy (oral antihistamines). To treat very bad symptoms (oral corticosteroids).  Skin care Put cool cloths (compresses) on the affected areas. Do not scratch or rub your skin. Avoid covering the rash. Make sure that the rash is exposed to air as much as possible. Managing itching and discomfort Avoid hot showers  or baths. These can make itching worse. A cold shower may help. Try taking a bath with: Epsom salts. You can get these at your local pharmacy or grocery store. Follow the instructions on the package. Baking soda. Pour a small amount into the bath as told by your doctor. Colloidal oatmeal. You can get this at your local pharmacy or grocery store. Follow the instructions on the package. Try putting baking soda paste onto your skin. Stir water into baking soda until it gets like a paste. Try putting on a lotion that relieves itchiness (calamine lotion). Keep cool and out of the sun. Sweating and being hot can make itching worse. General instructions  Rest as needed. Drink enough fluid to keep your pee (urine) pale yellow. Wear loose-fitting clothing. Avoid scented soaps, detergents, and perfumes. Use gentle soaps, detergents, perfumes, and other cosmetic products. Avoid anything that causes your rash. Keep a journal to help track what causes your rash. Write down: What you eat. What cosmetic products you use. What you drink. What you wear. This includes jewelry. Keep all follow-up visits as told by your doctor. This is important. Contact a doctor if: You sweat at night. You lose weight. You pee (urinate) more than normal. You pee less than normal, or you notice that your pee is a darker color than normal. You feel weak. You throw up (vomit). Your skin or the whites of your eyes look yellow (jaundice). Your  skin: Tingles. Is numb. Your rash: Does not go away after a few days. Gets worse. You are: More thirsty than normal. More tired than normal. You have: New symptoms. Pain in your belly (abdomen). A fever. Watery poop (diarrhea). Get help right away if: You have a fever and your symptoms suddenly get worse. You start to feel mixed up (confused). You have a very bad headache or a stiff neck. You have very bad joint pains or stiffness. You have jerky movements that you cannot control (seizure). Your rash covers all or most of your body. The rash may or may not be painful. You have blisters that: Are on top of the rash. Grow larger. Grow together. Are painful. Are inside your nose or mouth. You have a rash that: Looks like purple pinprick-sized spots all over your body. Has a "bull's eye" or looks like a target. Is red and painful, causes your skin to peel, and is not from being in the sun too long. Summary A rash is a change in the color of your skin. A rash can also change the way your skin feels. The goal of treatment is to stop the itching and keep the rash from spreading. Take or apply over-the-counter and prescription medicines only as told by your doctor. Contact a doctor if you have new symptoms or symptoms that get worse. Keep all follow-up visits as told by your doctor. This is important. This information is not intended to replace advice given to you by your health care provider. Make sure you discuss any questions you have with your health care provider. Document Revised: 12/08/2020 Document Reviewed: 12/08/2020 Elsevier Patient Education  [place], MD [facility]

## 2022-01-02 NOTE — Assessment & Plan Note (Signed)
Transient vasculitis slowly getting better. Elevated sed rate of 30 We will continue 20 mg of prednisone daily for 5 more days and use triamcinolone cream as needed

## 2022-01-10 ENCOUNTER — Telehealth: Payer: Self-pay | Admitting: Emergency Medicine

## 2022-01-10 NOTE — Telephone Encounter (Signed)
Pt states that his rash has gotten a little bit better but has not gone away yet. He is out of the medications and would like a refill please.   Caller & Relationship to patient: Self  Call back number: [phone]  Date of last office visit: 10.24.23  Date of next office visit: 10.24.24  Medication(s) to be refilled:  triamcinolone cream (KENALOG) 0.1 % predniSONE (DELTASONE) 20 MG tablet  Preferred Pharmacy:   [facility]  Phone:  [phone]  Fax:  [phone]

## 2022-01-10 NOTE — Telephone Encounter (Signed)
error 

## 2022-01-11 ENCOUNTER — Telehealth: Payer: Self-pay | Admitting: Emergency Medicine

## 2022-01-11 ENCOUNTER — Other Ambulatory Visit: Payer: Self-pay | Admitting: *Deleted

## 2022-01-11 DIAGNOSIS — I776 Arteritis, unspecified: Secondary | ICD-10-CM

## 2022-01-11 DIAGNOSIS — L299 Pruritus, unspecified: Secondary | ICD-10-CM

## 2022-01-11 DIAGNOSIS — Z8739 Personal history of other diseases of the musculoskeletal system and connective tissue: Secondary | ICD-10-CM

## 2022-01-11 MED ORDER — ALLOPURINOL 100 MG PO TABS: 100.0000 mg | ORAL_TABLET | Freq: Every day | ORAL | 3 refills | Status: AC

## 2022-01-11 MED ORDER — TRIAMCINOLONE ACETONIDE 0.1 % EX CREA: 1.0000 | TOPICAL_CREAM | Freq: Two times a day (BID) | CUTANEOUS | 0 refills | Status: DC

## 2022-01-11 NOTE — Telephone Encounter (Signed)
Called patient to inform him that those medications was resent to his pharmacy

## 2022-01-11 NOTE — Telephone Encounter (Signed)
Patient called and stated that the two medications that Dr. Mitchel Honour prescribed him were not at the [facility]. He wants the two medications to be resent to his [place], [place]  . The two medications are allopurinol (ZYLOPRIM) 100 MG tablet [[id number]]  & triamcinolone cream (KENALOG) 0.1 % [[id number]].  Patient also stated if he could called once the medications are resent.

## 2022-03-01 ENCOUNTER — Ambulatory Visit: Payer: Medicare Other | Admitting: Emergency Medicine

## 2022-03-01 ENCOUNTER — Telehealth: Payer: Self-pay | Admitting: Emergency Medicine

## 2022-03-01 DIAGNOSIS — L299 Pruritus, unspecified: Secondary | ICD-10-CM

## 2022-03-01 DIAGNOSIS — I776 Arteritis, unspecified: Secondary | ICD-10-CM

## 2022-03-01 NOTE — Telephone Encounter (Signed)
MEDICATION:triamcinolone cream (KENALOG) 0.1 %   PHARMACY: [facility] - [place], [place] - [address]    Comments:   **Let patient know to contact pharmacy at the end of the day to make sure medication is ready. **  ** Please notify patient to allow 48-72 hours to process**  **Encourage patient to contact the pharmacy for refills or they can request refills through [facility]**

## 2022-03-01 NOTE — Telephone Encounter (Signed)
Patient asked for a call back if it is sent in

## 2022-03-02 MED ORDER — TRIAMCINOLONE ACETONIDE 0.1 % EX CREA: 1.0000 | TOPICAL_CREAM | Freq: Two times a day (BID) | CUTANEOUS | 1 refills | Status: DC

## 2022-03-02 NOTE — Telephone Encounter (Signed)
Called patient to inform him that his medication has been sent to his pharmacy

## 2022-03-28 ENCOUNTER — Ambulatory Visit: Payer: Self-pay

## 2022-03-28 ENCOUNTER — Ambulatory Visit (INDEPENDENT_AMBULATORY_CARE_PROVIDER_SITE_OTHER): Payer: 59 | Admitting: Orthopedic Surgery

## 2022-03-28 VITALS — BP 138/84 | HR 69 | Ht 70.0 in | Wt 188.0 lb

## 2022-03-28 DIAGNOSIS — M4316 Spondylolisthesis, lumbar region: Secondary | ICD-10-CM

## 2022-03-28 DIAGNOSIS — M5126 Other intervertebral disc displacement, lumbar region: Secondary | ICD-10-CM

## 2022-03-28 NOTE — Progress Notes (Signed)
Orthopedic Spine Surgery Office Note  Assessment: Patient is a 82 y.o. male with low back pain that radiates into bilateral buttocks. Improves with walking so does not seem consistent with neurogenic claudication   Plan: -Explained that initially conservative treatment is tried as a significant number of patients may experience relief with these treatment modalities. Discussed that the conservative treatments include:  -activity modification  -physical therapy  -over the counter pain medications  -medrol dosepak  -lumbar steroid injections -Patient has tried activity modification and home exercise program.  Is on ibuprofen for gout -Recommended he continue with the ibuprofen.  Provided him with core strengthening exercises that he should do at least 4 times per week.  I also told him he can take Tylenol 1000 mg 3 times a day as needed for pain relief -Patient should return to office in 26 weeks, x-rays at next visit: Lumbar AP/lateral/flex/ex   Patient expressed understanding of the plan and all questions were answered to the patient's satisfaction.   ___________________________________________________________________________   History:  Patient is a 82 y.o. male who presents today for lumbar spine.  Patient has had several years of low back pain but it often gets better with time.  He comes in today because he had 2 to 3 months of low back pain that radiates into the bilateral buttocks.  It is worse on the left buttock.  It does not radiate past the buttocks.  Does not have any numbness or paresthesias.  There is no trauma or injury recently that preceded his pain.   Weakness: Denies Symptoms of imbalance: Denies Paresthesias and numbness: Denies Bowel or bladder incontinence: Denies Saddle anesthesia: Denies  Treatments tried: activity modification, home exercises, ibuprofen  Review of systems: Denies fevers and chills, night sweats, unexplained weight loss, history of cancer,  pain that wakes them at night  Past medical history: Osteoarthritis Gout BPH  Allergies: NKDA  Past surgical history:  Cataract surgery Lithotripsy  Social history: Denies use of nicotine product (smoking, vaping, patches, smokeless) Alcohol use: rare Denies recreational drug use  Physical Exam:  General: no acute distress, appears stated age Neurologic: alert, answering questions appropriately, following commands Respiratory: unlabored breathing on room air, symmetric chest rise Psychiatric: appropriate affect, normal cadence to speech   MSK (spine):  -Strength exam      Left  Right EHL    5/5  5/5 TA    5/5  5/5 GSC    5/5  5/5 Knee extension  5/5  5/5 Hip flexion   5/5  5/5  -Sensory exam    Sensation intact to light touch in L3-S1 nerve distributions of bilateral lower extremities  -Achilles DTR: 1/4 on the left, 1/4 on the right -Patellar tendon DTR: 1/4 on the left, 0/4 on the right  -Straight leg raise: negative -Contralateral straight leg raise: negative -Clonus: no beats bilaterally  -Left hip exam: no pain through range of motion, negative stinchfield, negative FABER -Right hip exam: no pain through range of motion, negative stinchfield, negative FABER  Imaging: XR of the lumbar spine from 03/28/2022 was independently reviewed and interpreted, 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 Disc height loss at multiple levels. PI of 56, LL of 23. Lumbar scoliosis measures 12 degrees with apex at L2 to the right. No fracture seen.   Patient name: Levi Phillips Patient MRN: [id number] Date of visit: 03/28/22

## 2022-04-16 ENCOUNTER — Telehealth: Payer: Self-pay | Admitting: Emergency Medicine

## 2022-04-16 DIAGNOSIS — I776 Arteritis, unspecified: Secondary | ICD-10-CM

## 2022-04-16 DIAGNOSIS — L299 Pruritus, unspecified: Secondary | ICD-10-CM

## 2022-04-16 MED ORDER — TRIAMCINOLONE ACETONIDE 0.1 % EX CREA: 1.0000 | TOPICAL_CREAM | Freq: Two times a day (BID) | CUTANEOUS | 1 refills | Status: DC

## 2022-04-16 NOTE — Telephone Encounter (Signed)
New medication sent to patient pharmacy  

## 2022-04-16 NOTE — Telephone Encounter (Signed)
MEDICATION:triamcinolone cream (KENALOG) 0.1 %   [place], [place].   Comments: Patient is still having issues with leg and would like medication refilled  **Let patient know to contact pharmacy at the end of the day to make sure medication is ready. **  ** Please notify patient to allow 48-72 hours to process**  **Encourage patient to contact the pharmacy for refills or they can request refills through [facility]**

## 2022-05-02 ENCOUNTER — Ambulatory Visit (INDEPENDENT_AMBULATORY_CARE_PROVIDER_SITE_OTHER): Payer: 59 | Admitting: Emergency Medicine

## 2022-05-02 ENCOUNTER — Encounter: Payer: Self-pay | Admitting: Emergency Medicine

## 2022-05-02 VITALS — BP 130/82 | HR 66 | Temp 98.4°F | Ht 70.0 in | Wt 188.2 lb

## 2022-05-02 DIAGNOSIS — Z87898 Personal history of other specified conditions: Secondary | ICD-10-CM

## 2022-05-02 DIAGNOSIS — L299 Pruritus, unspecified: Secondary | ICD-10-CM | POA: Diagnosis not present

## 2022-05-02 DIAGNOSIS — K5904 Chronic idiopathic constipation: Secondary | ICD-10-CM | POA: Diagnosis not present

## 2022-05-02 DIAGNOSIS — N400 Enlarged prostate without lower urinary tract symptoms: Secondary | ICD-10-CM

## 2022-05-02 DIAGNOSIS — R42 Dizziness and giddiness: Secondary | ICD-10-CM | POA: Diagnosis not present

## 2022-05-02 LAB — COMPREHENSIVE METABOLIC PANEL
ALT: 16 U/L (ref 0–53)
AST: 17 U/L (ref 0–37)
Albumin: 4.4 g/dL (ref 3.5–5.2)
Alkaline Phosphatase: 56 U/L (ref 39–117)
BUN: 17 mg/dL (ref 6–23)
CO2: 28 mEq/L (ref 19–32)
Calcium: 9.7 mg/dL (ref 8.4–10.5)
Chloride: 101 mEq/L (ref 96–112)
Creatinine, Ser: 1 mg/dL (ref 0.40–1.50)
GFR: 70.23 mL/min (ref >60.00)
Glucose, Bld: 86 mg/dL (ref 70–99)
Potassium: 4.2 mEq/L (ref 3.5–5.1)
Sodium: 137 mEq/L (ref 135–145)
Total Bilirubin: 0.6 mg/dL (ref 0.2–1.2)
Total Protein: 7.5 g/dL (ref 6.0–8.3)

## 2022-05-02 LAB — CBC WITH DIFFERENTIAL/PLATELET
Basophils Absolute: 0 10*3/uL (ref 0.0–0.1)
Basophils Relative: 0.4 % (ref 0.0–3.0)
Eosinophils Absolute: 0.5 10*3/uL (ref 0.0–0.7)
Eosinophils Relative: 6.6 % — ABNORMAL HIGH (ref 0.0–5.0)
HCT: 42.5 % (ref 39.0–52.0)
Hemoglobin: 14.3 g/dL (ref 13.0–17.0)
Lymphocytes Relative: 24.5 % (ref 12.0–46.0)
Lymphs Abs: 2 10*3/uL (ref 0.7–4.0)
MCHC: 33.7 g/dL (ref 30.0–36.0)
MCV: 87.5 fl (ref 78.0–100.0)
Monocytes Absolute: 0.8 10*3/uL (ref 0.1–1.0)
Monocytes Relative: 10.5 % (ref 3.0–12.0)
Neutro Abs: 4.7 10*3/uL (ref 1.4–7.7)
Neutrophils Relative %: 58 % (ref 43.0–77.0)
Platelets: 190 10*3/uL (ref 150.0–400.0)
RBC: 4.85 Mil/uL (ref 4.22–5.81)
RDW: 13.9 % (ref 11.5–15.5)
WBC: 8 10*3/uL (ref 4.0–10.5)

## 2022-05-02 MED ORDER — TRULANCE 3 MG PO TABS: 3.0000 mg | ORAL_TABLET | Freq: Every day | ORAL | 3 refills | Status: DC

## 2022-05-02 NOTE — Assessment & Plan Note (Signed)
Stable chronic condition. Continue tamsulosin 0.4 mg daily.

## 2022-05-02 NOTE — Assessment & Plan Note (Signed)
Unresponsive to over-the-counter medications Diet and nutrition discussed Advised to stay well-hydrated and can increase amount of daily fiber intake Recommend to start Trulance 3 mg daily.

## 2022-05-02 NOTE — Patient Instructions (Signed)

## 2022-05-02 NOTE — Assessment & Plan Note (Signed)
Presently asymptomatic.  Clinically stable. No red flag signs or symptoms. Episodes do not last long.  Has history of vertigo in the past. Normal neurological examination with normal vital signs today. Needs blood work. ED precautions given. Advised to contact the office if no better or worse during the next several days.

## 2022-05-02 NOTE — Progress Notes (Signed)
Levi Phillips 82 y.o.   Chief Complaint  Patient presents with   Acute Visit    Patient experiencing some dizziness patient states it started about 20 minutes ago.   Digestive issues and constipation    HISTORY OF PRESENT ILLNESS: This is a 82 y.o. male complaining of chronic constipation for several months. Also complaining of generalized itching. Had episode of dizziness about 20 minutes ago.  Now better. No other associated symptoms Denies abdominal pain, nausea or vomiting.  Denies rectal bleeding. Previous rash to the lower extremities diagnosis vasculitis is gone. No other complaints or medical concerns today.  HPI   Prior to Admission medications   Medication Sig Start Date End Date Taking? Authorizing Provider  allopurinol (ZYLOPRIM) 100 MG tablet Take 1 tablet (100 mg total) by mouth daily. 01/11/22  Yes Meliya Mcconahy, Ines Bloomer, MD  ibuprofen (ADVIL) 600 MG tablet Take 1 tablet (600 mg total) by mouth every 6 (six) hours as needed for fever, mild pain or moderate pain. [id number]  Yes Campbell Stall P, DO  tamsulosin (FLOMAX) 0.4 MG CAPS capsule Take 1 capsule (0.4 mg total) by mouth daily. 07/05/21  Yes Caydence Enck, Ines Bloomer, MD  triamcinolone cream (KENALOG) 0.1 % Apply 1 Application topically 2 (two) times daily. 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   History of vertigo 05/09/2017   History of kidney stones 04/10/2017   DDD (degenerative disc disease), lumbar 01/09/2017   Gout 01/09/2017   BPH (benign prostatic hyperplasia) 01/09/2017   BMI 27.0-27.9,adult 01/09/2017    Past Medical History:  Diagnosis Date   Allergy    Anemia    Arthritis    Back pain     spondylolisthesis   Enlarged prostate    takes Flomax daily   Gout    takes Allopurinol daily    Past Surgical History:  Procedure Laterality Date   cataract surgery Bilateral    EYE SURGERY     LITHOTRIPSY     done in [place] History   Socioeconomic History   Marital status: Married    Spouse name: Not on file   Number of children: Not on file   Years of education: Not on file   Highest education level: Not on file  Occupational History   Not on file  Tobacco Use   Smoking status: Never   Smokeless tobacco: Never  Vaping Use   Vaping Use: Never used  Substance and Sexual Activity   Alcohol use: Yes    Comment: occ beer   Drug use: No   Sexual activity: Not on file  Other Topics Concern   Not on file  Social History Narrative   ** Merged History Encounter **       Social Determinants of Health   Financial Resource Strain: Not on file  Food Insecurity: Not on file  Transportation Needs: Not on file  Physical Activity: Not on file  Stress: Not on file  Social Connections: Not on file  Intimate Partner Violence: Not on file    Family History  Problem Relation Age of Onset   Cancer Mother    Alzheimer's disease Sister    Alzheimer's  disease Sister      Review of Systems  Constitutional: Negative.  Negative for chills and fever.  HENT: Negative.  Negative for congestion and sore throat.   Respiratory: Negative.  Negative for cough and shortness of breath.   Cardiovascular: Negative.  Negative for chest pain and palpitations.  Gastrointestinal:  Positive for constipation.  Genitourinary: Negative.  Negative for dysuria and hematuria.  Skin:  Positive for itching. Negative for rash.  Neurological:  Positive for dizziness.  All other systems reviewed and are negative.   Today's Vitals   05/02/22 0901  BP: 130/82  Pulse: 66  Temp: 98.4 F (36.9 C)  TempSrc: Oral  SpO2: 97%  Weight: 188 lb 4 oz (85.4 kg)  Height: 5' 10"$  (1.778 m)    Body mass index is 27.01 kg/m.  Physical Exam Vitals reviewed.  Constitutional:      Appearance: Normal appearance.  HENT:     Head: Normocephalic.     Mouth/Throat:     Mouth: Mucous membranes are moist.     Pharynx: Oropharynx is clear.  Eyes:     Extraocular Movements: Extraocular movements intact.     Conjunctiva/sclera: Conjunctivae normal.     Pupils: Pupils are equal, round, and reactive to light.  Cardiovascular:     Rate and Rhythm: Normal rate and regular rhythm.     Pulses: Normal pulses.     Heart sounds: Normal heart sounds.  Pulmonary:     Effort: Pulmonary effort is normal.     Breath sounds: Normal breath sounds.  Abdominal:     Palpations: Abdomen is soft.     Tenderness: There is no abdominal tenderness. There is no guarding.  Musculoskeletal:     Cervical back: No tenderness.  Lymphadenopathy:     Cervical: No cervical adenopathy.  Skin:    General: Skin is warm and dry.  Neurological:     General: No focal deficit present.     Mental Status: He is alert and oriented to person, place, and time.  Psychiatric:        Mood and Affect: Mood normal.        Behavior: Behavior normal.      ASSESSMENT & PLAN: A total of 42 minutes was spent with the patient and counseling/coordination of care regarding preparing for this visit, review of most recent office visit notes, review of multiple chronic medical conditions and their management, review of all medications, differential diagnosis of dizzy spells and need for blood work today, ED precautions, treatment of chronic constipation, education on nutrition, prognosis, documentation, and need for follow-up.  Problem List Items Addressed This Visit       Digestive   Chronic idiopathic constipation - Primary    Unresponsive to over-the-counter medications Diet and nutrition discussed Advised to stay well-hydrated and can increase amount of daily fiber intake Recommend to start Trulance 3 mg daily.       Relevant Medications   Plecanatide (TRULANCE) 3 MG TABS     Genitourinary   BPH (benign prostatic hyperplasia)    Stable chronic condition. Continue tamsulosin 0.4 mg daily.        Other   History of vertigo   Itching   Dizzy spells    Presently asymptomatic.  Clinically stable. No red flag signs or symptoms. Episodes do not last long.  Has history of vertigo in the past. Normal neurological examination with normal vital signs today. Needs blood work. ED precautions given. Advised to contact the office if no  better or worse during the next several days.      Relevant Orders   CBC with Differential/Platelet   Comprehensive metabolic panel   Patient Instructions  Constipation, Adult Constipation is when a person has trouble pooping (having a bowel movement). When you have this condition, you may poop fewer than 3 times a week. Your poop (stool) may also be dry, hard, or bigger than normal. Follow these instructions at home: Eating and drinking  Eat foods that have a lot of fiber, such as: Fresh fruits and vegetables. Whole grains. Beans. Eat less of foods that are low in fiber and high in fat and sugar, such as: [place] fries. Hamburgers. Cookies. Candy. Soda. Drink enough fluid to keep your pee (urine) pale yellow. General instructions Exercise regularly or as told by your doctor. Try to do 150 minutes of exercise each week. Go to the restroom when you feel like you need to poop. Do not hold it in. Take over-the-counter and prescription medicines only as told by your doctor. These include any fiber supplements. When you poop: Do deep breathing while relaxing your lower belly (abdomen). Relax your pelvic floor. The pelvic floor is a group of muscles that support the rectum, bladder, and intestines (as well as the uterus in women). Watch your condition for any changes. Tell your doctor if you notice any. Keep all follow-up visits as told by your doctor. This is  important. Contact a doctor if: You have pain that gets worse. You have a fever. You have not pooped for 4 days. You vomit. You are not hungry. You lose weight. You are bleeding from the opening of the butt (anus). You have thin, pencil-like poop. Get help right away if: You have a fever, and your symptoms suddenly get worse. You leak poop or have blood in your poop. Your belly feels hard or bigger than normal (bloated). You have very bad belly pain. You feel dizzy or you faint. Summary Constipation is when a person poops fewer than 3 times a week, has trouble pooping, or has poop that is dry, hard, or bigger than normal. Eat foods that have a lot of fiber. Drink enough fluid to keep your pee (urine) pale yellow. Take over-the-counter and prescription medicines only as told by your doctor. These include any fiber supplements. This information is not intended to replace advice given to you by your health care provider. Make sure you discuss any questions you have with your health care provider. Document Revised: 01/14/2019 Document Reviewed: 01/14/2019 Elsevier Patient Education  Thom[place], MD [facility]

## 2022-05-07 ENCOUNTER — Telehealth: Payer: Self-pay | Admitting: *Deleted

## 2022-05-07 ENCOUNTER — Other Ambulatory Visit: Payer: Self-pay | Admitting: Emergency Medicine

## 2022-05-07 ENCOUNTER — Telehealth: Payer: Self-pay | Admitting: Emergency Medicine

## 2022-05-07 DIAGNOSIS — K5904 Chronic idiopathic constipation: Secondary | ICD-10-CM

## 2022-05-07 MED ORDER — TRULANCE 3 MG PO TABS: 3.0000 mg | ORAL_TABLET | Freq: Every day | ORAL | 3 refills | Status: DC

## 2022-05-07 MED ORDER — CLOBETASOL PROPIONATE 0.05 % EX CREA: 1.0000 | TOPICAL_CREAM | Freq: Two times a day (BID) | CUTANEOUS | 0 refills | Status: DC

## 2022-05-07 NOTE — Telephone Encounter (Signed)
Patient called states the prescription for trulance '3mg'$  is not working for him, says the cream he was prescribed is fine but the trulance is not and would like something else prescribed. Patient also asked if his lab results could be mailed to him.

## 2022-05-07 NOTE — Telephone Encounter (Signed)
New prescription for clobetasol cream sent to pharmacy of record today.  Thanks.

## 2022-05-07 NOTE — Telephone Encounter (Signed)
Patient has chronic constipation.  Recommend to continue Trulance because it takes some time to start working and in the meantime take milk of magnesia along with over-the-counter stool softeners.

## 2022-05-07 NOTE — Telephone Encounter (Signed)
Got a fax from pharmacy, the Triamcinolon cream is not working patient is still itching, please advise on another medication

## 2022-05-07 NOTE — Telephone Encounter (Signed)
Please see message. Patient states the med Trulance is not working wants something different

## 2022-05-07 NOTE — Telephone Encounter (Signed)
Called patient and informed him of provider recommendation.

## 2022-05-07 NOTE — Telephone Encounter (Signed)
Please advise  Lab results mailed to patient address on file

## 2022-05-11 NOTE — Telephone Encounter (Signed)
Called patient and informed him that he can take the Dulcolax stool softener to help alleviate the constipation.

## 2022-05-11 NOTE — Telephone Encounter (Signed)
Patient says he is still constipated and would like to know if he can use Dulcelax. If he can, he would like to know how much to take. Best callback is [phone].

## 2022-06-01 ENCOUNTER — Telehealth: Payer: Self-pay

## 2022-06-01 NOTE — Telephone Encounter (Signed)
Patient called and states that he would like rx for pain. He saw Dr. Laurance Flatten in Jan and states that the pain has increased in his back and that he can not sleep. Asked for a cb [phone] and would like rx sent to [facility] on [address].

## 2022-06-05 MED ORDER — METHOCARBAMOL 500 MG PO TABS: 500.0000 mg | ORAL_TABLET | Freq: Three times a day (TID) | ORAL | 0 refills | Status: DC | PRN

## 2022-06-05 NOTE — Telephone Encounter (Signed)
Patient called triage stating that he would like some pain medication sent in. I asked if he preferred muscle relaxer or lyrica he stated that his Dr. Franco Nones what is best to be sent in.   Please advise

## 2022-06-05 NOTE — Addendum Note (Signed)
Addended by: Ileene Rubens on: 06/05/2022 10:19 AM   Modules accepted: Orders

## 2022-06-05 NOTE — Telephone Encounter (Signed)
I called and lmom for pt to let him know we sent in meds for him.

## 2022-10-18 ENCOUNTER — Ambulatory Visit (INDEPENDENT_AMBULATORY_CARE_PROVIDER_SITE_OTHER): Payer: 59

## 2022-10-18 VITALS — BP 120/78 | Ht 68.75 in | Wt 179.0 lb

## 2022-10-18 DIAGNOSIS — Z0000 Encounter for general adult medical examination without abnormal findings: Secondary | ICD-10-CM

## 2022-10-18 NOTE — Progress Notes (Deleted)
Subjective:   Levi Phillips is a 82 y.o. male who presents for an Initial Medicare Annual Wellness Visit.  Visit Complete: In person  6CIT was not done due to patient not quite understanding the all of the questions or how to answer them.   Review of Systems  Cardiac Risk Factors include: advanced age (>87men, >69 women);male gender     Objective:    Today's Vitals   10/18/22 0959 10/18/22 1002  BP: 120/78   Weight: 179 lb (81.2 kg)   Height: 5' 8.75" (1.746 m)   PainSc:  5    Body mass index is 26.63 kg/m.     11/08/2021   11:07 AM 06/26/2021   12:59 AM 08/23/2020    1:40 PM 08/22/2020   10:27 PM 03/13/2016    3:18 PM 02/28/2016   11:35 AM  Advanced Directives  Does Patient Have a Medical Advance Directive? No No No No No No  Would patient like information on creating a medical advance directive? No - Patient declined  No - Patient declined  No - Patient declined     Current Medications (verified) Outpatient Encounter Medications as of 10/18/2022  Medication Sig   allopurinol (ZYLOPRIM) 100 MG tablet Take 1 tablet (100 mg total) by mouth daily.   clobetasol cream (TEMOVATE) 0.05 % Apply 1 Application topically 2 (two) times daily.   ibuprofen (ADVIL) 600 MG tablet Take 1 tablet (600 mg total) by mouth every 6 (six) hours as needed for fever, mild pain or moderate pain.   methocarbamol (ROBAXIN) 500 MG tablet Take 1 tablet (500 mg total) by mouth every 8 (eight) hours as needed (back pain, spasms).   Plecanatide (TRULANCE) 3 MG TABS Take 1 tablet (3 mg total) by mouth daily.   tamsulosin (FLOMAX) 0.4 MG CAPS capsule Take 1 capsule (0.4 mg total) by mouth daily.   triamcinolone cream (KENALOG) 0.1 % Apply 1 Application topically 2 (two) times daily.   No facility-administered encounter medications on file as of 10/18/2022.    Allergies (verified) Patient has no known allergies.   History: Past Medical History:  Diagnosis Date   Allergy    Anemia    Arthritis     Back pain    spondylolisthesis   Enlarged prostate    takes Flomax daily   Gout    takes Allopurinol daily   Past Surgical History:  Procedure Laterality Date   cataract surgery Bilateral    EYE SURGERY     LITHOTRIPSY     done in [place]   Family History  Problem Relation Age of Onset   Cancer Mother    Alzheimer's disease Sister    Alzheimer's disease Sister    Social History   Socioeconomic History   Marital status: Married    Spouse name: Not on file   Number of children: 3   Years of education: Not on file   Highest education level: Not on file  Occupational History   Occupation: Retired  Tobacco Use   Smoking status: Never   Smokeless tobacco: Never  Vaping Use   Vaping status: Never Used  Substance and Sexual Activity   Alcohol use: Yes    Comment: occ beer   Drug use: No   Sexual activity: Not on file  Other Topics Concern   Not on file  Social History Narrative   ** Merged History Encounter **       Social Determinants of Health   Financial Resource Strain: Not on file  Food Insecurity: Not on file  Transportation Needs: No Transportation Needs (10/18/2022)   PRAPARE - Administrator, Civil Service (Medical): No    Lack of Transportation (Non-Medical): No  Physical Activity: Not on file  Stress: Not on file  Social Connections: Not on file    Tobacco Counseling Counseling given: Not Answered   Clinical Intake:  Pre-visit preparation completed: Yes  Pain : 0-10 Pain Score: 5  Pain Location: Back Pain Orientation: Left, Lower Pain Radiating Towards: goes down to lt thigh and lt knee Pain Descriptors / Indicators: Aching Pain Onset: More than a month ago Pain Frequency: Intermittent Pain Relieving Factors: when he starts exercising and walking  Pain Relieving Factors: when he starts exercising and walking  BMI - recorded: 26.63 Nutritional Status: BMI 25 -29 Overweight Diabetes: No     Interpreter Needed?:  Yes  Information entered by :: Reginna Sermeno. RMA   Activities of Daily Living    10/18/2022   10:09 AM  In your present state of health, do you have any difficulty performing the following activities:  Hearing? 1  Comment somewhat  Vision? 0  Walking or climbing stairs? 0  Dressing or bathing? 0  Doing errands, shopping? 0  Preparing Food and eating ? N  Using the Toilet? N  In the past six months, have you accidently leaked urine? N  Do you have problems with loss of bowel control? N  Managing your Medications? N  Managing your Finances? N  Housekeeping or managing your Housekeeping? N    Patient Care Team: Georgina Quint, MD as PCP - General (Internal Medicine) Patient, No Pcp Per (General Practice) Alfredo Martinez, MD as Consulting Physician (Urology) Bjorn Pippin, MD as Attending Physician (Urology)  Indicate any recent Medical Services you may have received from other than [facility] providers in the past year (date may be approximate).     Assessment:   This is a routine wellness examination for [place].  Hearing/Vision screen Hearing Screening - Comments:: Per pt-somewhat sometimes Vision Screening - Comments:: Wears eyeglasses  Dietary issues and exercise activities discussed:     Goals Addressed   None   Depression Screen    10/18/2022   10:14 AM 05/02/2022    9:02 AM 01/02/2022    2:59 PM 12/25/2021    9:59 AM 07/05/2021    1:56 PM 02/28/2021    8:04 AM 08/02/2020    3:08 PM  PHQ 2/9 Scores  PHQ - 2 Score 1 0 0 2 0 0 0  PHQ- 9 Score 7   10       Fall Risk    10/18/2022   10:13 AM 05/02/2022    9:02 AM 01/02/2022    2:59 PM 12/25/2021    9:58 AM 07/05/2021    4:28 PM  Fall Risk   Falls in the past year? 0 0 0 0 1  Number falls in past yr: 0 0 0 0 0  Injury with Fall? 0 0 0 0 1  Risk for fall due to : No Fall Risks No Fall Risks No Fall Risks No Fall Risks   Follow up Falls prevention discussed Falls evaluation completed Falls evaluation  completed Falls evaluation completed     MEDICARE RISK AT HOME:  Medicare Risk at Home - 10/18/22 1013     Any stairs in or around the home? No    Home free of loose throw rugs in walkways, pet beds, electrical cords, etc? Yes  Adequate lighting in your home to reduce risk of falls? Yes    Use of a cane, walker or w/c? No    Grab bars in the bathroom? No    Shower chair or bench in shower? No    Elevated toilet seat or a handicapped toilet? No             TIMED UP AND GO:  Was the test performed? No    Cognitive Function:      06/17/2019   10:37 AM 06/17/2019   10:11 AM  Montreal Cognitive Assessment   Visuospatial/ Executive (0/5) 3.5 5  Naming (0/3) 3 3  Attention: Read list of digits (0/2) 2   Attention: Read list of letters (0/1) 1   Attention: Serial 7 subtraction starting at 100 (0/3) 3   Language: Repeat phrase (0/2) 2   Language : Fluency (0/1) 1   Abstraction (0/2) 0   Delayed Recall (0/5) 5   Orientation (0/6) 6   Total 26.5   Adjusted Score (based on education) 26.5       06/17/2019    9:09 AM  6CIT Screen  What Year? 0 points  What month? 0 points  What time? 0 points  Count back from 20 2 points  Months in reverse 0 points  Repeat phrase --    Immunizations Immunization History  Administered Date(s) Administered   Fluad Quad(high Dose 65+) 12/01/2018, 02/26/2020   Influenza-Unspecified 01/29/2021   PFIZER(Purple Top)SARS-COV-2 Vaccination 05/08/2019, 06/03/2019, 03/18/2020    TDAP status: Due, Education has been provided regarding the importance of this vaccine. Advised may receive this vaccine at local pharmacy or Health Dept. Aware to provide a copy of the vaccination record if obtained from local pharmacy or Health Dept. Verbalized acceptance and understanding.  Flu Vaccine status: Up to date  Pneumococcal vaccine status: Declined,  Education has been provided regarding the importance of this vaccine but patient still declined. Advised  may receive this vaccine at local pharmacy or Health Dept. Aware to provide a copy of the vaccination record if obtained from local pharmacy or Health Dept. Verbalized acceptance and understanding.   Covid-19 vaccine status: Declined, Education has been provided regarding the importance of this vaccine but patient still declined. Advised may receive this vaccine at local pharmacy or Health Dept.or vaccine clinic. Aware to provide a copy of the vaccination record if obtained from local pharmacy or Health Dept. Verbalized acceptance and understanding.  Qualifies for Shingles Vaccine? Yes   Zostavax completed No   Shingrix Completed?: No.    Education has been provided regarding the importance of this vaccine. Patient has been advised to call insurance company to determine out of pocket expense if they have not yet received this vaccine. Advised may also receive vaccine at local pharmacy or Health Dept. Verbalized acceptance and understanding.  Screening Tests Health Maintenance  Topic Date Due   DTaP/Tdap/Td (1 - Tdap) Never done   Zoster Vaccines- Shingrix (1 of 2) Never done   COVID-19 Vaccine (4 - 2023-24 season) 11/10/2021   INFLUENZA VACCINE  10/11/2022   Pneumonia Vaccine 36+ Years old (1 of 1 - PCV) 05/03/2023 (Originally 02/[birth year]/2005)   Medicare Annual Wellness (AWV)  10/18/2023   HPV VACCINES  Aged Out    Health Maintenance  Health Maintenance Due  Topic Date Due   DTaP/Tdap/Td (1 - Tdap) Never done   Zoster Vaccines- Shingrix (1 of 2) Never done   COVID-19 Vaccine (4 - 2023-24 season) 11/10/2021   INFLUENZA  VACCINE  10/11/2022    Colorectal cancer screening: No longer required.   Lung Cancer Screening: (Low Dose CT Chest recommended if Age 14-80 years, 20 pack-year currently smoking OR have quit w/in 15years.) does not qualify.   Lung Cancer Screening Referral: N/A  Additional Screening:  Hepatitis C Screening: does not qualify;   Vision Screening: Recommended annual  ophthalmology exams for early detection of glaucoma and other disorders of the eye. Is the patient up to date with their annual eye exam?  Yes  Who is the provider or what is the name of the office in which the patient attends annual eye exams? [facility] If pt is not established with a provider, would they like to be referred to a provider to establish care? No .   Dental Screening: Recommended annual dental exams for proper oral hygiene    Community Resource Referral / Chronic Care Management: CRR required this visit?  No   CCM required this visit?  No    Plan:     I have personally reviewed and noted the following in the patient's chart:   Medical and social history Use of alcohol, tobacco or illicit drugs  Current medications and supplements including opioid prescriptions. Patient is not currently taking opioid prescriptions. Functional ability and status Nutritional status Physical activity Advanced directives List of other physicians Hospitalizations, surgeries, and ER visits in previous 12 months Vitals Screenings to include cognitive, depression, and falls Referrals and appointments  In addition, I have reviewed and discussed with patient certain preventive protocols, quality metrics, and best practice recommendations. A written personalized care plan for preventive services as well as general preventive health recommendations were provided to patient.     Ruthetta Koopmann L Millard Bautch, CMA   10/18/2022   After Visit Summary: (Pick Up) Due to this being a telephonic visit, with patients personalized plan was offered to patient and patient has requested to Pick up at office.  Nurse Notes: Patient has no concerns today.  He will wait to schedule for next year's AWVs.  6CIT was not done due to patient not quite understanding the all of the questions or how to answer them.

## 2022-10-18 NOTE — Patient Instructions (Signed)
Levi Phillips , Thank you for taking time to come for your Medicare Wellness Visit. I appreciate your ongoing commitment to your health goals. Please review the following plan we discussed and let me know if I can assist you in the future.   Referrals/Orders/Follow-Ups/Clinician Recommendations: Keep up the good work.  This is a list of the screening recommended for you and due dates:  Health Maintenance  Topic Date Due   DTaP/Tdap/Td vaccine (1 - Tdap) Never done   Zoster (Shingles) Vaccine (1 of 2) Never done   COVID-19 Vaccine (4 - 2023-24 season) 11/10/2021   Flu Shot  10/11/2022   Pneumonia Vaccine (1 of 1 - PCV) 05/03/2023*   Medicare Annual Wellness Visit  10/18/2023   HPV Vaccine  Aged Out  *Topic was postponed. The date shown is not the original due date.    Advanced directives: (Declined) Advance directive discussed with you today. Even though you declined this today, please call our office should you change your mind, and we can give you the proper paperwork for you to fill out.  Next Medicare Annual Wellness Visit scheduled for next year: No  Preventive Care 12 Years and Older, Male  Preventive care refers to lifestyle choices and visits with your health care provider that can promote health and wellness. What does preventive care include? A yearly physical exam. This is also called an annual well check. Dental exams once or twice a year. Routine eye exams. Ask your health care provider how often you should have your eyes checked. Personal lifestyle choices, including: Daily care of your teeth and gums. Regular physical activity. Eating a healthy diet. Avoiding tobacco and drug use. Limiting alcohol use. Practicing safe sex. Taking low doses of aspirin every day. Taking vitamin and mineral supplements as recommended by your health care provider. What happens during an annual well check? The services and screenings done by your health care provider during your annual  well check will depend on your age, overall health, lifestyle risk factors, and family history of disease. Counseling  Your health care provider may ask you questions about your: Alcohol use. Tobacco use. Drug use. Emotional well-being. Home and relationship well-being. Sexual activity. Eating habits. History of falls. Memory and ability to understand (cognition). Work and work Astronomer. Screening  You may have the following tests or measurements: Height, weight, and BMI. Blood pressure. Lipid and cholesterol levels. These may be checked every 5 years, or more frequently if you are over 30 years old. Skin check. Lung cancer screening. You may have this screening every year starting at age 78 if you have a 30-pack-year history of smoking and currently smoke or have quit within the past 15 years. Fecal occult blood test (FOBT) of the stool. You may have this test every year starting at age 13. Flexible sigmoidoscopy or colonoscopy. You may have a sigmoidoscopy every 5 years or a colonoscopy every 10 years starting at age 29. Prostate cancer screening. Recommendations will vary depending on your family history and other risks. Hepatitis C blood test. Hepatitis B blood test. Sexually transmitted disease (STD) testing. Diabetes screening. This is done by checking your blood sugar (glucose) after you have not eaten for a while (fasting). You may have this done every 1-3 years. Abdominal aortic aneurysm (AAA) screening. You may need this if you are a current or former smoker. Osteoporosis. You may be screened starting at age 10 if you are at high risk. Talk with your health care provider about your test  results, treatment options, and if necessary, the need for more tests. Vaccines  Your health care provider may recommend certain vaccines, such as: Influenza vaccine. This is recommended every year. Tetanus, diphtheria, and acellular pertussis (Tdap, Td) vaccine. You may need a Td booster  every 10 years. Zoster vaccine. You may need this after age 62. Pneumococcal 13-valent conjugate (PCV13) vaccine. One dose is recommended after age 34. Pneumococcal polysaccharide (PPSV23) vaccine. One dose is recommended after age 19. Talk to your health care provider about which screenings and vaccines you need and how often you need them. This information is not intended to replace advice given to you by your health care provider. Make sure you discuss any questions you have with your health care provider. Document Released: 03/25/2015 Document Revised: 11/16/2015 Document Reviewed: 12/28/2014 Elsevier Interactive Patient Education  2017 [employer].  Fall Prevention in the Home Falls can cause injuries. They can happen to people of all ages. There are many things you can do to make your home safe and to help prevent falls. What can I do on the outside of my home? Regularly fix the edges of walkways and driveways and fix any cracks. Remove anything that might make you trip as you walk through a door, such as a raised step or threshold. Trim any bushes or trees on the path to your home. Use bright outdoor lighting. Clear any walking paths of anything that might make someone trip, such as rocks or tools. Regularly check to see if handrails are loose or broken. Make sure that both sides of any steps have handrails. Any raised decks and porches should have guardrails on the edges. Have any leaves, snow, or ice cleared regularly. Use sand or salt on walking paths during winter. Clean up any spills in your garage right away. This includes oil or grease spills. What can I do in the bathroom? Use night lights. Install grab bars by the toilet and in the tub and shower. Do not use towel bars as grab bars. Use non-skid mats or decals in the tub or shower. If you need to sit down in the shower, use a plastic, non-slip stool. Keep the floor dry. Clean up any water that spills on the floor as soon  as it happens. Remove soap buildup in the tub or shower regularly. Attach bath mats securely with double-sided non-slip rug tape. Do not have throw rugs and other things on the floor that can make you trip. What can I do in the bedroom? Use night lights. Make sure that you have a light by your bed that is easy to reach. Do not use any sheets or blankets that are too big for your bed. They should not hang down onto the floor. Have a firm chair that has side arms. You can use this for support while you get dressed. Do not have throw rugs and other things on the floor that can make you trip. What can I do in the kitchen? Clean up any spills right away. Avoid walking on wet floors. Keep items that you use a lot in easy-to-reach places. If you need to reach something above you, use a strong step stool that has a grab bar. Keep electrical cords out of the way. Do not use floor polish or wax that makes floors slippery. If you must use wax, use non-skid floor wax. Do not have throw rugs and other things on the floor that can make you trip. What can I do with my stairs?  Do not leave any items on the stairs. Make sure that there are handrails on both sides of the stairs and use them. Fix handrails that are broken or loose. Make sure that handrails are as long as the stairways. Check any carpeting to make sure that it is firmly attached to the stairs. Fix any carpet that is loose or worn. Avoid having throw rugs at the top or bottom of the stairs. If you do have throw rugs, attach them to the floor with carpet tape. Make sure that you have a light switch at the top of the stairs and the bottom of the stairs. If you do not have them, ask someone to add them for you. What else can I do to help prevent falls? Wear shoes that: Do not have high heels. Have rubber bottoms. Are comfortable and fit you well. Are closed at the toe. Do not wear sandals. If you use a stepladder: Make sure that it is fully  opened. Do not climb a closed stepladder. Make sure that both sides of the stepladder are locked into place. Ask someone to hold it for you, if possible. Clearly mark and make sure that you can see: Any grab bars or handrails. First and last steps. Where the edge of each step is. Use tools that help you move around (mobility aids) if they are needed. These include: Canes. Walkers. Scooters. Crutches. Turn on the lights when you go into a dark area. Replace any light bulbs as soon as they burn out. Set up your furniture so you have a clear path. Avoid moving your furniture around. If any of your floors are uneven, fix them. If there are any pets around you, be aware of where they are. Review your medicines with your doctor. Some medicines can make you feel dizzy. This can increase your chance of falling. Ask your doctor what other things that you can do to help prevent falls. This information is not intended to replace advice given to you by your health care provider. Make sure you discuss any questions you have with your health care provider. Document Released: 12/23/2008 Document Revised: 08/04/2015 Document Reviewed: 04/02/2014 Elsevier Interactive Patient Education  2017 [employer].

## 2022-10-25 NOTE — Progress Notes (Signed)
Subjective:   Levi Phillips is a 82 y.o. male who presents for an Initial Medicare Annual Wellness Visit.  Visit Complete: In person  6CIT was not done due to patient not quite understanding the all of the questions or how to answer them.   Review of Systems  Cardiac Risk Factors include: advanced age (>33men, >50 women);male gender     Objective:    Today's Vitals   10/18/22 0959 10/18/22 1002  BP: 120/78   Weight: 179 lb (81.2 kg)   Height: 5' 8.75" (1.746 m)   PainSc:  5    Body mass index is 26.63 kg/m.     11/08/2021   11:07 AM 06/26/2021   12:59 AM 08/23/2020    1:40 PM 08/22/2020   10:27 PM 03/13/2016    3:18 PM 02/28/2016   11:35 AM  Advanced Directives  Does Patient Have a Medical Advance Directive? No No No No No No  Would patient like information on creating a medical advance directive? No - Patient declined  No - Patient declined  No - Patient declined     Current Medications (verified) Outpatient Encounter Medications as of 10/18/2022  Medication Sig   allopurinol (ZYLOPRIM) 100 MG tablet Take 1 tablet (100 mg total) by mouth daily.   clobetasol cream (TEMOVATE) 0.05 % Apply 1 Application topically 2 (two) times daily.   ibuprofen (ADVIL) 600 MG tablet Take 1 tablet (600 mg total) by mouth every 6 (six) hours as needed for fever, mild pain or moderate pain.   methocarbamol (ROBAXIN) 500 MG tablet Take 1 tablet (500 mg total) by mouth every 8 (eight) hours as needed (back pain, spasms).   Plecanatide (TRULANCE) 3 MG TABS Take 1 tablet (3 mg total) by mouth daily.   tamsulosin (FLOMAX) 0.4 MG CAPS capsule Take 1 capsule (0.4 mg total) by mouth daily.   triamcinolone cream (KENALOG) 0.1 % Apply 1 Application topically 2 (two) times daily.   No facility-administered encounter medications on file as of 10/18/2022.    Allergies (verified) Patient has no known allergies.   History: Past Medical History:  Diagnosis Date   Allergy    Anemia    Arthritis     Back pain    spondylolisthesis   Enlarged prostate    takes Flomax daily   Gout    takes Allopurinol daily   Past Surgical History:  Procedure Laterality Date   cataract surgery Bilateral    EYE SURGERY     LITHOTRIPSY     done in [place]   Family History  Problem Relation Age of Onset   Cancer Mother    Alzheimer's disease Sister    Alzheimer's disease Sister    Social History   Socioeconomic History   Marital status: Married    Spouse name: Not on file   Number of children: 3   Years of education: Not on file   Highest education level: Not on file  Occupational History   Occupation: Retired  Tobacco Use   Smoking status: Never   Smokeless tobacco: Never  Vaping Use   Vaping status: Never Used  Substance and Sexual Activity   Alcohol use: Yes    Comment: occ beer   Drug use: No   Sexual activity: Not on file  Other Topics Concern   Not on file  Social History Narrative   ** Merged History Encounter **       Social Determinants of Health   Financial Resource Strain: Medium Risk (10/18/2022)  Overall Financial Resource Strain (CARDIA)    Difficulty of Paying Living Expenses: Somewhat hard  Food Insecurity: No Food Insecurity (10/18/2022)   Hunger Vital Sign    Worried About Running Out of Food in the Last Year: Never true    Ran Out of Food in the Last Year: Never true  Transportation Needs: No Transportation Needs (10/18/2022)   PRAPARE - Administrator, Civil Service (Medical): No    Lack of Transportation (Non-Medical): No  Physical Activity: Sufficiently Active (10/18/2022)   Exercise Vital Sign    Days of Exercise per Week: 7 days    Minutes of Exercise per Session: 40 min  Stress: Not on file  Social Connections: Moderately Isolated (10/18/2022)   Social Connection and Isolation Panel [NHANES]    Frequency of Communication with Friends and Family: More than three times a week    Frequency of Social Gatherings with Friends and Family: Once a  week    Attends Religious Services: Never    Database administrator or Organizations: No    Attends Engineer, structural: Never    Marital Status: Married    Tobacco Counseling Counseling given: Not Answered   Clinical Intake:  Pre-visit preparation completed: Yes  Pain : 0-10 Pain Score: 5  Pain Location: Back Pain Orientation: Left, Lower Pain Radiating Towards: goes down to lt thigh and lt knee Pain Descriptors / Indicators: Aching Pain Onset: More than a month ago Pain Frequency: Intermittent Pain Relieving Factors: when he starts exercising and walking  Pain Relieving Factors: when he starts exercising and walking  BMI - recorded: 26.63 Nutritional Status: BMI 25 -29 Overweight Diabetes: No  How often do you need to have someone help you when you read instructions, pamphlets, or other written materials from your doctor or pharmacy?: 4 - Often  Interpreter Needed?: Yes Interpreter Name: Georgiann Hahn Interpreter ID: [id number] Patient Declined Interpreter : No  Information entered by :: Ellington Cornia. RMA   Activities of Daily Living    10/18/2022   10:09 AM  In your present state of health, do you have any difficulty performing the following activities:  Hearing? 1  Comment somewhat  Vision? 0  Walking or climbing stairs? 0  Dressing or bathing? 0  Doing errands, shopping? 0  Preparing Food and eating ? N  Using the Toilet? N  In the past six months, have you accidently leaked urine? N  Do you have problems with loss of bowel control? N  Managing your Medications? N  Managing your Finances? N  Housekeeping or managing your Housekeeping? N    Patient Care Team: Georgina Quint, MD as PCP - General (Internal Medicine) Patient, No Pcp Per (General Practice) Alfredo Martinez, MD as Consulting Physician (Urology) Bjorn Pippin, MD as Attending Physician (Urology)  Indicate any recent Medical Services you may have received from other than [facility] providers  in the past year (date may be approximate).     Assessment:   This is a routine wellness examination for Levi Phillips.  Hearing/Vision screen Hearing Screening - Comments:: Per pt-somewhat sometimes Vision Screening - Comments:: Wears eyeglasses  Dietary issues and exercise activities discussed:     Goals Addressed   None   Depression Screen    10/18/2022   10:14 AM 05/02/2022    9:02 AM 01/02/2022    2:59 PM 12/25/2021    9:59 AM 07/05/2021    1:56 PM 02/28/2021    8:04 AM 08/02/2020    3:08 PM  PHQ 2/9 Scores  PHQ - 2 Score 1 0 0 2 0 0 0  PHQ- 9 Score 7   10       Fall Risk    10/18/2022   10:13 AM 05/02/2022    9:02 AM 01/02/2022    2:59 PM 12/25/2021    9:58 AM 07/05/2021    4:28 PM  Fall Risk   Falls in the past year? 0 0 0 0 1  Number falls in past yr: 0 0 0 0 0  Injury with Fall? 0 0 0 0 1  Risk for fall due to : No Fall Risks No Fall Risks No Fall Risks No Fall Risks   Follow up Falls prevention discussed Falls evaluation completed Falls evaluation completed Falls evaluation completed     MEDICARE RISK AT HOME:    TIMED UP AND GO:  Was the test performed? No    Cognitive Function:      06/17/2019   10:37 AM 06/17/2019   10:11 AM  Montreal Cognitive Assessment   Visuospatial/ Executive (0/5) 3.5 5  Naming (0/3) 3 3  Attention: Read list of digits (0/2) 2   Attention: Read list of letters (0/1) 1   Attention: Serial 7 subtraction starting at 100 (0/3) 3   Language: Repeat phrase (0/2) 2   Language : Fluency (0/1) 1   Abstraction (0/2) 0   Delayed Recall (0/5) 5   Orientation (0/6) 6   Total 26.5   Adjusted Score (based on education) 26.5       10/25/2022    8:52 AM 06/17/2019    9:09 AM  6CIT Screen  What Year? 0 points 0 points  What month? 0 points 0 points  What time?  0 points  Count back from 20  2 points  Months in reverse  0 points  Repeat phrase  --    Immunizations Immunization History  Administered Date(s) Administered   Fluad  Quad(high Dose 65+) 12/01/2018, 02/26/2020   Influenza-Unspecified 01/29/2021   PFIZER(Purple Top)SARS-COV-2 Vaccination 05/08/2019, 06/03/2019, 03/18/2020    TDAP status: Due, Education has been provided regarding the importance of this vaccine. Advised may receive this vaccine at local pharmacy or Health Dept. Aware to provide a copy of the vaccination record if obtained from local pharmacy or Health Dept. Verbalized acceptance and understanding.  Flu Vaccine status: Up to date  Pneumococcal vaccine status: Declined,  Education has been provided regarding the importance of this vaccine but patient still declined. Advised may receive this vaccine at local pharmacy or Health Dept. Aware to provide a copy of the vaccination record if obtained from local pharmacy or Health Dept. Verbalized acceptance and understanding.   Covid-19 vaccine status: Declined, Education has been provided regarding the importance of this vaccine but patient still declined. Advised may receive this vaccine at local pharmacy or Health Dept.or vaccine clinic. Aware to provide a copy of the vaccination record if obtained from local pharmacy or Health Dept. Verbalized acceptance and understanding.  Qualifies for Shingles Vaccine? Yes   Zostavax completed No   Shingrix Completed?: No.    Education has been provided regarding the importance of this vaccine. Patient has been advised to call insurance company to determine out of pocket expense if they have not yet received this vaccine. Advised may also receive vaccine at local pharmacy or Health Dept. Verbalized acceptance and understanding.  Screening Tests Health Maintenance  Topic Date Due   DTaP/Tdap/Td (1 - Tdap) Never done   Zoster Vaccines-  Shingrix (1 of 2) Never done   COVID-19 Vaccine (4 - 2023-24 season) 11/10/2021   INFLUENZA VACCINE  10/11/2022   Pneumonia Vaccine 71+ Years old (1 of 1 - PCV) 05/03/2023 (Originally 02/08/2005)   Medicare Annual Wellness (AWV)   10/18/2023   HPV VACCINES  Aged Out    Health Maintenance  Health Maintenance Due  Topic Date Due   DTaP/Tdap/Td (1 - Tdap) Never done   Zoster Vaccines- Shingrix (1 of 2) Never done   COVID-19 Vaccine (4 - 2023-24 season) 11/10/2021   INFLUENZA VACCINE  10/11/2022    Colorectal cancer screening: No longer required.   Lung Cancer Screening: (Low Dose CT Chest recommended if Age 73-80 years, 20 pack-year currently smoking OR have quit w/in 15years.) does not qualify.   Lung Cancer Screening Referral: N/A  Additional Screening:  Hepatitis C Screening: does not qualify;   Vision Screening: Recommended annual ophthalmology exams for early detection of glaucoma and other disorders of the eye. Is the patient up to date with their annual eye exam?  Yes  Who is the provider or what is the name of the office in which the patient attends annual eye exams? [facility] If pt is not established with a provider, would they like to be referred to a provider to establish care? No .   Dental Screening: Recommended annual dental exams for proper oral hygiene    Community Resource Referral / Chronic Care Management: CRR required this visit?  No   CCM required this visit?  No    Plan:     I have personally reviewed and noted the following in the patient's chart:   Medical and social history Use of alcohol, tobacco or illicit drugs  Current medications and supplements including opioid prescriptions. Patient is not currently taking opioid prescriptions. Functional ability and status Nutritional status Physical activity Advanced directives List of other physicians Hospitalizations, surgeries, and ER visits in previous 12 months Vitals Screenings to include cognitive, depression, and falls Referrals and appointments  In addition, I have reviewed and discussed with patient certain preventive protocols, quality metrics, and best practice recommendations. A written personalized care plan  for preventive services as well as general preventive health recommendations were provided to patient.     Levi Phillips, CMA   10/18/2022   After Visit Summary: (Pick Up) Due to this being a telephonic visit, with patients personalized plan was offered to patient and patient has requested to Pick up at office.  Nurse Notes: Patient has no concerns today.  He will wait to schedule for next year's AWVs.  6CIT was not done due to patient not quite understanding the all of the questions or how to answer them.

## 2022-12-26 ENCOUNTER — Ambulatory Visit: Payer: 59 | Admitting: Emergency Medicine

## 2022-12-31 ENCOUNTER — Ambulatory Visit: Payer: 59 | Admitting: Emergency Medicine

## 2022-12-31 ENCOUNTER — Telehealth: Payer: Self-pay | Admitting: Emergency Medicine

## 2022-12-31 ENCOUNTER — Encounter: Payer: Self-pay | Admitting: Emergency Medicine

## 2022-12-31 VITALS — BP 122/80 | HR 73 | Temp 98.4°F | Ht 68.75 in | Wt 182.5 lb

## 2022-12-31 DIAGNOSIS — K5904 Chronic idiopathic constipation: Secondary | ICD-10-CM

## 2022-12-31 DIAGNOSIS — Z13220 Encounter for screening for lipoid disorders: Secondary | ICD-10-CM | POA: Diagnosis not present

## 2022-12-31 DIAGNOSIS — Z87442 Personal history of urinary calculi: Secondary | ICD-10-CM

## 2022-12-31 DIAGNOSIS — Z130 Encounter for screening for diseases of the blood and blood-forming organs and certain disorders involving the immune mechanism: Secondary | ICD-10-CM

## 2022-12-31 DIAGNOSIS — M48061 Spinal stenosis, lumbar region without neurogenic claudication: Secondary | ICD-10-CM

## 2022-12-31 DIAGNOSIS — Z0001 Encounter for general adult medical examination with abnormal findings: Secondary | ICD-10-CM

## 2022-12-31 DIAGNOSIS — Z1329 Encounter for screening for other suspected endocrine disorder: Secondary | ICD-10-CM | POA: Diagnosis not present

## 2022-12-31 DIAGNOSIS — M5442 Lumbago with sciatica, left side: Secondary | ICD-10-CM | POA: Diagnosis not present

## 2022-12-31 DIAGNOSIS — Z13228 Encounter for screening for other metabolic disorders: Secondary | ICD-10-CM | POA: Diagnosis not present

## 2022-12-31 DIAGNOSIS — N400 Enlarged prostate without lower urinary tract symptoms: Secondary | ICD-10-CM

## 2022-12-31 DIAGNOSIS — G8929 Other chronic pain: Secondary | ICD-10-CM

## 2022-12-31 LAB — URINALYSIS
Bilirubin Urine: NEGATIVE
Hgb urine dipstick: NEGATIVE
Ketones, ur: NEGATIVE
Leukocytes,Ua: NEGATIVE
Nitrite: NEGATIVE
Specific Gravity, Urine: 1.01 (ref 1.000–1.030)
Total Protein, Urine: NEGATIVE
Urine Glucose: NEGATIVE
Urobilinogen, UA: 0.2 (ref 0.0–1.0)
pH: 6.5 (ref 5.0–8.0)

## 2022-12-31 LAB — LIPID PANEL
Cholesterol: 169 mg/dL (ref 0–200)
HDL: 39.3 mg/dL (ref >39.00)
LDL Cholesterol: 94 mg/dL (ref 0–99)
NonHDL: 129.42
Total CHOL/HDL Ratio: 4
Triglycerides: 178 mg/dL — ABNORMAL HIGH (ref 0.0–149.0)
VLDL: 35.6 mg/dL (ref 0.0–40.0)

## 2022-12-31 LAB — CBC WITH DIFFERENTIAL/PLATELET
Basophils Absolute: 0.1 10*3/uL (ref 0.0–0.1)
Basophils Relative: 0.5 % (ref 0.0–3.0)
Eosinophils Absolute: 0.8 10*3/uL — ABNORMAL HIGH (ref 0.0–0.7)
Eosinophils Relative: 8.2 % — ABNORMAL HIGH (ref 0.0–5.0)
HCT: 43.2 % (ref 39.0–52.0)
Hemoglobin: 14.2 g/dL (ref 13.0–17.0)
Lymphocytes Relative: 28.2 % (ref 12.0–46.0)
Lymphs Abs: 2.7 10*3/uL (ref 0.7–4.0)
MCHC: 32.8 g/dL (ref 30.0–36.0)
MCV: 88.1 fL (ref 78.0–100.0)
Monocytes Absolute: 0.8 10*3/uL (ref 0.1–1.0)
Monocytes Relative: 8.6 % (ref 3.0–12.0)
Neutro Abs: 5.2 10*3/uL (ref 1.4–7.7)
Neutrophils Relative %: 54.5 % (ref 43.0–77.0)
Platelets: 215 10*3/uL (ref 150.0–400.0)
RBC: 4.91 Mil/uL (ref 4.22–5.81)
RDW: 14.3 % (ref 11.5–15.5)
WBC: 9.5 10*3/uL (ref 4.0–10.5)

## 2022-12-31 LAB — COMPREHENSIVE METABOLIC PANEL
ALT: 16 U/L (ref 0–53)
AST: 20 U/L (ref 0–37)
Albumin: 4.4 g/dL (ref 3.5–5.2)
Alkaline Phosphatase: 63 U/L (ref 39–117)
BUN: 16 mg/dL (ref 6–23)
CO2: 29 meq/L (ref 19–32)
Calcium: 9.5 mg/dL (ref 8.4–10.5)
Chloride: 99 meq/L (ref 96–112)
Creatinine, Ser: 1.02 mg/dL (ref 0.40–1.50)
GFR: 68.26 mL/min (ref >60.00)
Glucose, Bld: 87 mg/dL (ref 70–99)
Potassium: 4.8 meq/L (ref 3.5–5.1)
Sodium: 134 meq/L — ABNORMAL LOW (ref 135–145)
Total Bilirubin: 0.4 mg/dL (ref 0.2–1.2)
Total Protein: 7.7 g/dL (ref 6.0–8.3)

## 2022-12-31 LAB — HEMOGLOBIN A1C: Hgb A1c MFr Bld: 5.8 % (ref 4.6–6.5)

## 2022-12-31 NOTE — Assessment & Plan Note (Signed)
Currently asymptomatic Recommend urinalysis and bilateral kidney ultrasound

## 2022-12-31 NOTE — Assessment & Plan Note (Signed)
Stable.  Takes Trulance 3 mg daily as needed Diet and nutrition discussed

## 2022-12-31 NOTE — Progress Notes (Signed)
Levi Phillips 82 y.o.   Chief Complaint  Patient presents with   Annual Exam    Patient is having pain right side, hip that radiates down his leg, sometimes he is unable to walk      HISTORY OF PRESENT ILLNESS: This is a 82 y.o. male here for annual exam. Complaining of intermittent pain to right lumbar area radiating down the leg Chronic constipation.  Decreased energy levels. No other complaints or medical concerns today.  HPI   Prior to Admission medications   Medication Sig Start Date End Date Taking? Authorizing Provider  allopurinol (ZYLOPRIM) 100 MG tablet Take 1 tablet (100 mg total) by mouth daily. 01/11/22  Yes Randall Rampersad, Eilleen Kempf, MD  clobetasol cream (TEMOVATE) 0.05 % Apply 1 Application topically 2 (two) times daily. 05/07/22  Yes Matraca Hunkins, Eilleen Kempf, MD  ibuprofen (ADVIL) 600 MG tablet Take 1 tablet (600 mg total) by mouth every 6 (six) hours as needed for fever, mild pain or moderate pain. 11/08/21  Yes Edwin Dada P, DO  methocarbamol (ROBAXIN) 500 MG tablet Take 1 tablet (500 mg total) by mouth every 8 (eight) hours as needed (back pain, spasms). 06/05/22  Yes London Sheer, MD  Plecanatide (TRULANCE) 3 MG TABS Take 1 tablet (3 mg total) by mouth daily. 05/07/22  Yes Docie Abramovich, Eilleen Kempf, MD  tamsulosin (FLOMAX) 0.4 MG CAPS capsule Take 1 capsule (0.4 mg total) by mouth daily. 07/05/21  Yes Jonavan Vanhorn, Eilleen Kempf, MD  triamcinolone cream (KENALOG) 0.1 % Apply 1 Application topically 2 (two) times daily. 04/16/22  Yes Georgina Quint, MD    No Known Allergies  Patient Active Problem List   Diagnosis Date Noted   Chronic idiopathic constipation 05/02/2022   Dizzy spells 05/02/2022   Itching 01/02/2022   Vasculitis (HCC) 12/25/2021   History of BPH 07/05/2021   Situational anxiety 08/02/2020   Spinal stenosis of lumbar region 10/15/2017   Chronic bilateral low back pain with left-sided sciatica 10/15/2017   History of vertigo 05/09/2017   History of  kidney stones 04/10/2017   DDD (degenerative disc disease), lumbar 01/09/2017   Gout 01/09/2017   BPH (benign prostatic hyperplasia) 01/09/2017   BMI 27.0-27.9,adult 01/09/2017    Past Medical History:  Diagnosis Date   Allergy    Anemia    Arthritis    Back pain    spondylolisthesis   Enlarged prostate    takes Flomax daily   Gout    takes Allopurinol daily    Past Surgical History:  Procedure Laterality Date   cataract surgery Bilateral    EYE SURGERY     LITHOTRIPSY     done in [place]    Social History   Socioeconomic History   Marital status: Married    Spouse name: Not on file   Number of children: 3   Years of education: Not on file   Highest education level: Not on file  Occupational History   Occupation: Retired  Tobacco Use   Smoking status: Never   Smokeless tobacco: Never  Vaping Use   Vaping status: Never Used  Substance and Sexual Activity   Alcohol use: Yes    Comment: occ beer   Drug use: No   Sexual activity: Not on file  Other Topics Concern   Not on file  Social History Narrative   ** Merged History Encounter **       Social Determinants of Health   Financial Resource Strain: Medium Risk (10/18/2022)   Overall Financial  Resource Strain (CARDIA)    Difficulty of Paying Living Expenses: Somewhat hard  Food Insecurity: No Food Insecurity (10/18/2022)   Hunger Vital Sign    Worried About Running Out of Food in the Last Year: Never true    Ran Out of Food in the Last Year: Never true  Transportation Needs: No Transportation Needs (10/18/2022)   PRAPARE - Administrator, Civil Service (Medical): No    Lack of Transportation (Non-Medical): No  Physical Activity: Sufficiently Active (10/18/2022)   Exercise Vital Sign    Days of Exercise per Week: 7 days    Minutes of Exercise per Session: 40 min  Stress: Not on file  Social Connections: Moderately Isolated (10/18/2022)   Social Connection and Isolation Panel [NHANES]    Frequency  of Communication with Friends and Family: More than three times a week    Frequency of Social Gatherings with Friends and Family: Once a week    Attends Religious Services: Never    Database administrator or Organizations: No    Attends Banker Meetings: Never    Marital Status: Married  Catering manager Violence: Not At Risk (10/18/2022)   Humiliation, Afraid, Rape, and Kick questionnaire    Fear of Current or Ex-Partner: No    Emotionally Abused: No    Physically Abused: No    Sexually Abused: No    Family History  Problem Relation Age of Onset   Cancer Mother    Alzheimer's disease Sister    Alzheimer's disease Sister      Review of Systems  Constitutional:  Positive for malaise/fatigue.  HENT: Negative.  Negative for congestion and sore throat.   Respiratory: Negative.  Negative for cough and shortness of breath.   Cardiovascular: Negative.  Negative for chest pain and palpitations.  Gastrointestinal:  Positive for constipation.  Genitourinary: Negative.  Negative for dysuria and hematuria.  Musculoskeletal:  Positive for back pain.  Skin: Negative.  Negative for rash.  Neurological: Negative.  Negative for dizziness and headaches.  All other systems reviewed and are negative.   Vitals:   12/31/22 1341  BP: 122/80  Pulse: 73  Temp: 98.4 F (36.9 C)  SpO2: 98%    Physical Exam Vitals reviewed.  Constitutional:      Appearance: Normal appearance.  HENT:     Head: Normocephalic.     Right Ear: Tympanic membrane, ear canal and external ear normal.     Left Ear: Tympanic membrane, ear canal and external ear normal.     Mouth/Throat:     Mouth: Mucous membranes are moist.     Pharynx: Oropharynx is clear.  Eyes:     Extraocular Movements: Extraocular movements intact.     Conjunctiva/sclera: Conjunctivae normal.     Pupils: Pupils are equal, round, and reactive to light.  Cardiovascular:     Rate and Rhythm: Normal rate and regular rhythm.      Pulses: Normal pulses.     Heart sounds: Normal heart sounds.  Pulmonary:     Effort: Pulmonary effort is normal.     Breath sounds: Normal breath sounds.  Abdominal:     Palpations: Abdomen is soft.     Tenderness: There is no abdominal tenderness.  Musculoskeletal:     Cervical back: No tenderness.     Lumbar back: No bony tenderness. Decreased range of motion.  Lymphadenopathy:     Cervical: No cervical adenopathy.  Skin:    General: Skin is warm and dry.  Capillary Refill: Capillary refill takes less than 2 seconds.  Neurological:     General: No focal deficit present.     Mental Status: He is alert and oriented to person, place, and time.  Psychiatric:        Mood and Affect: Mood normal.        Behavior: Behavior normal.      ASSESSMENT & PLAN: Problem List Items Addressed This Visit       Digestive   Chronic idiopathic constipation    Stable.  Takes Trulance 3 mg daily as needed Diet and nutrition discussed        Nervous and Auditory   Chronic bilateral low back pain with left-sided sciatica    Presently active and affecting quality of life. Pain management discussed. Took Celebrex with good results Advised to take Tylenol for mild to moderate pain along with Advil as needed        Genitourinary   BPH (benign prostatic hyperplasia)    Well-controlled on Flomax 0.4 mg nightly        Other   History of kidney stones    Currently asymptomatic Recommend urinalysis and bilateral kidney ultrasound      Relevant Orders   Urinalysis   US Renal   Spinal stenosis of lumbar region    Active and affecting quality of life. Most recent CT scan lumbar spine report reviewed with patient Pain management discussed      Other Visit Diagnoses     Encounter for general adult medical examination with abnormal findings    -  Primary   Relevant Orders   CBC with Differential   Comprehensive metabolic panel   Hemoglobin A1c   Lipid panel   Screening for  deficiency anemia       Relevant Orders   CBC with Differential   Screening for lipoid disorders       Relevant Orders   Lipid panel   Screening for endocrine, metabolic and immunity disorder       Relevant Orders   Comprehensive metabolic panel   Hemoglobin A1c   Urinalysis      Modifiable risk factors discussed with patient. Anticipatory guidance according to age provided. The following topics were also discussed: Social Determinants of Health Smoking.  Non-smoker Diet and nutrition Benefits of exercise Vaccinations reviewed and recommendations Cardiovascular risk assessment and need for blood work Mental health including depression and anxiety Fall and accident prevention  Patient Instructions  Health Maintenance, Male Adopting a healthy lifestyle and getting preventive care are important in promoting health and wellness. Ask your health care provider about: The right schedule for you to have regular tests and exams. Things you can do on your own to prevent diseases and keep yourself healthy. What should I know about diet, weight, and exercise? Eat a healthy diet  Eat a diet that includes plenty of vegetables, fruits, low-fat dairy products, and lean protein. Do not eat a lot of foods that are high in solid fats, added sugars, or sodium. Maintain a healthy weight Body mass index (BMI) is a measurement that can be used to identify possible weight problems. It estimates body fat based on height and weight. Your health care provider can help determine your BMI and help you achieve or maintain a healthy weight. Get regular exercise Get regular exercise. This is one of the most important things you can do for your health. Most adults should: Exercise for at least 150 minutes each week. The exercise should increase  your heart rate and make you sweat (moderate-intensity exercise). Do strengthening exercises at least twice a week. This is in addition to the moderate-intensity  exercise. Spend less time sitting. Even light physical activity can be beneficial. Watch cholesterol and blood lipids Have your blood tested for lipids and cholesterol at 82 years of age, then have this test every 5 years. You may need to have your cholesterol levels checked more often if: Your lipid or cholesterol levels are high. You are older than 83 years of age. You are at high risk for heart disease. What should I know about cancer screening? Many types of cancers can be detected early and may often be prevented. Depending on your health history and family history, you may need to have cancer screening at various ages. This may include screening for: Colorectal cancer. Prostate cancer. Skin cancer. Lung cancer. What should I know about heart disease, diabetes, and high blood pressure? Blood pressure and heart disease High blood pressure causes heart disease and increases the risk of stroke. This is more likely to develop in people who have high blood pressure readings or are overweight. Talk with your health care provider about your target blood pressure readings. Have your blood pressure checked: Every 3-5 years if you are 7-20 years of age. Every year if you are 36 years old or older. If you are between the ages of 56 and 34 and are a current or former smoker, ask your health care provider if you should have a one-time screening for abdominal aortic aneurysm (AAA). Diabetes Have regular diabetes screenings. This checks your fasting blood sugar level. Have the screening done: Once every three years after age 83 if you are at a normal weight and have a low risk for diabetes. More often and at a younger age if you are overweight or have a high risk for diabetes. What should I know about preventing infection? Hepatitis B If you have a higher risk for hepatitis B, you should be screened for this virus. Talk with your health care provider to find out if you are at risk for hepatitis B  infection. Hepatitis C Blood testing is recommended for: Everyone born from [birth year] through 1966. Anyone with known risk factors for hepatitis C. Sexually transmitted infections (STIs) You should be screened each year for STIs, including gonorrhea and chlamydia, if: You are sexually active and are younger than 83 years of age. You are older than 83 years of age and your health care provider tells you that you are at risk for this type of infection. Your sexual activity has changed since you were last screened, and you are at increased risk for chlamydia or gonorrhea. Ask your health care provider if you are at risk. Ask your health care provider about whether you are at high risk for HIV. Your health care provider may recommend a prescription medicine to help prevent HIV infection. If you choose to take medicine to prevent HIV, you should first get tested for HIV. You should then be tested every 3 months for as long as you are taking the medicine. Follow these instructions at home: Alcohol use Do not drink alcohol if your health care provider tells you not to drink. If you drink alcohol: Limit how much you have to 0-2 drinks a day. Know how much alcohol is in your drink. In the [place] one drink equals one 12 oz bottle of beer (355 mL), one 5 oz glass of wine (148 mL), or one 1 oz glass  of hard liquor (44 mL). Lifestyle Do not use any products that contain nicotine or tobacco. These products include cigarettes, chewing tobacco, and vaping devices, such as e-cigarettes. If you need help quitting, ask your health care provider. Do not use street drugs. Do not share needles. Ask your health care provider for help if you need support or information about quitting drugs. General instructions Schedule regular health, dental, and eye exams. Stay current with your vaccines. Tell your health care provider if: You often feel depressed. You have ever been abused or do not feel safe at  home. Summary Adopting a healthy lifestyle and getting preventive care are important in promoting health and wellness. Follow your health care provider's instructions about healthy diet, exercising, and getting tested or screened for diseases. Follow your health care provider's instructions on monitoring your cholesterol and blood pressure. This information is not intended to replace advice given to you by your health care provider. Make sure you discuss any questions you have with your health care provider. Document Revised: 07/18/2020 Document Reviewed: 07/18/2020 Elsevier Patient Education  2024 Elsevier Inc.     Edwina Barth, MD [facility]

## 2022-12-31 NOTE — Patient Instructions (Signed)
Health Maintenance, Male Adopting a healthy lifestyle and getting preventive care are important in promoting health and wellness. Ask your health care provider about: The right schedule for you to have regular tests and exams. Things you can do on your own to prevent diseases and keep yourself healthy. What should I know about diet, weight, and exercise? Eat a healthy diet  Eat a diet that includes plenty of vegetables, fruits, low-fat dairy products, and lean protein. Do not eat a lot of foods that are high in solid fats, added sugars, or sodium. Maintain a healthy weight Body mass index (BMI) is a measurement that can be used to identify possible weight problems. It estimates body fat based on height and weight. Your health care provider can help determine your BMI and help you achieve or maintain a healthy weight. Get regular exercise Get regular exercise. This is one of the most important things you can do for your health. Most adults should: Exercise for at least 150 minutes each week. The exercise should increase your heart rate and make you sweat (moderate-intensity exercise). Do strengthening exercises at least twice a week. This is in addition to the moderate-intensity exercise. Spend less time sitting. Even light physical activity can be beneficial. Watch cholesterol and blood lipids Have your blood tested for lipids and cholesterol at 82 years of age, then have this test every 5 years. You may need to have your cholesterol levels checked more often if: Your lipid or cholesterol levels are high. You are older than 83 years of age. You are at high risk for heart disease. What should I know about cancer screening? Many types of cancers can be detected early and may often be prevented. Depending on your health history and family history, you may need to have cancer screening at various ages. This may include screening for: Colorectal cancer. Prostate cancer. Skin cancer. Lung  cancer. What should I know about heart disease, diabetes, and high blood pressure? Blood pressure and heart disease High blood pressure causes heart disease and increases the risk of stroke. This is more likely to develop in people who have high blood pressure readings or are overweight. Talk with your health care provider about your target blood pressure readings. Have your blood pressure checked: Every 3-5 years if you are 18-39 years of age. Every year if you are 40 years old or older. If you are between the ages of 65 and 75 and are a current or former smoker, ask your health care provider if you should have a one-time screening for abdominal aortic aneurysm (AAA). Diabetes Have regular diabetes screenings. This checks your fasting blood sugar level. Have the screening done: Once every three years after age 45 if you are at a normal weight and have a low risk for diabetes. More often and at a younger age if you are overweight or have a high risk for diabetes. What should I know about preventing infection? Hepatitis B If you have a higher risk for hepatitis B, you should be screened for this virus. Talk with your health care provider to find out if you are at risk for hepatitis B infection. Hepatitis C Blood testing is recommended for: Everyone born from 1945 through 1965. Anyone with known risk factors for hepatitis C. Sexually transmitted infections (STIs) You should be screened each year for STIs, including gonorrhea and chlamydia, if: You are sexually active and are younger than 83 years of age. You are older than 83 years of age and your   health care provider tells you that you are at risk for this type of infection. Your sexual activity has changed since you were last screened, and you are at increased risk for chlamydia or gonorrhea. Ask your health care provider if you are at risk. Ask your health care provider about whether you are at high risk for HIV. Your health care provider  may recommend a prescription medicine to help prevent HIV infection. If you choose to take medicine to prevent HIV, you should first get tested for HIV. You should then be tested every 3 months for as long as you are taking the medicine. Follow these instructions at home: Alcohol use Do not drink alcohol if your health care provider tells you not to drink. If you drink alcohol: Limit how much you have to 0-2 drinks a day. Know how much alcohol is in your drink. In the [place] one drink equals one 12 oz bottle of beer (355 mL), one 5 oz glass of wine (148 mL), or one 1 oz glass of hard liquor (44 mL). Lifestyle Do not use any products that contain nicotine or tobacco. These products include cigarettes, chewing tobacco, and vaping devices, such as e-cigarettes. If you need help quitting, ask your health care provider. Do not use street drugs. Do not share needles. Ask your health care provider for help if you need support or information about quitting drugs. General instructions Schedule regular health, dental, and eye exams. Stay current with your vaccines. Tell your health care provider if: You often feel depressed. You have ever been abused or do not feel safe at home. Summary Adopting a healthy lifestyle and getting preventive care are important in promoting health and wellness. Follow your health care provider's instructions about healthy diet, exercising, and getting tested or screened for diseases. Follow your health care provider's instructions on monitoring your cholesterol and blood pressure. This information is not intended to replace advice given to you by your health care provider. Make sure you discuss any questions you have with your health care provider. Document Revised: 07/18/2020 Document Reviewed: 07/18/2020 Elsevier Patient Education  2024 Elsevier Inc.  

## 2022-12-31 NOTE — Assessment & Plan Note (Signed)
Well-controlled on Flomax 0.4 mg nightly

## 2022-12-31 NOTE — Telephone Encounter (Signed)
Levi Phillips with [facility] called states she called patient and tried to get him scheduled for his ultrasound for tomorrow but patient refused, states she has concerns since patient has history of kidney stones, and states she tried her best with the language barrier but feels patient may not understand.

## 2022-12-31 NOTE — Assessment & Plan Note (Signed)
Presently active and affecting quality of life. Pain management discussed. Took Celebrex with good results Advised to take Tylenol for mild to moderate pain along with Advil as needed

## 2022-12-31 NOTE — Assessment & Plan Note (Signed)
Active and affecting quality of life. Most recent CT scan lumbar spine report reviewed with patient Pain management discussed

## 2023-01-03 ENCOUNTER — Encounter: Payer: Self-pay | Admitting: *Deleted

## 2023-01-03 NOTE — Telephone Encounter (Signed)
Sent patient a mychart message pertaining to calling the imaging department to schedule an appt

## 2023-01-08 ENCOUNTER — Telehealth: Payer: Self-pay | Admitting: Emergency Medicine

## 2023-01-08 NOTE — Telephone Encounter (Signed)
Patient called and would like his lab results mailed to him from 12/31/22.

## 2023-01-09 NOTE — Telephone Encounter (Signed)
Lab results mailed to patient address on file

## 2023-01-14 ENCOUNTER — Telehealth: Payer: Self-pay | Admitting: Emergency Medicine

## 2023-01-14 NOTE — Telephone Encounter (Signed)
Pt is asking if his nurse to give him a call regarding his recent test results. Please advise.

## 2023-01-16 NOTE — Telephone Encounter (Signed)
Called patient in reference to test results

## 2023-03-28 ENCOUNTER — Telehealth: Payer: Self-pay

## 2023-03-28 NOTE — Telephone Encounter (Signed)
Copied from CRM [id number]. Topic: Clinical - Medical Advice >> Mar 28, 2023  1:45 PM Lars Mage H wrote: Reason for CRM: Patient has been experiencing itchiness on his back and his legs - in the past, he has used triamcinolone cream (KENALOG) 0.1 % which the provider prescribed - this does not appear to be helping - the itchiness at night is causing issues with his sleep - Is there something else that he can try?

## 2023-03-31 NOTE — Telephone Encounter (Signed)
Recommend referral to dermatology.  Thanks.

## 2023-04-01 ENCOUNTER — Other Ambulatory Visit: Payer: Self-pay | Admitting: Radiology

## 2023-04-01 DIAGNOSIS — L299 Pruritus, unspecified: Secondary | ICD-10-CM

## 2023-04-01 NOTE — Telephone Encounter (Signed)
Dermatology referral placed

## 2023-04-10 ENCOUNTER — Other Ambulatory Visit: Payer: Self-pay | Admitting: Emergency Medicine

## 2023-04-10 ENCOUNTER — Telehealth: Payer: Self-pay | Admitting: Emergency Medicine

## 2023-04-10 DIAGNOSIS — I776 Arteritis, unspecified: Secondary | ICD-10-CM

## 2023-04-10 DIAGNOSIS — L299 Pruritus, unspecified: Secondary | ICD-10-CM

## 2023-04-10 MED ORDER — TRIAMCINOLONE ACETONIDE 0.1 % EX CREA: 1.0000 | TOPICAL_CREAM | Freq: Two times a day (BID) | CUTANEOUS | 1 refills | Status: DC

## 2023-04-10 NOTE — Telephone Encounter (Signed)
Copied from CRM [id number]. Topic: Referral - Question >> Apr 10, 2023  1:42 PM Levi Phillips wrote: Reason for CRM: Patient states he received a referral for Dermatology but office doesn't have any appointments until August, patient states the Dermatologist office told him if provider can send something over stating patients visit is urgent they can get him in sooner. Please reach out to patient, thank you.   [name] [phone]

## 2023-04-10 NOTE — Telephone Encounter (Signed)
Copied from CRM [id number]. Topic: Clinical - Medication Refill >> Apr 10, 2023  1:20 PM Alvino Blood C wrote: Most Recent Primary Care Visit:  Provider: Georgina Quint  Department: [facility]  Visit Type: PHYSICAL  Date: 12/31/2022  Medication: triamcinolone cream (KENALOG) 0.1 %  Has the patient contacted their pharmacy? Yes (Agent: If no, request that the patient contact the pharmacy for the refill. If patient does not wish to contact the pharmacy document the reason why and proceed with request.) (Agent: If yes, when and what did the pharmacy advise?)  Is this the correct pharmacy for this prescription? Yes If no, delete pharmacy and type the correct one.  This is the patient's preferred pharmacy:  [facility] [address], [place] - [address] [place] [address] Phone: [phone] Fax: [phone]   Has the prescription been filled recently? No  Is the patient out of the medication? Yes  Has the patient been seen for an appointment in the last year OR does the patient have an upcoming appointment? Yes  Can we respond through MyChart? No  Agent: Please be advised that Rx refills may take up to 3 business days. We ask that you follow-up with your pharmacy.

## 2023-05-08 ENCOUNTER — Ambulatory Visit (INDEPENDENT_AMBULATORY_CARE_PROVIDER_SITE_OTHER): Payer: 59 | Admitting: Emergency Medicine

## 2023-05-08 ENCOUNTER — Encounter: Payer: Self-pay | Admitting: Emergency Medicine

## 2023-05-08 VITALS — BP 110/70 | HR 70 | Temp 97.8°F | Ht 68.5 in | Wt 186.0 lb

## 2023-05-08 DIAGNOSIS — L299 Pruritus, unspecified: Secondary | ICD-10-CM

## 2023-05-08 LAB — CBC WITH DIFFERENTIAL/PLATELET
Basophils Absolute: 0 10*3/uL (ref 0.0–0.1)
Basophils Relative: 0.5 % (ref 0.0–3.0)
Eosinophils Absolute: 0.7 10*3/uL (ref 0.0–0.7)
Eosinophils Relative: 7.4 % — ABNORMAL HIGH (ref 0.0–5.0)
HCT: 43.7 % (ref 39.0–52.0)
Hemoglobin: 14.6 g/dL (ref 13.0–17.0)
Lymphocytes Relative: 25.3 % (ref 12.0–46.0)
Lymphs Abs: 2.3 10*3/uL (ref 0.7–4.0)
MCHC: 33.4 g/dL (ref 30.0–36.0)
MCV: 88.2 fl (ref 78.0–100.0)
Monocytes Absolute: 0.8 10*3/uL (ref 0.1–1.0)
Monocytes Relative: 9.2 % (ref 3.0–12.0)
Neutro Abs: 5.1 10*3/uL (ref 1.4–7.7)
Neutrophils Relative %: 57.6 % (ref 43.0–77.0)
Platelets: 204 10*3/uL (ref 150.0–400.0)
RBC: 4.95 Mil/uL (ref 4.22–5.81)
RDW: 14 % (ref 11.5–15.5)
WBC: 8.9 10*3/uL (ref 4.0–10.5)

## 2023-05-08 LAB — COMPREHENSIVE METABOLIC PANEL
ALT: 21 U/L (ref 0–53)
AST: 21 U/L (ref 0–37)
Albumin: 4.3 g/dL (ref 3.5–5.2)
Alkaline Phosphatase: 61 U/L (ref 39–117)
BUN: 15 mg/dL (ref 6–23)
CO2: 29 meq/L (ref 19–32)
Calcium: 9.4 mg/dL (ref 8.4–10.5)
Chloride: 97 meq/L (ref 96–112)
Creatinine, Ser: 0.95 mg/dL (ref 0.40–1.50)
GFR: 74.16 mL/min (ref >60.00)
Glucose, Bld: 79 mg/dL (ref 70–99)
Potassium: 4.1 meq/L (ref 3.5–5.1)
Sodium: 135 meq/L (ref 135–145)
Total Bilirubin: 0.7 mg/dL (ref 0.2–1.2)
Total Protein: 8 g/dL (ref 6.0–8.3)

## 2023-05-08 MED ORDER — CETIRIZINE HCL 10 MG PO TABS: 10.0000 mg | ORAL_TABLET | Freq: Every day | ORAL | 11 refills | Status: DC

## 2023-05-08 MED ORDER — PREDNISONE 20 MG PO TABS: 20.0000 mg | ORAL_TABLET | Freq: Every day | ORAL | 0 refills | Status: AC

## 2023-05-08 NOTE — Patient Instructions (Signed)
 Pruritus Pruritus is an itchy feeling on the skin. One of the most common causes is dry skin, but many different things can cause itching. Most cases of itching do not require medical attention. Sometimes itchy skin can turn into a rash or a secondary infection. Follow these instructions at home: Skin care  Do not use scented soaps, detergents, perfumes, and cosmetic products. Instead, use gentle, unscented versions of these items. Apply moisturizing creams to your skin frequently, at least twice daily. Apply immediately after bathing while skin is still wet. Take medicines or apply medicated creams only as told by your health care provider. This may include: Corticosteroid cream or topical calcineurin inhibitor. Anti-itch lotions containing urea, camphor, or menthol. Oral antihistamines. Do not take hot showers or baths, which can make itching worse. A short, cool shower may help with itching as long as you apply moisturizing lotion after the shower. Apply a cool, wet cloth (cool compress) to the affected areas. You may take lukewarm baths with one of the following: Epsom salts. You can get these at your local pharmacy or grocery store. Follow the instructions on the packaging. Baking soda. Pour a small amount into the bath as told by your health care provider. Colloidal oatmeal. You can get this at your local pharmacy or grocery store. Follow the instructions on the packaging. Do not scratch your skin. General instructions Avoid wearing tight clothes. Keep a journal to help find out what is causing your itching. Write down: What you eat and drink. What cosmetic products you use. What soaps or detergents you use. What you wear, including jewelry. Use a humidifier. This keeps the air moist, which helps to prevent dry skin. Be aware of any changes in your itchiness. Tell your health care provider about any changes. Contact a health care provider if: The itching does not go away after  several days. You notice redness, warmth, or drainage on the skin where you have scratched. You are unusually thirsty or urinating more than normal. Your skin tingles or feels numb. Your skin or the white parts of your eyes turn yellow (jaundice). You feel weak. You have any of the following: Night sweats. Tiredness (fatigue). Weight loss. Abdominal pain. Summary Pruritus is an itchy feeling on the skin. One of the most common causes is dry skin, but many different conditions and factors can cause itching. Apply moisturizing creams to your skin frequently, at least twice daily. Apply immediately after bathing while skin is still wet. Take medicines or apply medicated creams only as told by your health care provider. Do not take hot showers or baths. Do not use scented soaps, detergents, perfumes, or cosmetic products. Keep a journal to help find out what is causing your itching. This information is not intended to replace advice given to you by your health care provider. Make sure you discuss any questions you have with your health care provider. Document Revised: 04/05/2021 Document Reviewed: 04/05/2021 Elsevier Patient Education  2024 [employer].

## 2023-05-08 NOTE — Assessment & Plan Note (Signed)
 Clinically stable.  No red flag signs or symptoms. Differential diagnosis discussed with patient Recommend blood work today including liver enzymes and antimitochondrial antibodies Recommend allergist evaluation.  Referral placed today. Symptoms could be related to hot water bath reaction.  Transient vasodilatation leading to generalized itching. Recommend to start daily cetirizine 10 mg May benefit from low-dose prednisone 20 mg for 5 to 7 days ED precautions given Advised to contact the office if no better or worse during the next couple of weeks.

## 2023-05-08 NOTE — Progress Notes (Signed)
 Levi Phillips 83 y.o.   Chief Complaint  Patient presents with   itching     Patient states he's been itchy all over for awhile. He states the triamcinolone cream only helps for an hour and would like something else     HISTORY OF PRESENT ILLNESS: This is a 83 y.o. male complaining of generalized itching unresponsive to topical corticosteroid creams on and off for the past several weeks Was referred to dermatology but unable to see him until next August Of note is the fact that patient takes hot baths 2-3 times per day and he gets worse 1 to 2 hours later No other systemic symptoms No other complaints or medical concerns today.   HPI   Prior to Admission medications   Medication Sig Start Date End Date Taking? Authorizing Provider  allopurinol (ZYLOPRIM) 100 MG tablet Take 1 tablet (100 mg total) by mouth daily. 01/11/22  Yes Jillann Charette, Eilleen Kempf, MD  cetirizine (ZYRTEC) 10 MG tablet Take 1 tablet (10 mg total) by mouth daily. 05/08/23  Yes Madyn Ivins, Eilleen Kempf, MD  predniSONE (DELTASONE) 20 MG tablet Take 1 tablet (20 mg total) by mouth daily with breakfast for 7 days. 05/08/23 05/15/23 Yes Letzy Gullickson, Eilleen Kempf, MD  tamsulosin (FLOMAX) 0.4 MG CAPS capsule Take 1 capsule (0.4 mg total) by mouth daily. 07/05/21  Yes Brody Bonneau, Eilleen Kempf, MD  clobetasol cream (TEMOVATE) 0.05 % Apply 1 Application topically 2 (two) times daily. Patient not taking: Reported on 05/08/2023 05/07/22   Georgina Quint, MD  ibuprofen (ADVIL) 600 MG tablet Take 1 tablet (600 mg total) by mouth every 6 (six) hours as needed for fever, mild pain or moderate pain. Patient not taking: Reported on 05/08/2023 11/08/21   Edwin Dada P, DO  methocarbamol (ROBAXIN) 500 MG tablet Take 1 tablet (500 mg total) by mouth every 8 (eight) hours as needed (back pain, spasms). Patient not taking: Reported on 05/08/2023 06/05/22   London Sheer, MD  Plecanatide (TRULANCE) 3 MG TABS Take 1 tablet (3 mg total) by mouth  daily. Patient not taking: Reported on 05/08/2023 05/07/22   Georgina Quint, MD  triamcinolone cream (KENALOG) 0.1 % Apply 1 Application topically 2 (two) times daily. 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   History of vertigo 05/09/2017   History of kidney stones 04/10/2017   DDD (degenerative disc disease), lumbar 01/09/2017   Gout 01/09/2017   BPH (benign prostatic hyperplasia) 01/09/2017   BMI 27.0-27.9,adult 01/09/2017    Past Medical History:  Diagnosis Date   Allergy    Anemia    Arthritis    Back pain    spondylolisthesis   Enlarged prostate    takes Flomax daily   Gout    takes Allopurinol daily    Past Surgical History:  Procedure Laterality Date   cataract surgery Bilateral    EYE SURGERY     LITHOTRIPSY     done in [place]    Social History   Socioeconomic History   Marital status: Married    Spouse name: Not on file   Number of children: 3   Years of education:  Not on file   Highest education level: Not on file  Occupational History   Occupation: Retired  Tobacco Use   Smoking status: Never   Smokeless tobacco: Never  Vaping Use   Vaping status: Never Used  Substance and Sexual Activity   Alcohol use: Yes    Comment: occ beer   Drug use: No   Sexual activity: Not on file  Other Topics Concern   Not on file  Social History Narrative   ** Merged History Encounter **       Social Drivers of Health   Financial Resource Strain: Medium Risk (10/18/2022)   Overall Financial Resource Strain (CARDIA)    Difficulty of Paying Living Expenses: Somewhat hard  Food Insecurity:  No Food Insecurity (10/18/2022)   Hunger Vital Sign    Worried About Running Out of Food in the Last Year: Never true    Ran Out of Food in the Last Year: Never true  Transportation Needs: No Transportation Needs (10/18/2022)   PRAPARE - Administrator, Civil Service (Medical): No    Lack of Transportation (Non-Medical): No  Physical Activity: Sufficiently Active (10/18/2022)   Exercise Vital Sign    Days of Exercise per Week: 7 days    Minutes of Exercise per Session: 40 min  Stress: Not on file  Social Connections: Moderately Isolated (10/18/2022)   Social Connection and Isolation Panel [NHANES]    Frequency of Communication with Friends and Family: More than three times a week    Frequency of Social Gatherings with Friends and Family: Once a week    Attends Religious Services: Never    Database administrator or Organizations: No    Attends Banker Meetings: Never    Marital Status: Married  Catering manager Violence: Not At Risk (10/18/2022)   Humiliation, Afraid, Rape, and Kick questionnaire    Fear of Current or Ex-Partner: No    Emotionally Abused: No    Physically Abused: No    Sexually Abused: No    Family History  Problem Relation Age of Onset   Cancer Mother    Alzheimer's disease Sister    Alzheimer's disease Sister      Review of Systems  Constitutional: Negative.  Negative for chills and fever.  HENT:  Negative for congestion and sore throat.   Respiratory: Negative.  Negative for cough and shortness of breath.   Cardiovascular: Negative.  Negative for chest pain and palpitations.  Gastrointestinal:  Negative for abdominal pain, diarrhea, nausea and vomiting.  Genitourinary: Negative.  Negative for dysuria and hematuria.  Skin:  Positive for itching and rash.  Neurological: Negative.  Negative for dizziness and headaches.  All other systems reviewed and are negative.   Vitals:   05/08/23 0833  BP: 110/70  Pulse: 70  Temp: 97.8 F (36.6  C)  SpO2: 95%    Physical Exam Vitals reviewed.  Constitutional:      Appearance: Normal appearance.  HENT:     Head: Normocephalic.     Mouth/Throat:     Mouth: Mucous membranes are moist.     Pharynx: Oropharynx is clear.  Eyes:     Extraocular Movements: Extraocular movements intact.     Pupils: Pupils are equal, round, and reactive to light.  Cardiovascular:     Rate and Rhythm: Normal rate and regular rhythm.     Pulses: Normal pulses.     Heart sounds: Normal heart sounds.  Pulmonary:     Effort: Pulmonary  effort is normal.     Breath sounds: Normal breath sounds.  Abdominal:     Palpations: Abdomen is soft.     Tenderness: There is no abdominal tenderness.  Musculoskeletal:     Cervical back: No tenderness.  Lymphadenopathy:     Cervical: No cervical adenopathy.  Skin:    General: Skin is warm and dry.  Neurological:     Mental Status: He is alert and oriented to person, place, and time.  Psychiatric:        Mood and Affect: Mood normal.        Behavior: Behavior normal.      ASSESSMENT & PLAN: A total of 34 minutes was spent with the patient and counseling/coordination of care regarding preparing for this visit, review of most recent office visit notes, review of multiple chronic medical conditions and their management, differential diagnosis of generalized skin itching and need for evaluation by allergist, review of all medications and changes made, review of most recent bloodwork results, review of health maintenance items, education on nutrition, prognosis, documentation, and need for follow up.  Problem List Items Addressed This Visit       Musculoskeletal and Integument   Pruritus - Primary   Clinically stable.  No red flag signs or symptoms. Differential diagnosis discussed with patient Recommend blood work today including liver enzymes and antimitochondrial antibodies Recommend allergist evaluation.  Referral placed today. Symptoms could be related  to hot water bath reaction.  Transient vasodilatation leading to generalized itching. Recommend to start daily cetirizine 10 mg May benefit from low-dose prednisone 20 mg for 5 to 7 days ED precautions given Advised to contact the office if no better or worse during the next couple of weeks.      Relevant Medications   predniSONE (DELTASONE) 20 MG tablet   cetirizine (ZYRTEC) 10 MG tablet   Other Relevant Orders   Mitochondrial Antibodies   Comprehensive metabolic panel   CBC with Differential/Platelet   Ambulatory referral to Allergy   Patient Instructions  Pruritus Pruritus is an itchy feeling on the skin. One of the most common causes is dry skin, but many different things can cause itching. Most cases of itching do not require medical attention. Sometimes itchy skin can turn into a rash or a secondary infection. Follow these instructions at home: Skin care  Do not use scented soaps, detergents, perfumes, and cosmetic products. Instead, use gentle, unscented versions of these items. Apply moisturizing creams to your skin frequently, at least twice daily. Apply immediately after bathing while skin is still wet. Take medicines or apply medicated creams only as told by your health care provider. This may include: Corticosteroid cream or topical calcineurin inhibitor. Anti-itch lotions containing urea, camphor, or menthol. Oral antihistamines. Do not take hot showers or baths, which can make itching worse. A short, cool shower may help with itching as long as you apply moisturizing lotion after the shower. Apply a cool, wet cloth (cool compress) to the affected areas. You may take lukewarm baths with one of the following: Epsom salts. You can get these at your local pharmacy or grocery store. Follow the instructions on the packaging. Baking soda. Pour a small amount into the bath as told by your health care provider. Colloidal oatmeal. You can get this at your local pharmacy or  grocery store. Follow the instructions on the packaging. Do not scratch your skin. General instructions Avoid wearing tight clothes. Keep a journal to help find out what is causing your  itching. Write down: What you eat and drink. What cosmetic products you use. What soaps or detergents you use. What you wear, including jewelry. Use a humidifier. This keeps the air moist, which helps to prevent dry skin. Be aware of any changes in your itchiness. Tell your health care provider about any changes. Contact a health care provider if: The itching does not go away after several days. You notice redness, warmth, or drainage on the skin where you have scratched. You are unusually thirsty or urinating more than normal. Your skin tingles or feels numb. Your skin or the white parts of your eyes turn yellow (jaundice). You feel weak. You have any of the following: Night sweats. Tiredness (fatigue). Weight loss. Abdominal pain. Summary Pruritus is an itchy feeling on the skin. One of the most common causes is dry skin, but many different conditions and factors can cause itching. Apply moisturizing creams to your skin frequently, at least twice daily. Apply immediately after bathing while skin is still wet. Take medicines or apply medicated creams only as told by your health care provider. Do not take hot showers or baths. Do not use scented soaps, detergents, perfumes, or cosmetic products. Keep a journal to help find out what is causing your itching. This information is not intended to replace advice given to you by your health care provider. Make sure you discuss any questions you have with your health care provider. Document Revised: 04/05/2021 Document Reviewed: 04/05/2021 Elsevier Patient Education  2024 [employer]     Edwina Barth, MD [facility]

## 2023-05-10 LAB — MITOCHONDRIAL ANTIBODIES: Mitochondrial M2 Ab, IgG: <=20 U (ref <=20.0)

## 2023-06-04 ENCOUNTER — Other Ambulatory Visit: Payer: Self-pay | Admitting: Emergency Medicine

## 2023-06-04 DIAGNOSIS — I776 Arteritis, unspecified: Secondary | ICD-10-CM

## 2023-06-04 DIAGNOSIS — L299 Pruritus, unspecified: Secondary | ICD-10-CM

## 2023-06-04 NOTE — Telephone Encounter (Signed)
 Copied from CRM [id number]. Topic: Clinical - Medication Refill >> Jun 04, 2023 10:51 AM Almira Coaster wrote: Most Recent Primary Care Visit:  Provider: Georgina Quint  Department: [facility]  Visit Type: OFFICE VISIT  Date: 05/08/2023  Medication: triamcinolone cream (KENALOG) 0.1 %  Has the patient contacted their pharmacy? Yes (Agent: If no, request that the patient contact the pharmacy for the refill. If patient does not wish to contact the pharmacy document the reason why and proceed with request.) (Agent: If yes, when and what did the pharmacy advise?)  Is this the correct pharmacy for this prescription? Yes If no, delete pharmacy and type the correct one.  This is the patient's preferred pharmacy:  [facility] [address] [place] - [address] [place] [address] Phone: [phone] Fax: [phone]   Has the prescription been filled recently? No  Is the patient out of the medication? Yes  Has the patient been seen for an appointment in the last year OR does the patient have an upcoming appointment? Yes  Can we respond through MyChart? Yes  Agent: Please be advised that Rx refills may take up to 3 business days. We ask that you follow-up with your pharmacy.

## 2023-06-05 ENCOUNTER — Telehealth: Payer: Self-pay | Admitting: Emergency Medicine

## 2023-06-05 ENCOUNTER — Encounter: Payer: Self-pay | Admitting: Radiology

## 2023-06-05 ENCOUNTER — Other Ambulatory Visit: Payer: Self-pay | Admitting: Emergency Medicine

## 2023-06-05 DIAGNOSIS — I776 Arteritis, unspecified: Secondary | ICD-10-CM

## 2023-06-05 DIAGNOSIS — L299 Pruritus, unspecified: Secondary | ICD-10-CM

## 2023-06-05 MED ORDER — TRIAMCINOLONE ACETONIDE 0.1 % EX CREA: 1.0000 | TOPICAL_CREAM | Freq: Two times a day (BID) | CUTANEOUS | 1 refills | Status: DC

## 2023-06-05 NOTE — Telephone Encounter (Signed)
 New prescription sent

## 2023-06-05 NOTE — Telephone Encounter (Signed)
 Copied from CRM [id number]. Topic: Clinical - Prescription Issue >> Jun 05, 2023 11:08 AM Alcus Dad wrote: Reason for CRM: Patient stated that when he went to pick up his medication triamcinolone cream (KENALOG) 0.1 % it was the small one. Patient states that he need the bigger bottle of cream if possible

## 2023-06-06 NOTE — Telephone Encounter (Signed)
 Spoke with patient and informed him its at the [facility]. He understood and will go pick this up

## 2023-06-06 NOTE — Telephone Encounter (Signed)
 Pt called pharmacy and they stated they have not received the new RX yet.   Please re-send RX

## 2023-07-11 ENCOUNTER — Telehealth: Payer: Self-pay

## 2023-07-11 NOTE — Telephone Encounter (Signed)
 Copied from CRM [id number]. Topic: Clinical - Medication Question >> Jul 10, 2023  4:17 PM Bridgette Campus T wrote: Reason for CRM: Medications are not working long enough for the itching- may need medication change or referral - [phone]

## 2023-07-11 NOTE — Telephone Encounter (Signed)
 Refer to allergy clinic.  Thanks.

## 2023-07-12 ENCOUNTER — Other Ambulatory Visit: Payer: Self-pay | Admitting: Radiology

## 2023-07-12 DIAGNOSIS — L299 Pruritus, unspecified: Secondary | ICD-10-CM

## 2023-07-12 NOTE — Telephone Encounter (Signed)
 Referral placed / Patient has been informed

## 2023-07-16 ENCOUNTER — Encounter: Payer: Self-pay | Admitting: Allergy and Immunology

## 2023-07-16 ENCOUNTER — Ambulatory Visit (INDEPENDENT_AMBULATORY_CARE_PROVIDER_SITE_OTHER): Admitting: Allergy and Immunology

## 2023-07-16 VITALS — BP 130/62 | HR 76 | Temp 98.0°F | Resp 16 | Ht 66.0 in | Wt 181.3 lb

## 2023-07-16 DIAGNOSIS — D7219 Other eosinophilia: Secondary | ICD-10-CM

## 2023-07-16 DIAGNOSIS — L989 Disorder of the skin and subcutaneous tissue, unspecified: Secondary | ICD-10-CM | POA: Diagnosis not present

## 2023-07-16 MED ORDER — MOMETASONE FUROATE 0.1 % EX CREA: 1.0000 | TOPICAL_CREAM | Freq: Two times a day (BID) | CUTANEOUS | 0 refills | Status: DC

## 2023-07-16 MED ORDER — IVERMECTIN 3 MG PO TABS: ORAL_TABLET | ORAL | 0 refills | Status: AC

## 2023-07-16 MED ORDER — METHYLPREDNISOLONE ACETATE 80 MG/ML IJ SUSP
80.0000 mg | Freq: Once | INTRAMUSCULAR | Status: AC
  Administered 2023-07-16: 80 mg via INTRAMUSCULAR

## 2023-07-16 MED ORDER — TACROLIMUS 0.1 % EX OINT: TOPICAL_OINTMENT | Freq: Two times a day (BID) | CUTANEOUS | 0 refills | Status: DC

## 2023-07-16 NOTE — Patient Instructions (Addendum)
  1. Depomedrol 80 IM delivered in clinic today  2. Ivermectin 3 mg - 5 tablets today and repeat in 10 days  3. Elimite topical treatment today and repeat in 10 days  4. Can increase cetirizine  to 1-2 tablets 1-2 times per day (MAX=40mg /day). Sedation???  5. Can add mometasone 0.1% cream + tacrolimus 0.1% ointment 2 times per day  6. Blood - CBC w/d, celiac screen w/IgA  7. Further treatment. Yes, if not responding to treatment  8. Return to clinic in 2 weeks.

## 2023-07-16 NOTE — Progress Notes (Unsigned)
 [place] - [place] - [place] - [place] - [place]   Dear Vedia Geralds,  Thank you for referring Levi Phillips to the [facility] [place]  on 07/16/2023.   Below is a summation of this patient's evaluation and recommendations.  Thank you for your referral. I will keep you informed about this patient's response to treatment.   If you have any questions please do not hesitate to contact me.   Sincerely,  Fabienne Holter, MD Allergy / Immunology [facility] [place]    ______________________________________________________________________    NEW PATIENT NOTE  Referring Provider: Elvira Hammersmith, * Primary Provider: Elvira Hammersmith, MD Date of office visit: 07/16/2023    Subjective:   Chief Complaint:  Levi Phillips (DOB: 01/30/1941) is a 83 y.o. male who presents to the clinic on 07/16/2023 with a chief complaint of Urticaria (Itching and hives started about a year ago. He stated that they do hurt.  They get worse at night. ) .     HPI: Levi Phillips presents to this clinic in evaluation of dermatitis.  We are communicating via a Doctor, general practice.  Apparently over the course of the past month or past 2 months he has been having an itchy dermatitis.  This has been progressive initially starting a small spots on his thorax and then quickly migrating to his extremities and especially his groin area.  It is intensely itchy and he has a hard time not excoriating these areas.  He apparently had some type of dermatitis involving his shins last year but that was treated with a topical agent and that appeared to resolve within a month or so.  This most recent dermatitis has been treated with topical agents and cetirizine  and that has done absolutely nothing.  He has no associated systemic or constitutional symptoms with this dermatitis.  There is not really an obvious provoking factor giving  rise to this dermatitis.  He has not started any new supplements or health foods and he has not really had a significant change in his environment and he is not had any symptoms suggesting ongoing infectious disease.  He had some blood test performed which were apparently normal..  Past Medical History:  Diagnosis Date   Allergy    Anemia    Arthritis    Back pain    spondylolisthesis   Enlarged prostate    takes Flomax  daily   Gout    takes Allopurinol  daily    Past Surgical History:  Procedure Laterality Date   cataract surgery Bilateral    EYE SURGERY     LITHOTRIPSY     done in [place]    Allergies as of 07/16/2023   No Known Allergies      Medication List    allopurinol  100 MG tablet Commonly known as: ZYLOPRIM  Take 1 tablet (100 mg total) by mouth daily.   cetirizine  10 MG tablet Commonly known as: ZYRTEC  Take 1 tablet (10 mg total) by mouth daily.   ivermectin 3 MG Tabs tablet Commonly known as: STROMECTOL Take 5 pills today then repeat in ten days then stop. Started by: Leobardo Granlund J Giovana Faciane   mometasone 0.1 % cream Commonly known as: ELOCON Apply 1 Application topically in the morning and at bedtime. Started by: Ludmilla Mcgillis J Shanaya Schneck   tacrolimus 0.1 % ointment Commonly known as: PROTOPIC Apply topically 2 (two) times daily. Started by: Malala Trenkamp J Emilie Carp   triamcinolone  cream 0.1 % Commonly known as: KENALOG   Apply 1 Application topically 2 (two) times daily.    Review of systems negative except as noted in HPI / PMHx or noted below:  Review of Systems  Constitutional: Negative.   HENT: Negative.    Eyes: Negative.   Respiratory: Negative.    Cardiovascular: Negative.   Gastrointestinal: Negative.   Genitourinary: Negative.   Musculoskeletal: Negative.   Skin: Negative.   Neurological: Negative.   Endo/Heme/Allergies: Negative.   Psychiatric/Behavioral: Negative.      Family History  Problem Relation Age of Onset   Cancer Mother    Alzheimer's  disease Sister    Alzheimer's disease Sister     Social History   Socioeconomic History   Marital status: Married    Spouse name: Not on file   Number of children: 3   Years of education: Not on file   Highest education level: Not on file  Occupational History   Occupation: Retired  Tobacco Use   Smoking status: Never   Smokeless tobacco: Never  Vaping Use   Vaping status: Never Used  Substance and Sexual Activity   Alcohol  use: Not Currently    Comment: occ beer   Drug use: No   Sexual activity: Not on file  Other Topics Concern   Not on file  Social History Narrative   ** Merged History Encounter **       Social Drivers of Health   Financial Resource Strain: Medium Risk (10/18/2022)   Overall Financial Resource Strain (CARDIA)    Difficulty of Paying Living Expenses: Somewhat hard  Food Insecurity: No Food Insecurity (10/18/2022)   Hunger Vital Sign    Worried About Running Out of Food in the Last Year: Never true    Ran Out of Food in the Last Year: Never true  Transportation Needs: No Transportation Needs (10/18/2022)   PRAPARE - Administrator, Civil Service (Medical): No    Lack of Transportation (Non-Medical): No  Physical Activity: Sufficiently Active (10/18/2022)   Exercise Vital Sign    Days of Exercise per Week: 7 days    Minutes of Exercise per Session: 40 min  Stress: Not on file  Social Connections: Moderately Isolated (10/18/2022)   Social Connection and Isolation Panel [NHANES]    Frequency of Communication with Friends and Family: More than three times a week    Frequency of Social Gatherings with Friends and Family: Once a week    Attends Religious Services: Never    Database administrator or Organizations: No    Attends Banker Meetings: Never    Marital Status: Married  Catering manager Violence: Not At Risk (10/18/2022)   Humiliation, Afraid, Rape, and Kick questionnaire    Fear of Current or Ex-Partner: No    Emotionally  Abused: No    Physically Abused: No    Sexually Abused: No    Environmental and Social history   Objective:   Vitals:   07/16/23 1418  BP: 130/62  Pulse: 76  Resp: 16  Temp: 98 F (36.7 C)  SpO2: 96%   Height: 5\' 6"  (167.6 cm) Weight: 181 lb 4.8 oz (82.2 kg)  Physical Exam Constitutional:      Appearance: He is not diaphoretic.  HENT:     Head: Normocephalic.     Right Ear: Tympanic membrane, ear canal and external ear normal.     Left Ear: Tympanic membrane, ear canal and external ear normal.     Nose: Nose normal. No mucosal edema or  rhinorrhea.     Mouth/Throat:     Pharynx: Uvula midline. No oropharyngeal exudate.  Eyes:     Conjunctiva/sclera: Conjunctivae normal.  Neck:     Thyroid : No thyromegaly.     Trachea: Trachea normal. No tracheal tenderness or tracheal deviation.  Cardiovascular:     Rate and Rhythm: Normal rate and regular rhythm.     Heart sounds: Normal heart sounds, S1 normal and S2 normal. No murmur heard. Pulmonary:     Effort: No respiratory distress.     Breath sounds: Normal breath sounds. No stridor. No wheezing or rales.  Lymphadenopathy:     Head:     Right side of head: No tonsillar adenopathy.     Left side of head: No tonsillar adenopathy.     Cervical: No cervical adenopathy.  Skin:    Findings: Rash (Multiple erythematous slightly blanching patches across upper extremities, torso, and extensive involvement of his groin bilaterally.  Predominantly a symmetrical outbreak.  Evidence of excoriation and suggestion of linear burrows.) present. No erythema.     Nails: There is no clubbing.  Neurological:     Mental Status: He is alert.     Diagnostics: Allergy skin tests were not performed.   Results of blood test obtained 08 May 2023 identifies creatinine 0.95 mg/DL, AST 40J/W, ALT 11B/J, WBC 8.9, absolute eosinophil 700, absolute lymphocyte 2300, hemoglobin 14.6, platelet 204.   Assessment and Plan:    1. Inflammatory  dermatosis   2. Other eosinophilia    1. Depomedrol 80 IM delivered in clinic today  2. Ivermectin 3 mg - 5 tablets today and repeat in 10 days  3. Elimite topical treatment today and repeat in 10 days  4. Can increase cetirizine  to 1-2 tablets 1-2 times per day (MAX=40mg /day). Sedation???  5. Can add mometasone 0.1% cream + tacrolimus 0.1% ointment 2 times per day  6. Blood - CBC w/d, celiac screen w/IgA  7. Further treatment. Yes, if not responding to treatment  8. Return to clinic in 2 weeks.  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  We will recheck his eosinophil count and also screen him for possible dermatitis herpetiformis with the blood test noted above.  I will see him back in this clinic in 2 weeks.  Fabienne Holter, MD Allergy / Immunology [facility] [place] 

## 2023-07-17 ENCOUNTER — Telehealth: Payer: Self-pay | Admitting: Allergy and Immunology

## 2023-07-17 ENCOUNTER — Encounter: Payer: Self-pay | Admitting: Allergy and Immunology

## 2023-07-17 ENCOUNTER — Other Ambulatory Visit (HOSPITAL_COMMUNITY): Payer: Self-pay

## 2023-07-17 ENCOUNTER — Telehealth: Payer: Self-pay

## 2023-07-17 NOTE — Telephone Encounter (Signed)
 Pt called to advise rx is not covered by insurance and is hoping that Dr can give something else - Pt asked to be called back at [phone] when new rx made

## 2023-07-17 NOTE — Telephone Encounter (Signed)
 Pharmacy Patient Advocate Encounter  Received notification from [employer] that Prior Authorization for Ivermectin 3MG  tablets  has been APPROVED from 07/17/2023 to 08/17/2023. Ran test claim, Copay is $0.00. This test claim was processed through [facility]- copay amounts may vary at other pharmacies due to pharmacy/plan contracts, or as the patient moves through the different stages of their insurance plan.   PA #/Case ID/Reference #: Levi Phillips

## 2023-07-18 LAB — CBC WITH DIFFERENTIAL/PLATELET
Basophils Absolute: 0.1 10*3/uL (ref 0.0–0.2)
Basos: 1 %
EOS (ABSOLUTE): 0.8 10*3/uL — ABNORMAL HIGH (ref 0.0–0.4)
Eos: 9 %
Hematocrit: 45.1 % (ref 37.5–51.0)
Hemoglobin: 15 g/dL (ref 13.0–17.7)
Immature Grans (Abs): 0.1 10*3/uL (ref 0.0–0.1)
Immature Granulocytes: 1 %
Lymphocytes Absolute: 2.3 10*3/uL (ref 0.7–3.1)
Lymphs: 26 %
MCH: 29.2 pg (ref 26.6–33.0)
MCHC: 33.3 g/dL (ref 31.5–35.7)
MCV: 88 fL (ref 79–97)
Monocytes Absolute: 0.7 10*3/uL (ref 0.1–0.9)
Monocytes: 8 %
Neutrophils Absolute: 5.1 10*3/uL (ref 1.4–7.0)
Neutrophils: 55 %
Platelets: 229 10*3/uL (ref 150–450)
RBC: 5.13 x10E6/uL (ref 4.14–5.80)
RDW: 13.1 % (ref 11.6–15.4)
WBC: 9.1 10*3/uL (ref 3.4–10.8)

## 2023-07-18 LAB — CELIAC DISEASE COMPREHENSIVE PANEL W REFLEXES, INFANT
Antigliadin Abs, IgA: 3 U (ref 0–19)
IgA/Immunoglobulin A, Serum: 159 mg/dL (ref 61–437)

## 2023-07-19 ENCOUNTER — Telehealth: Payer: Self-pay

## 2023-07-19 ENCOUNTER — Other Ambulatory Visit (HOSPITAL_COMMUNITY): Payer: Self-pay

## 2023-07-19 NOTE — Telephone Encounter (Signed)
*  Asthma/Allergy  Pharmacy Patient Advocate Encounter   Received notification from Fax that prior authorization for Tacrolimus  0.1% is required/requested.   Insurance verification completed.   The patient is insured through [facility] .   Per test claim:  HYDROCORTISONE OIN;BETAMETHASONE AUGMENT is preferred by the insurance.  If suggested medication is appropriate, Please send in a new RX and discontinue this one. If not, please advise as to why it's not appropriate so that we may request a Prior Authorization. Please note, some preferred medications may still require a PA.  If the suggested medications have not been trialed and there are no contraindications to their use, the PA will not be submitted, as it will not be approved.

## 2023-07-19 NOTE — Telephone Encounter (Signed)
 Spoke with the patient--DOB verfied--informed me that his protopic  would be too expensive for him and would like another option. Will forward to Dr. Kozlow for further advise.

## 2023-07-22 ENCOUNTER — Other Ambulatory Visit (HOSPITAL_COMMUNITY): Payer: Self-pay

## 2023-07-22 NOTE — Telephone Encounter (Signed)
 Does the patient have a history of failure or intolerance to ANY TWO of the following formulary topical agents: (1) Desonide ointment, (2) Ala-Cort 2.5% or hydrocortisone 2.5% cream, (3) Hydrocortisone 2.5% ointment, (4) Generic augmented betamethasone 0.05%, (5) Fluocinonide 0.05%?

## 2023-07-22 NOTE — Telephone Encounter (Signed)
 Same questions and requirements for Elidel.

## 2023-07-22 NOTE — Telephone Encounter (Signed)
 CMM key for Tacrolimus  0.1% request: [id number]

## 2023-07-25 ENCOUNTER — Ambulatory Visit: Payer: Self-pay

## 2023-07-26 NOTE — Telephone Encounter (Signed)
 Does the patient have a history of failure or intolerance to ANY TWO of the following formulary topical agents: (1) Desonide ointment, (2) Ala-Cort 2.5% or hydrocortisone 2.5% cream, (3) Hydrocortisone 2.5% ointment, (4) Generic augmented betamethasone 0.05%, (5) Fluocinonide 0.05%?

## 2023-07-30 ENCOUNTER — Telehealth: Payer: Self-pay

## 2023-07-30 ENCOUNTER — Ambulatory Visit (INDEPENDENT_AMBULATORY_CARE_PROVIDER_SITE_OTHER): Admitting: Allergy and Immunology

## 2023-07-30 VITALS — BP 108/82 | HR 73 | Temp 98.7°F | Resp 16 | Ht 70.87 in | Wt 182.8 lb

## 2023-07-30 DIAGNOSIS — L989 Disorder of the skin and subcutaneous tissue, unspecified: Secondary | ICD-10-CM | POA: Diagnosis not present

## 2023-07-30 DIAGNOSIS — L299 Pruritus, unspecified: Secondary | ICD-10-CM | POA: Diagnosis not present

## 2023-07-30 DIAGNOSIS — D7219 Other eosinophilia: Secondary | ICD-10-CM | POA: Diagnosis not present

## 2023-07-30 MED ORDER — DESONIDE 0.05 % EX CREA: TOPICAL_CREAM | Freq: Two times a day (BID) | CUTANEOUS | 0 refills | Status: DC

## 2023-07-30 MED ORDER — CETIRIZINE HCL 10 MG PO TABS: 10.0000 mg | ORAL_TABLET | Freq: Two times a day (BID) | ORAL | 1 refills | Status: DC

## 2023-07-30 MED ORDER — FLUOCINONIDE 0.1 % EX CREA
1.0000 | TOPICAL_CREAM | Freq: Two times a day (BID) | CUTANEOUS | 0 refills | Status: DC | PRN
Start: 2023-07-30 — End: 2023-07-31

## 2023-07-30 MED ORDER — TRIAMCINOLONE ACETONIDE 0.1 % EX CREA: 1.0000 | TOPICAL_CREAM | Freq: Two times a day (BID) | CUTANEOUS | 1 refills | Status: DC

## 2023-07-30 MED ORDER — CETIRIZINE HCL 10 MG PO TABS: 20.0000 mg | ORAL_TABLET | Freq: Two times a day (BID) | ORAL | 1 refills | Status: AC

## 2023-07-30 NOTE — Telephone Encounter (Signed)
 Spoke with the patient and informed him that Desonide and Fluicinonide will be sent in to the [facility] pharmacy. Informed him to let us  know if the medications are not working. Verbalized understanding.

## 2023-07-30 NOTE — Telephone Encounter (Signed)
*  Asthma/Allergy  Pharmacy Patient Advocate Encounter   Received notification from CoverMyMeds that prior authorization for Fluocinonide 0.1% cream  is required/requested.   Insurance verification completed.   The patient is insured through [facility] .   Per test claim: PA required; PA submitted to above mentioned insurance via CoverMyMeds Key/confirmation #/EOC [id number] Status is pending

## 2023-07-30 NOTE — Patient Instructions (Addendum)
  1.  Cetirizine  10 mg - 1-2 tablets 1-2 times per day (MAX=40mg /day).   2.  Triamcinolone  0.1% cream - 2 times per day  3. Blood - PDGFR/FIP1L1 translocation (FISH [id number]) for eosinophilia  4. Return to clinic in 4 weeks.

## 2023-07-30 NOTE — Telephone Encounter (Signed)
*  Asthma/Allergy  Pharmacy Patient Advocate Encounter   Received notification from CoverMyMeds that prior authorization for Desonide 0.05% cream  is required/requested.   Insurance verification completed.   The patient is insured through [facility] .   Per test claim: PA required; PA submitted to above mentioned insurance via CoverMyMeds Key/confirmation #/EOC [id number] Status is pending

## 2023-07-30 NOTE — Progress Notes (Signed)
 [place] - [place] - [place] - [place] - [place]   Follow-up Note  Referring Provider: Elvira Hammersmith, * Primary Provider: Elvira Hammersmith, MD Date of Office Visit: 07/30/2023  Subjective:   Levi Phillips (DOB: Mar 09, 1941) is a 83 y.o. male who returns to the [facility] on 07/30/2023 in re-evaluation of the following:  HPI: Blase returns to this clinic in evaluation of pruritic inflammatory dermatosis and eosinophilia addressed during his initial evaluation of 16 Jul 2023.  As a result of treatment administered during his last visit he is about 50% better regarding his dermatitis although he still itchy all over.  Because of his insurance company we were unable to get him a calcineurin inhibitor and he is just relying on the use of triamcinolone  at this point along with some cetirizine .  Allergies as of 07/30/2023   No Known Allergies      Medication List    allopurinol  100 MG tablet Commonly known as: ZYLOPRIM  Take 1 tablet (100 mg total) by mouth daily.   cetirizine  10 MG tablet Commonly known as: ZYRTEC  Take 2 tablets (20 mg total) by mouth 2 (two) times daily.   triamcinolone  cream 0.1 % Commonly known as: KENALOG  Apply 1 Application topically 2 (two) times daily.    Past Medical History:  Diagnosis Date   Allergy    Anemia    Arthritis    Back pain    spondylolisthesis   Enlarged prostate    takes Flomax  daily   Gout    takes Allopurinol  daily    Past Surgical History:  Procedure Laterality Date   cataract surgery Bilateral    EYE SURGERY     LITHOTRIPSY     done in [place]    Review of systems negative except as noted in HPI / PMHx or noted below:  Review of Systems  Constitutional: Negative.   HENT: Negative.    Eyes: Negative.   Respiratory: Negative.    Cardiovascular: Negative.   Gastrointestinal: Negative.   Genitourinary: Negative.   Musculoskeletal: Negative.   Skin: Negative.    Neurological: Negative.   Endo/Heme/Allergies: Negative.   Psychiatric/Behavioral: Negative.       Objective:   Vitals:   07/30/23 0831  BP: 108/82  Pulse: 73  Resp: 16  Temp: 98.7 F (37.1 C)  SpO2: 96%   Height: 5' 10.87" (180 cm)  Weight: 182 lb 12.8 oz (82.9 kg)   Physical Exam Skin:    Findings: Rash (Dramatic decrease in erythema and induration across body.) present.     Diagnostics: Results of blood tests obtained 16 Jul 2023 identifies transglutaminase IgA less than 2 with IgA 159 Mg/DL, WBC 9.1, absolute eosinophil 800, absolute lymphocyte 2300, hemoglobin 15.0, platelet 229    Assessment and Plan:   1. Inflammatory dermatosis   2. Other eosinophilia   3. Pruritus     1.  Cetirizine  10 mg - 1-2 tablets 1-2 times per day (MAX=40mg /day).   2.  Triamcinolone  0.1% cream - 2 times per day  3. Blood - PDGFR/FIP1L1 translocation (FISH [id number]) for eosinophilia  4. Return to clinic in 4 weeks.  [name] is better and hopefully this improvement will continue.  We empirically treated him for scabies with ivermectin  and Elimite and it may take several months for his immune activation to completely resolve as a result of that parasitic infection if indeed that was the cause of his immune activation.  To be completed we are going to check a PDGF  translocation in further investigation of his eosinophilia.  I will see him back in his clinic in 4 weeks.  Schuyler Custard, MD Allergy / Immunology [facility]

## 2023-07-30 NOTE — Addendum Note (Signed)
 Addended by: Merwyn Achilles on: 07/30/2023 11:01 AM   Modules accepted: Orders

## 2023-07-31 ENCOUNTER — Encounter: Payer: Self-pay | Admitting: Allergy and Immunology

## 2023-07-31 MED ORDER — FLUOCINONIDE 0.05 % EX CREA: 1.0000 | TOPICAL_CREAM | Freq: Two times a day (BID) | CUTANEOUS | 0 refills | Status: DC

## 2023-07-31 MED ORDER — DESONIDE 0.05 % EX OINT: 1.0000 | TOPICAL_OINTMENT | Freq: Two times a day (BID) | CUTANEOUS | 0 refills | Status: DC

## 2023-07-31 NOTE — Telephone Encounter (Signed)
 Pharmacy Patient Advocate Encounter  Received notification from [facility] that Prior Authorization for Desonide 0.05% cream has been DENIED.  Full denial letter will be uploaded to the media tab. See denial reason below.    Desonide cream is denied because it is not on your plan's Drug List (formulary). Medication authorization requires the following: (1) You need to try two (2) of these covered drugs: (a) Ala-Cort, hydrocortisone cream, hydrocortisone butyrate ointment, or hydrocortisone valerate cream. (b) Alclometasone. (c) Desonide ointment. (d) Fluocinolone body oil. (e) Fluticasone cream. (2) OR your doctor needs to give us  specific medical reasons why two (2) of the covered drug(s) are not appropriate for you.

## 2023-07-31 NOTE — Telephone Encounter (Signed)
 Pharmacy Patient Advocate Encounter  Received notification from [facility] that Prior Authorization for Fluocinonide 0.1% cream has been DENIED.  Full denial letter will be uploaded to the media tab. See denial reason below.   Fluocinonide 0.1% cream is denied because it is not on your plan's Drug List (formulary). Medication authorization requires the following: One of the following: (1) Both of the following: (A) You need to try this covered drug: Fluocinonide 0.05% cream. (B) You need to try one (1) of these covered drugs: (a) Betamethasone dipropionate cream, betamethasone dipropionate augmented cream, or betamethasone valerate ointment. (b) Cordran tape. (c) Desoximetasone cream. (d) Fluticasone ointment. (e) Halobetasol cream. (2) Your doctor needs to give us  specific medical reasons why fluocinonide 0.05% cream and one (1) of the covered drug(s) is not appropriate for you.

## 2023-07-31 NOTE — Telephone Encounter (Signed)
 New prescriptions have been sent. Pt made aware. Verbalized understanding.

## 2023-08-07 LAB — MPN W/HYPEREOSINOPHILIA FISH

## 2023-08-14 ENCOUNTER — Ambulatory Visit: Payer: Self-pay | Admitting: Allergy and Immunology

## 2023-08-15 ENCOUNTER — Ambulatory Visit: Admitting: Allergy and Immunology

## 2023-08-26 ENCOUNTER — Telehealth: Payer: Self-pay | Admitting: Allergy and Immunology

## 2023-08-26 NOTE — Telephone Encounter (Signed)
 Patient called and stated he was returning a voice mail, and that he wanted a nurse to call him back. He states that he is not doing any better with his symptoms from before, and wants to know if Dr. Jerelene Monday would like for him to come back in. Best number to contact [phone]

## 2023-08-27 ENCOUNTER — Telehealth: Payer: Self-pay | Admitting: Allergy and Immunology

## 2023-08-27 NOTE — Telephone Encounter (Signed)
 Per Provider:  Lets get him to come by the [place] clinic today to start him on Dupilumab injections every 2 weeks starting with a clinic sample for the diagnosis of atopic dermatitis. I will see him after his second injection of dupilumab.    [employer] used:  Name: Marva Sleight ID: [id number]   Called patient - DOB/NEED DPR verified - LMOVM regarding above provider notation.  If/When patient call back - please advise of provider notation above.

## 2023-08-27 NOTE — Telephone Encounter (Signed)
 Bary called again this morning and he really wants to talk to you. He says that his symptoms have not improved at all and that he is not able to sleep at night. He is a little hard to understand, but he was literally begging for us  to help. He called from [phone]

## 2023-09-03 NOTE — Patient Instructions (Incomplete)
  1.  Cetirizine  10 mg - 1-2 tablets 1-2 times per day (MAX=40mg /day).   2.  Triamcinolone  0.1% cream - 2 times per day  3. Blood - PDGFR/FIP1L1 translocation (FISH [id number]) for eosinophilia  4. Return to clinic in 4 weeks.

## 2023-09-03 NOTE — Progress Notes (Signed)
   [address] [place] [address] Dept: [phone]  FOLLOW UP NOTE  Patient ID: Levi Phillips, male    DOB: Feb 20, 1941  Age: 83 y.o. MRN: [id number] Date of Office Visit: 09/05/2023  Assessment  Chief Complaint: No chief complaint on file.  HPI Levi Phillips is an 83 year old male who presents to the clinic for follow-up visit.  He was last seen in this clinic on 22,025 by Dr. Kozlow for evaluation of inflammatory dermatosis, eosinophilia, and pruritus.  He was previously treated for scabies.  Discussed the use of AI scribe software for clinical note transcription with the patient, who gave verbal consent to proceed.  History of Present Illness   Chart review Results of blood tests obtained 16 Jul 2023 identifies transglutaminase IgA less than 2 with IgA 159 Mg/DL, WBC 9.1, absolute eosinophil 800, absolute lymphocyte 2300, hemoglobin 15.0, platelet 229      Component Ref Range & Units (hover) 1 mo ago  Specimen Type Comment:  Comment: BLOOD  Cells Counted Comment:  Comment: 200/PROBE  Cells Analyzed Comment:  Comment: 200/PROBE  FISH Result Comment:  Comment: NO DELETION OF CHIC2 OR REARRANGEMENT OF PDGFRA, PDGFRB, JAK2 OR FGFR1 OBSERVED  Interpretation Comment:  Comment: NEGATIVE    The MPN with hypereosinophilia fluorescence panel in situ   hybridization (FISH) analysis was normal. No FGFR1, JAK2, PDGFRA, or PDGFRB rearrangements were observed.     SPECIFIC PROBE RESULTS:     FGFR1: NORMAL       nuc ish 8p11.2(FGFR1x2)[200]     JAK2: NORMAL       nuc ish 9p24.1(JAK2x2)[200]     PDGFRA: NORMAL       nuc ish 4q12(FIP1L1,CHIC2,PDGFRA)x2[200]     PDGFRB: NORMAL       nuc ish 5q32(PDGFRBx2)[200]      Drug Allergies:  No Known Allergies  Physical Exam: There were no vitals taken for this visit.   Physical Exam  Diagnostics:    Assessment and Plan: No diagnosis found.  No orders of the defined types were placed in this encounter.   There are  no Patient Instructions on file for this visit.  No follow-ups on file.    Thank you for the opportunity to care for this patient.  Please do not hesitate to contact me with questions.  Arlean Mutter, FNP [facility]

## 2023-09-05 ENCOUNTER — Encounter: Payer: Self-pay | Admitting: Family Medicine

## 2023-09-05 ENCOUNTER — Ambulatory Visit: Admitting: Family Medicine

## 2023-09-05 ENCOUNTER — Other Ambulatory Visit: Payer: Self-pay

## 2023-09-05 VITALS — BP 100/70 | HR 83 | Temp 98.2°F | Resp 20

## 2023-09-05 DIAGNOSIS — L989 Disorder of the skin and subcutaneous tissue, unspecified: Secondary | ICD-10-CM

## 2023-09-05 DIAGNOSIS — D7219 Other eosinophilia: Secondary | ICD-10-CM | POA: Diagnosis not present

## 2023-09-05 DIAGNOSIS — L299 Pruritus, unspecified: Secondary | ICD-10-CM | POA: Diagnosis not present

## 2023-09-05 MED ORDER — LEVOCETIRIZINE DIHYDROCHLORIDE 5 MG PO TABS
5.0000 mg | ORAL_TABLET | Freq: Two times a day (BID) | ORAL | 5 refills | Status: DC | PRN
Start: 2023-09-05 — End: 2023-11-12

## 2023-09-05 MED ORDER — FAMOTIDINE 20 MG PO TABS: 20.0000 mg | ORAL_TABLET | Freq: Two times a day (BID) | ORAL | 5 refills | Status: DC | PRN

## 2023-10-07 ENCOUNTER — Ambulatory Visit: Admitting: Family Medicine

## 2023-10-18 ENCOUNTER — Telehealth: Payer: Self-pay | Admitting: Family Medicine

## 2023-10-18 NOTE — Telephone Encounter (Signed)
 Pt called and stated that he was doing better for a while and finished his medications, but now the itching has returned. He did not want to schedule a visit yet, but wanted me to let his provider know this update and see if more medication could possibly be sent in for him first. Best contact [phone]. He does speak some english.

## 2023-10-21 NOTE — Telephone Encounter (Signed)
 Please have him call if his symptoms return. Thank you

## 2023-10-24 ENCOUNTER — Other Ambulatory Visit: Payer: Self-pay

## 2023-10-24 ENCOUNTER — Ambulatory Visit: Admitting: Family Medicine

## 2023-10-24 VITALS — BP 110/70 | HR 76 | Temp 98.0°F

## 2023-10-24 DIAGNOSIS — L299 Pruritus, unspecified: Secondary | ICD-10-CM | POA: Diagnosis not present

## 2023-10-24 DIAGNOSIS — D7219 Other eosinophilia: Secondary | ICD-10-CM | POA: Diagnosis not present

## 2023-10-24 DIAGNOSIS — L989 Disorder of the skin and subcutaneous tissue, unspecified: Secondary | ICD-10-CM

## 2023-10-24 MED ORDER — DUPILUMAB 300 MG/2ML ~~LOC~~ SOSY
300.0000 mg | PREFILLED_SYRINGE | SUBCUTANEOUS | Status: AC
  Administered 2023-10-24 – 2024-04-17 (×13): 300 mg via SUBCUTANEOUS

## 2023-10-24 NOTE — Progress Notes (Signed)
 [address] [name] [address] Dept: [phone]  FOLLOW UP NOTE  Patient ID: Levi Phillips, male    DOB: Dec 25, 1940  Age: 83 y.o. MRN: [id number] Date of Office Visit: 10/24/2023  Assessment  Chief Complaint: Pruritus (C/o night itching and sometime during the day.)  HPI Levi Phillips is an 83 year old male who presents to the clinic for a follow-up visit.  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 At that time, he had tried high dose antihistamines and this therapy was not providing any relief. Prior to his last visit, he had contacted the clinic and Dr. Kozlow has suggested beginning Dupixent  every 14 days with a follow up visit in 4 weeks to assess efficacy. At that time., he was not interested in beginning Dupixent  and wanted to try different medications.   At today's visit, he reports that his new antihistamine regimen has provided some relief of itch and rash, however, he reports the rash and itch returns, especially at nighttime. He is currently taking levocetirizine 5 mg twice a day and famotidine  20 mg twice a day with moderate relief of symptoms. Lab work including CBC and CMP have been collected previously. We will move forward with thyroid  studies and chest xray for evaluation of pruritus. He is interested in moving forward with Dupixent  at today's visit. An interpreter is available for the visit.   His current medications are listed in the chart.   Results reviewed: MPN w/hypersinophilia FISH panel was normal on 07/30/2023 CBC 07/16/2023- elevated eosinophil level has remained stable.  CMP 05/08/2023  Drug Allergies:  No Known Allergies  Physical Exam: BP 110/70   Pulse 76   Temp 98 F (36.7 C)   SpO2 96%    Physical Exam Vitals reviewed.  Constitutional:      Appearance: Normal appearance.  HENT:     Head: Normocephalic and atraumatic.     Right Ear: Tympanic membrane normal.     Left Ear: Tympanic membrane  normal.     Nose:     Comments: Bilateral nares normal. Pharynx normal. Ears normal. Eyes normal.    Mouth/Throat:     Pharynx: Oropharynx is clear.  Eyes:     Conjunctiva/sclera: Conjunctivae normal.  Cardiovascular:     Rate and Rhythm: Normal rate and regular rhythm.     Heart sounds: Normal heart sounds. No murmur heard. Pulmonary:     Effort: Pulmonary effort is normal.     Breath sounds: Normal breath sounds.     Comments: Lungs clear to auscultation Musculoskeletal:        General: Normal range of motion.     Cervical back: Normal range of motion and neck supple.  Skin:    General: Skin is warm and dry.  Neurological:     Mental Status: He is alert and oriented to person, place, and time.  Psychiatric:        Mood and Affect: Mood normal.        Behavior: Behavior normal.        Thought Content: Thought content normal.        Judgment: Judgment normal.     Assessment and Plan: 1. Pruritus   2. Inflammatory dermatosis   3. Other eosinophilia     Meds ordered this encounter  Medications   dupilumab  (DUPIXENT ) prefilled syringe 300 mg    Patient Instructions   1.  Levocetirizine 5 mg twice a day. Famotidine  20 mg twice a day  2.  Triamcinolone 0.1% cream - 2 times per day. Protopic twice a day  3.  Dupixent given in the clinic. Continue Dupixent once every 2 weeks  4.  Lab and chest xray  5. Return to clinic in 4 weeks with Dr. Kozlow.  Return in about 4 weeks (around 11/21/2023), or if symptoms worsen or fail to improve.    Thank you for the opportunity to care for this patient.  Please do not hesitate to contact me with questions.  Arlean Mutter, FNP [facility] 

## 2023-10-24 NOTE — Progress Notes (Signed)
 Immunotherapy   Patient Details  Name: Levi Phillips MRN: [id number] Date of Birth: 01/31/1941  10/24/2023  Levi Phillips started biologic immunotherapy for dupixent  receiving 600mg  loading dose.  Frequency: Every 2 weeks Consent signed and patient instructions given. Patient waited in office 15 minutes with no issues.    Levi Phillips Shed 10/24/2023, 1:19 PM

## 2023-10-24 NOTE — Patient Instructions (Signed)
  1.  Levocetirizine 5 mg twice a day. Famotidine  20 mg twice a day  2.  Triamcinolone  0.1% cream - 2 times per day. Protopic  twice a day  3.  Dupixent  given in the clinic. Continue Dupixent  once every 2 weeks  4.  Lab and chest xray  5. Return to clinic in 4 weeks with Dr. Kozlow.

## 2023-10-25 ENCOUNTER — Encounter: Payer: Self-pay | Admitting: Family Medicine

## 2023-10-25 ENCOUNTER — Ambulatory Visit: Payer: Self-pay | Admitting: Family Medicine

## 2023-10-25 LAB — THYROID PANEL WITH TSH
Free Thyroxine Index: 1.8 (ref 1.2–4.9)
T3 Uptake Ratio: 28 % (ref 24–39)
T4, Total: 6.5 ug/dL (ref 4.5–12.0)
TSH: 1.24 u[IU]/mL (ref 0.450–4.500)

## 2023-10-25 NOTE — Progress Notes (Signed)
 Can you please let this patient know that his thyroid  panel was within normal limits? Thank you

## 2023-10-29 ENCOUNTER — Telehealth: Payer: Self-pay | Admitting: *Deleted

## 2023-10-29 NOTE — Telephone Encounter (Signed)
-----   Message from Arlean Mutter sent at 10/24/2023  9:11 PM EDT ----- Hi there Simcha Speir, Can you please submit for Dupixent  for hives and pruritus for this patient. He had a sample in the clinic today per Dr. Rowan last note. Thank you

## 2023-10-29 NOTE — Telephone Encounter (Signed)
 L/m for patient to contact me to advise approval and submit for Dupixent  to [employer]

## 2023-11-12 ENCOUNTER — Other Ambulatory Visit: Payer: Self-pay

## 2023-11-12 ENCOUNTER — Ambulatory Visit (INDEPENDENT_AMBULATORY_CARE_PROVIDER_SITE_OTHER): Admitting: Allergy and Immunology

## 2023-11-12 ENCOUNTER — Other Ambulatory Visit (HOSPITAL_COMMUNITY): Payer: Self-pay

## 2023-11-12 VITALS — BP 94/70 | HR 75 | Temp 97.8°F | Ht 70.87 in | Wt 180.9 lb

## 2023-11-12 DIAGNOSIS — L209 Atopic dermatitis, unspecified: Secondary | ICD-10-CM

## 2023-11-12 DIAGNOSIS — L2089 Other atopic dermatitis: Secondary | ICD-10-CM | POA: Diagnosis not present

## 2023-11-12 DIAGNOSIS — L989 Disorder of the skin and subcutaneous tissue, unspecified: Secondary | ICD-10-CM

## 2023-11-12 DIAGNOSIS — D7219 Other eosinophilia: Secondary | ICD-10-CM

## 2023-11-12 MED ORDER — TACROLIMUS 0.1 % EX OINT: TOPICAL_OINTMENT | Freq: Two times a day (BID) | CUTANEOUS | 1 refills | Status: AC

## 2023-11-12 MED ORDER — LEVOCETIRIZINE DIHYDROCHLORIDE 5 MG PO TABS: 5.0000 mg | ORAL_TABLET | Freq: Two times a day (BID) | ORAL | 5 refills | Status: DC | PRN

## 2023-11-12 MED ORDER — FAMOTIDINE 20 MG PO TABS: 20.0000 mg | ORAL_TABLET | Freq: Two times a day (BID) | ORAL | 1 refills | Status: DC | PRN

## 2023-11-12 MED ORDER — TRIAMCINOLONE ACETONIDE 0.1 % EX CREA: 1.0000 | TOPICAL_CREAM | Freq: Two times a day (BID) | CUTANEOUS | 1 refills | Status: DC

## 2023-11-12 NOTE — Progress Notes (Unsigned)
 [place] - [place] - [place] - [place] - [place]   Follow-up Note  Referring Provider: Purcell Emil Schanz, * Primary Provider: Purcell Emil Schanz, MD Date of Office Visit: 11/12/2023  Subjective:   Levi Phillips (DOB: 1940-04-17) is a 83 y.o. male who [facility] in re-evaluation of the following:  HPI: Levi Phillips returns to this clinic in evaluation of inflammatory dermatosis/pruritic disorder, presently called atopic dermatitis,  associated with eosinophilia. I last saw him in this clinic 05 September 2023 and he visited with our nurse practitioner on 31 October 2023.  He finally acquiesced to receiving a dupilumab  injection on 31 October 2023 and this resulted in at least 60% improvement regarding his itchy lesions.  He would like to continue with this agent.  Allergies as of 11/12/2023   No Known Allergies      Medication List    allopurinol  100 MG tablet Commonly known as: ZYLOPRIM  Take 1 tablet (100 mg total) by mouth daily.   cetirizine  10 MG tablet Commonly known as: ZYRTEC  Take 2 tablets (20 mg total) by mouth 2 (two) times daily.   desonide  0.05 % ointment Commonly known as: DESOWEN  Apply 1 Application topically 2 (two) times daily.   famotidine  20 MG tablet Commonly known as: PEPCID  Take 1 tablet (20 mg total) by mouth 2 (two) times daily as needed for heartburn or indigestion.   fluocinonide  cream 0.05 % Commonly known as: LIDEX  Apply 1 Application topically 2 (two) times daily.   ivermectin  3 MG Tabs tablet Commonly known as: STROMECTOL  Take 5 pills today then repeat in ten days then stop.   levocetirizine 5 MG tablet Commonly known as: XYZAL  Take 1 tablet (5 mg total) by mouth 2 (two) times daily as needed for allergies.   mometasone  0.1 % cream Commonly known as: ELOCON  Apply 1 Application topically in the morning and at bedtime.   tacrolimus  0.1 % ointment Commonly known as: PROTOPIC  Apply  topically 2 (two) times daily.   triamcinolone  cream 0.1 % Commonly known as: KENALOG  Apply 1 Application topically 2 (two) times daily.      Past Medical History:  Diagnosis Date   Allergy    Anemia    Arthritis    Back pain    spondylolisthesis   Enlarged prostate    takes Flomax  daily   Gout    takes Allopurinol  daily    Past Surgical History:  Procedure Laterality Date   cataract surgery Bilateral    EYE SURGERY     LITHOTRIPSY     done in [place]    Review of systems negative except as noted in HPI / PMHx or noted below:  Review of Systems  Constitutional: Negative.   HENT: Negative.    Eyes: Negative.   Respiratory: Negative.    Cardiovascular: Negative.   Gastrointestinal: Negative.   Genitourinary: Negative.   Musculoskeletal: Negative.   Skin: Negative.   Neurological: Negative.   Endo/Heme/Allergies: Negative.   Psychiatric/Behavioral: Negative.       Objective:   Vitals:   11/12/23 1116  BP: 94/70  Pulse: 75  Temp: 97.8 F (36.6 C)  SpO2: 96%   Height: 5' 10.87 (180 cm)  Weight: 180 lb 14.4 oz (82.1 kg)   Physical Exam Skin:    Findings: Rash (patchy erythematous lesions with evidence of excoriation lower extremities) present.     Diagnostics: Results of a hypereosinophilic FISH blood test obtained 30 Jul 2023 identifies no translocation of the  PDGFR gene  Assessment and Plan:   1. Other atopic dermatitis   2. Inflammatory dermatosis   3. Other eosinophilia   4. Atopic dermatitis, unspecified type    1.  Continue Levocetirizine 5 mg twice a day + Famotidine  20 mg twice a day  2.  Triamcinolone  0.1% cream + Tacrolimus  0.1% - 2 times per day  3.  Another injection of Dupilumab  delivered in clinic today and every 2 weeks  4. Tammy will contact you about insurance approval for Dupilumab   5.  Return to clinic in 8 weeks.  Levi Phillips has had a rather significant improvement regarding his inflammatory dermatosis/atopic  dermatitis with a single injection of dupilumab  and I would like to continue to have him use this agent.  He is not very happy that he does not have a reason for why he is having his skin problem but he did acquiesced to using at least 8 weeks of dupilumab .  Hopefully at the end of 8 weeks he will have no lesions and no itchiness and we will convince him to remain on this agent at least on a monthly basis.  Camellia Denis, MD Allergy / Immunology [facility]

## 2023-11-12 NOTE — Progress Notes (Unsigned)
 Rph done with onboarding. Pending patient call Tammy back for her to send in script. Patient notified that Madelin is out of office and to call back after the 10th. Patient states he will call office on the 12th.

## 2023-11-12 NOTE — Patient Instructions (Signed)
  1.  Continue Levocetirizine 5 mg twice a day + Famotidine  20 mg twice a day  2.  Triamcinolone  0.1% cream + Tacrolimus  0.1% - 2 times per day  3.  Another injection of Dupilumab  delivered in clinic today and every 2 weeks  4. Tammy will contact you about insurance approval for Dupilumab   5.  Return to clinic in 8 weeks.

## 2023-11-12 NOTE — Progress Notes (Signed)
 Specialty Pharmacy Initiation Note   Levi Phillips is a 83 y.o. male who will be followed by the specialty pharmacy service for RxSp Atopic Dermatitis    Review of administration, indication, effectiveness, safety, potential side effects, storage/disposable, and missed dose instructions occurred today for patient's specialty medication(s) Dupilumab  (DUPIXENT )     Patient/Caregiver did not have any additional questions or concerns.   Patient's therapy is appropriate to: Continue (Patient has had 2 injections in office, provider notes report benefit so far.)    Goals Addressed             This Visit's Progress    Reduce signs and symptoms       Patient is on track. Patient will maintain adherence         [name] Specialty Pharmacist

## 2023-11-12 NOTE — Telephone Encounter (Signed)
 Patient will call office for Tammy on the 12th so script can be sent in. Advised patient that if they did not do this that there was no guarantee that medication would be at the office on time for next inj appointment.

## 2023-11-13 ENCOUNTER — Encounter: Payer: Self-pay | Admitting: Allergy and Immunology

## 2023-11-13 MED ORDER — DUPIXENT 300 MG/2ML ~~LOC~~ SOSY
300.0000 mg | PREFILLED_SYRINGE | SUBCUTANEOUS | 11 refills | Status: AC
  Filled 2023-11-15: qty 4, 28d supply, fill #0
  Filled 2023-12-17: qty 4, 28d supply, fill #1
  Filled 2024-01-08: qty 4, 28d supply, fill #2
  Filled 2024-02-03: qty 4, 28d supply, fill #3
  Filled 2024-03-02 – 2024-03-04 (×2): qty 4, 28d supply, fill #4
  Filled 2024-03-27 – 2024-04-08 (×2): qty 4, 28d supply, fill #5

## 2023-11-15 ENCOUNTER — Other Ambulatory Visit: Payer: Self-pay

## 2023-11-15 ENCOUNTER — Other Ambulatory Visit (HOSPITAL_COMMUNITY): Payer: Self-pay

## 2023-11-15 NOTE — Progress Notes (Signed)
 Specialty Pharmacy Initial Fill Coordination Note  Levi Phillips is a 84 y.o. male contacted today regarding initial fill of specialty medication(s) Dupilumab  (Dupixent )   Patient requested Courier to Provider Office   Delivery date: 11/20/23   Verified address: [address] [place], [address]   Medication will be filled on 09/09.   Patient is aware of $0.00 copayment.

## 2023-11-19 ENCOUNTER — Other Ambulatory Visit: Payer: Self-pay

## 2023-11-19 ENCOUNTER — Other Ambulatory Visit (HOSPITAL_COMMUNITY): Payer: Self-pay

## 2023-11-19 DIAGNOSIS — H93291 Other abnormal auditory perceptions, right ear: Secondary | ICD-10-CM | POA: Diagnosis not present

## 2023-11-19 DIAGNOSIS — J342 Deviated nasal septum: Secondary | ICD-10-CM | POA: Diagnosis not present

## 2023-11-19 DIAGNOSIS — H6121 Impacted cerumen, right ear: Secondary | ICD-10-CM | POA: Diagnosis not present

## 2023-11-22 ENCOUNTER — Other Ambulatory Visit: Payer: Self-pay | Admitting: Emergency Medicine

## 2023-11-22 ENCOUNTER — Telehealth: Payer: Self-pay | Admitting: Radiology

## 2023-11-22 ENCOUNTER — Ambulatory Visit: Payer: Self-pay

## 2023-11-22 MED ORDER — METHOCARBAMOL 750 MG PO TABS: 750.0000 mg | ORAL_TABLET | Freq: Three times a day (TID) | ORAL | 1 refills | Status: AC | PRN

## 2023-11-22 NOTE — Telephone Encounter (Signed)
 FYI Only or Action Required?: FYI only for provider.  Patient was last seen in primary care on 05/08/2023 by Purcell Emil Schanz, MD.  Called Nurse Triage reporting Back Pain.  Symptoms began several days ago.  Interventions attempted: OTC medications: Tylenol .  Symptoms are: gradually worsening.  Triage Disposition: No disposition on file.  Patient/caregiver understands and will follow disposition?:   **Pt. Declined an appointment, he only wants pain medicine prescribed.**                Copied from CRM [id number]. Topic: Clinical - Red Word Triage >> Nov 22, 2023  2:50 PM Fonda T wrote: [facility] that prompted transfer to Nurse Triage: Patient calling reports having back pain on both sides. Reason for Disposition  [1] MODERATE back pain (e.g., interferes with normal activities) AND [2] present > 3 days  Answer Assessment - Initial Assessment Questions 1. ONSET: When did the pain begin? (e.g., minutes, hours, days)      Ongoing x 1 year, pain is getting worse for the past 3 days  2. LOCATION: Where does it hurt? (upper, mid or lower back)     Lower   3. SEVERITY: How bad is the pain?  (e.g., Scale 1-10; mild, moderate, or severe)     Moderate  4. PATTERN: Is the pain constant? (e.g., yes, no; constant, intermittent)      Intermittent   5. RADIATION: Does the pain shoot into your legs or somewhere else?     No  6. CAUSE:  What do you think is causing the back pain?      Unknown   7. BACK OVERUSE:  Any recent lifting of heavy objects, strenuous work or exercise?     No   8. MEDICINES: What have you taken so far for the pain? (e.g., nothing, acetaminophen , NSAIDS)     Tylenol   9. NEUROLOGIC SYMPTOMS: Do you have any weakness, numbness, or problems with bowel/bladder control?     No  10. OTHER SYMPTOMS: Do you have any other symptoms? (e.g., fever, abdomen pain, burning with urination, blood in urine)  No   Pt. Declined an appt.  Only wants pain medication to be called in  Protocols used: Back Pain-A-AH

## 2023-11-22 NOTE — Telephone Encounter (Signed)
 New prescription for Robaxin  sent to pharmacy of record today.

## 2023-11-22 NOTE — Telephone Encounter (Signed)
 Copied from CRM #[id number]. Topic: Clinical - Medication Question >> Nov 22, 2023  2:44 PM Fonda T wrote: Reason for CRM: Patient calling requesting refill on medication, states he has some on hand but has expired.  Medication: methocarbamol  (ROBAXIN ) 500 MG tablet.  Patient requesting for same medication stronger dose, or another medication for a new prescription to be sent to his pharmacy.  Patient reports has been taking Advil , but not helping.  Last seen in office was 05/08/23.  Patient declines any current pain at time of call, reports wants to have on hand whenever a spasm occurs.  Patient can be reached at [phone] if need to discuss further.   [facility] [address], [place] - [address]  [place] [address] Phone: [phone] Fax: [phone]

## 2023-11-25 NOTE — Telephone Encounter (Signed)
 Dispensed and appt made to start

## 2023-11-26 ENCOUNTER — Other Ambulatory Visit: Payer: Self-pay

## 2023-11-29 ENCOUNTER — Ambulatory Visit (INDEPENDENT_AMBULATORY_CARE_PROVIDER_SITE_OTHER)

## 2023-11-29 DIAGNOSIS — L209 Atopic dermatitis, unspecified: Secondary | ICD-10-CM | POA: Diagnosis not present

## 2023-12-10 ENCOUNTER — Other Ambulatory Visit: Payer: Self-pay

## 2023-12-12 ENCOUNTER — Ambulatory Visit (INDEPENDENT_AMBULATORY_CARE_PROVIDER_SITE_OTHER)

## 2023-12-12 DIAGNOSIS — L209 Atopic dermatitis, unspecified: Secondary | ICD-10-CM

## 2023-12-17 ENCOUNTER — Other Ambulatory Visit: Payer: Self-pay

## 2023-12-17 NOTE — Progress Notes (Signed)
 Specialty Pharmacy Refill Coordination Note  Levi Phillips is a 83 y.o. male assessed today regarding refills of clinic administered specialty medication(s) Dupilumab  (Dupixent )   Clinic requested Courier to Provider Office   Delivery date: 12/19/23   Verified address: [address] [place], [address]   Medication will be filled on 10.08.25.   Copay: $0.00 Appointment: 10.14.25

## 2023-12-24 ENCOUNTER — Ambulatory Visit

## 2023-12-24 DIAGNOSIS — L209 Atopic dermatitis, unspecified: Secondary | ICD-10-CM

## 2024-01-08 ENCOUNTER — Other Ambulatory Visit: Payer: Self-pay

## 2024-01-08 ENCOUNTER — Other Ambulatory Visit (HOSPITAL_COMMUNITY): Payer: Self-pay

## 2024-01-08 ENCOUNTER — Ambulatory Visit (INDEPENDENT_AMBULATORY_CARE_PROVIDER_SITE_OTHER)

## 2024-01-08 DIAGNOSIS — L209 Atopic dermatitis, unspecified: Secondary | ICD-10-CM | POA: Diagnosis not present

## 2024-01-08 NOTE — Progress Notes (Signed)
 Specialty Pharmacy Refill Coordination Note  Levi Phillips is a 83 y.o. male assessed today regarding refills of clinic administered specialty medication(s) Dupilumab  (Dupixent )   Clinic requested Courier to Provider Office   Delivery date: 01/15/24   Verified address: [address] [place], [address]   Medication will be filled on: 01/14/24

## 2024-01-14 ENCOUNTER — Other Ambulatory Visit: Payer: Self-pay

## 2024-01-19 NOTE — Patient Instructions (Incomplete)
  1.  Continue Levocetirizine 5 mg twice a day + Famotidine  20 mg twice a day  2.  Triamcinolone  0.1% cream + Tacrolimus  0.1% - 2 times per day  3.  Continue Dupixent  once every 2 weeks  4. Return to the clinic in 4 months or sooner if needed

## 2024-01-19 NOTE — Progress Notes (Signed)
 "  [address] [place] [address] Dept: [phone]  FOLLOW UP NOTE  Patient ID: Levi Phillips, male    DOB: [birth year]  Age: 83 y.o. MRN: [id number] Date of Office Visit: 01/20/2024  Assessment  Chief Complaint: Follow-up and Eczema (Doing much better. Occasional flare on his legs. )  HPI Levi Phillips is an 83 year old male who presents to the clinicc for a follow-up visit.  Levi Phillips was last seen in this clinic on 12/05/2023 by Dr. Kozlow for evaluation of atopic dermatitis, inflammatory dermatosis, and eosinophilia.  An interpreter is available throughout the entirety of the visit.  Discussed the use of AI scribe software for clinical note transcription with the patient, who gave verbal consent to proceed.  History of Present Illness Levi Phillips is an 83 year old male who presents with itching and skin irritation.  Levi Phillips is currently using Dupixent  injections and reports less itching. Levi Phillips reports no issues with the medication including large local reactions.  Levi Phillips reports a significant decrease in his itch while continuing on Dupixent . Levi Phillips takes levocetirizine twice daily and famotidine  twice a day, which Levi Phillips finds effective in managing his symptoms.  Levi Phillips does report 1 spot on his right foot which is occasionally itchy for which Levi Phillips uses a moisturizing routine and occasionally uses Protopic  or triamcinolone .  Levi Phillips rarely uses mometasone  for relief of symptoms.  Overall, Levi Phillips is very pleased with the decrease in itching and his current medical regimen.  Levi Phillips does report that Levi Phillips was bitten by ants on his left leg while walking through grass several weeks ago.  Levi Phillips reports these areas are somewhat itchy, however, the areas and itch are beginning to fade.  Levi Phillips did not experience cardiopulmonary or gastrointestinal symptoms with this itch.  His current medications are listed in the chart.   Drug Allergies:  No Known Allergies  Physical Exam: BP 130/78   Pulse 74   Temp 98 F (36.7 C)   Resp 19    SpO2 96%    Physical Exam Vitals reviewed.  Constitutional:      Appearance: Normal appearance.  HENT:     Head: Normocephalic and atraumatic.     Right Ear: Tympanic membrane normal.     Left Ear: Tympanic membrane normal.     Nose: Nose normal.     Mouth/Throat:     Pharynx: Oropharynx is clear.  Eyes:     Conjunctiva/sclera: Conjunctivae normal.  Cardiovascular:     Rate and Rhythm: Normal rate and regular rhythm.     Heart sounds: Normal heart sounds. No murmur heard. Pulmonary:     Effort: Pulmonary effort is normal.     Breath sounds: Normal breath sounds.     Comments: Lungs clear to auscultation Musculoskeletal:        General: Normal range of motion.     Cervical back: Normal range of motion and neck supple.  Skin:    General: Skin is warm and dry.     Comments: Flat, red, areas on his left shin consistent with ant bite.  No open areas or drainage noted.  Neurological:     Mental Status: Levi Phillips is alert and oriented to person, place, and time.  Psychiatric:        Mood and Affect: Mood normal.        Behavior: Behavior normal.        Thought Content: Thought content normal.        Judgment: Judgment normal.     Assessment and Plan: 1.  Allergic rhinitis, unspecified seasonality, unspecified trigger   2. Atopic dermatitis, unspecified type   3. Inflammatory dermatosis   4. Other eosinophilia   5. Pruritus     Meds ordered this encounter  Medications   levocetirizine (XYZAL ) 5 MG tablet    Sig: Take 1 tablet (5 mg total) by mouth 2 (two) times daily as needed for allergies.    Dispense:  60 tablet    Refill:  5   famotidine  (PEPCID ) 20 MG tablet    Sig: Take 1 tablet (20 mg total) by mouth 2 (two) times daily as needed for heartburn or indigestion.    Dispense:  180 tablet    Refill:  1    Patient Instructions   1.  Continue Levocetirizine 5 mg twice a day + Famotidine  20 mg twice a day  2.  Triamcinolone  0.1% cream + Tacrolimus  0.1% - 2 times per  day  3.  Continue Dupixent  once every 2 weeks  4. Return to the clinic in 4 months or sooner if needed   Return in about 4 months (around 05/19/2024), or if symptoms worsen or fail to improve.    Thank you for the opportunity to care for this patient.  Please do not hesitate to contact me with questions.  Arlean Mutter, FNP [facility]       "

## 2024-01-20 ENCOUNTER — Encounter: Payer: Self-pay | Admitting: Family Medicine

## 2024-01-20 ENCOUNTER — Telehealth: Payer: Self-pay

## 2024-01-20 ENCOUNTER — Ambulatory Visit

## 2024-01-20 ENCOUNTER — Other Ambulatory Visit: Payer: Self-pay

## 2024-01-20 ENCOUNTER — Ambulatory Visit (INDEPENDENT_AMBULATORY_CARE_PROVIDER_SITE_OTHER): Admitting: Family Medicine

## 2024-01-20 VITALS — BP 130/78 | HR 74 | Temp 98.0°F | Resp 19

## 2024-01-20 DIAGNOSIS — L209 Atopic dermatitis, unspecified: Secondary | ICD-10-CM

## 2024-01-20 DIAGNOSIS — L989 Disorder of the skin and subcutaneous tissue, unspecified: Secondary | ICD-10-CM

## 2024-01-20 DIAGNOSIS — L299 Pruritus, unspecified: Secondary | ICD-10-CM

## 2024-01-20 DIAGNOSIS — D7219 Other eosinophilia: Secondary | ICD-10-CM | POA: Diagnosis not present

## 2024-01-20 DIAGNOSIS — J309 Allergic rhinitis, unspecified: Secondary | ICD-10-CM

## 2024-01-20 MED ORDER — LEVOCETIRIZINE DIHYDROCHLORIDE 5 MG PO TABS: 5.0000 mg | ORAL_TABLET | Freq: Two times a day (BID) | ORAL | 5 refills | Status: DC | PRN

## 2024-01-20 MED ORDER — FAMOTIDINE 20 MG PO TABS: 20.0000 mg | ORAL_TABLET | Freq: Two times a day (BID) | ORAL | 1 refills | Status: DC | PRN

## 2024-01-20 NOTE — Telephone Encounter (Signed)
 Questions populated and submitted to plan. Pending determination.

## 2024-01-20 NOTE — Telephone Encounter (Signed)
*  AA  Pharmacy Patient Advocate Encounter   Received notification from CoverMyMeds that prior authorization for Levocetirizine Dihydrochloride  5MG  tablets  is required/requested.   Insurance verification completed.   The patient is insured through [employer].   Per test claim: PA required; PA started via CoverMyMeds. KEY [id number] . Waiting for clinical questions to populate.

## 2024-01-21 NOTE — Telephone Encounter (Signed)
 Your request has been approved Request Reference Number: [id number]. LEVOCETIRIZI TAB 5MG  is approved through 03/11/2025. Your patient may now fill this prescription and it will be covered. Authorization Expiration12/31/2026

## 2024-02-03 ENCOUNTER — Ambulatory Visit (INDEPENDENT_AMBULATORY_CARE_PROVIDER_SITE_OTHER)

## 2024-02-03 ENCOUNTER — Other Ambulatory Visit (HOSPITAL_COMMUNITY): Payer: Self-pay

## 2024-02-03 ENCOUNTER — Other Ambulatory Visit: Payer: Self-pay

## 2024-02-03 DIAGNOSIS — L209 Atopic dermatitis, unspecified: Secondary | ICD-10-CM | POA: Diagnosis not present

## 2024-02-03 NOTE — Progress Notes (Signed)
 Specialty Pharmacy Refill Coordination Note  Levi Phillips is a 83 y.o. male contacted today regarding refills of specialty medication(s) Dupilumab  (Dupixent )   Patient requested Courier to Provider Office   Delivery date: 02/13/24   Verified address: [address] [place], [address]   Medication will be filled on: 02/12/24

## 2024-02-12 ENCOUNTER — Other Ambulatory Visit: Payer: Self-pay

## 2024-02-17 ENCOUNTER — Ambulatory Visit

## 2024-02-17 DIAGNOSIS — L209 Atopic dermatitis, unspecified: Secondary | ICD-10-CM

## 2024-03-02 ENCOUNTER — Other Ambulatory Visit: Payer: Self-pay

## 2024-03-02 ENCOUNTER — Ambulatory Visit

## 2024-03-02 DIAGNOSIS — L209 Atopic dermatitis, unspecified: Secondary | ICD-10-CM

## 2024-03-04 ENCOUNTER — Other Ambulatory Visit: Payer: Self-pay

## 2024-03-04 NOTE — Progress Notes (Signed)
 Specialty Pharmacy Refill Coordination Note  Levi Phillips is a 83 y.o. male assessed today regarding refills of clinic administered specialty medication(s) Dupilumab  (Dupixent )   Clinic requested Courier to Provider Office   Delivery date: 03/09/24   Verified address: [address] [place], [address]   Medication will be filled on: 03/06/24   Appointment 03/16/24.

## 2024-03-06 ENCOUNTER — Other Ambulatory Visit: Payer: Self-pay

## 2024-03-16 ENCOUNTER — Ambulatory Visit (INDEPENDENT_AMBULATORY_CARE_PROVIDER_SITE_OTHER)

## 2024-03-16 DIAGNOSIS — L209 Atopic dermatitis, unspecified: Secondary | ICD-10-CM

## 2024-03-27 ENCOUNTER — Other Ambulatory Visit: Payer: Self-pay

## 2024-04-01 ENCOUNTER — Ambulatory Visit

## 2024-04-01 DIAGNOSIS — L209 Atopic dermatitis, unspecified: Secondary | ICD-10-CM

## 2024-04-06 ENCOUNTER — Ambulatory Visit: Admitting: Family Medicine

## 2024-04-08 ENCOUNTER — Other Ambulatory Visit: Payer: Self-pay

## 2024-04-10 ENCOUNTER — Other Ambulatory Visit: Payer: Self-pay

## 2024-04-13 ENCOUNTER — Other Ambulatory Visit (HOSPITAL_COMMUNITY): Payer: Self-pay

## 2024-04-13 ENCOUNTER — Other Ambulatory Visit: Payer: Self-pay

## 2024-04-14 ENCOUNTER — Other Ambulatory Visit: Payer: Self-pay

## 2024-04-17 ENCOUNTER — Encounter: Payer: Self-pay | Admitting: Family Medicine

## 2024-04-17 ENCOUNTER — Other Ambulatory Visit: Payer: Self-pay

## 2024-04-17 ENCOUNTER — Ambulatory Visit: Admitting: Family Medicine

## 2024-04-17 ENCOUNTER — Ambulatory Visit: Admitting: *Deleted

## 2024-04-17 VITALS — BP 126/72 | HR 82 | Temp 97.9°F

## 2024-04-17 DIAGNOSIS — D7219 Other eosinophilia: Secondary | ICD-10-CM

## 2024-04-17 DIAGNOSIS — J309 Allergic rhinitis, unspecified: Secondary | ICD-10-CM

## 2024-04-17 DIAGNOSIS — L989 Disorder of the skin and subcutaneous tissue, unspecified: Secondary | ICD-10-CM

## 2024-04-17 DIAGNOSIS — L299 Pruritus, unspecified: Secondary | ICD-10-CM

## 2024-04-17 DIAGNOSIS — L209 Atopic dermatitis, unspecified: Secondary | ICD-10-CM

## 2024-04-17 MED ORDER — LEVOCETIRIZINE DIHYDROCHLORIDE 5 MG PO TABS: 5.0000 mg | ORAL_TABLET | Freq: Two times a day (BID) | ORAL | 5 refills | Status: AC | PRN

## 2024-04-17 MED ORDER — FAMOTIDINE 20 MG PO TABS: 20.0000 mg | ORAL_TABLET | Freq: Two times a day (BID) | ORAL | 5 refills | Status: AC

## 2024-04-17 MED ORDER — TRIAMCINOLONE ACETONIDE 0.1 % EX CREA: 1.0000 | TOPICAL_CREAM | Freq: Two times a day (BID) | CUTANEOUS | 1 refills | Status: AC

## 2024-04-17 NOTE — Patient Instructions (Addendum)
" °  1.  Continue Levocetirizine 5 mg twice a day + Famotidine  20 mg twice a day  2.  Triamcinolone  0.1% cream 1-2 times a day if needed  3. Continue Dupixent  once every 2 weeks  4. Return to the clinic in 6 months or sooner if needed "

## 2024-04-17 NOTE — Progress Notes (Cosign Needed)
 "  [address] [place] [address] Dept: [phone]  FOLLOW UP NOTE  Patient ID: Levi Phillips, male    DOB: 1940-12-20  Age: 84 y.o. MRN: [id number] Date of Office Visit: 04/17/2024  Assessment  Chief Complaint: Follow-up and Pruritus (On backside and foot.)  HPI Levi Phillips is an 84 year old male who presents to clinic for follow-up visit.  He was last seen in this clinic on 01/20/2024 by Arlean Mutter, FNP, for evaluation of allergic rhinitis, atopic dermatitis, eosinophilia, and pruritus. An interpreter was available via iPad during the patient interview.  At today's visit, he reports pruritus has been moderately well controlled with occasional itching occurring on his back and backside as well as his right foot. He continues Leva cetirizine  5 mg twice a day and occasionally applies triamcinolone  with relief of it. He does report that is difficult to reach his back to apply the topical treatment. He continues Dupixent  injections once every two weeks with no larger local reactions. He reports a significant decrease in his level of pruritus while continuing Dupixent  injections. He reports that he has not been taking famotidine  as this is not been available at the pharmacy.  His current medications are listed in the chart. Drug Allergies:  Allergies[1]  Physical Exam: BP 126/72   Pulse 82   Temp 97.9 F (36.6 C)   SpO2 95%    Physical Exam Vitals reviewed.  Constitutional:      Appearance: Normal appearance.  HENT:     Head: Normocephalic and atraumatic.     Right Ear: Tympanic membrane normal.     Left Ear: Tympanic membrane normal.     Nose:     Comments: Bilateral naresnormal. Pharynx normal. Ears Normal. Eyes normal.    Mouth/Throat:     Pharynx: Oropharynx is clear.  Eyes:     Conjunctiva/sclera: Conjunctivae normal.  Cardiovascular:     Rate and Rhythm: Normal rate and regular rhythm.     Heart sounds: Normal heart sounds. No murmur heard. Pulmonary:      Effort: Pulmonary effort is normal.     Breath sounds: Normal breath sounds.     Comments: Lungs clear to auscultation Musculoskeletal:        General: Normal range of motion.     Cervical back: Normal range of motion and neck supple.  Skin:    General: Skin is warm and dry.  Neurological:     Mental Status: He is alert and oriented to person, place, and time.  Psychiatric:        Mood and Affect: Mood normal.        Behavior: Behavior normal.        Thought Content: Thought content normal.        Judgment: Judgment normal.     Assessment and Plan: 1. Inflammatory dermatosis   2. Pruritus   3. Other eosinophilia   4. Allergic rhinitis, unspecified seasonality, unspecified trigger     Meds ordered this encounter  Medications   famotidine  (PEPCID ) 20 MG tablet    Sig: Take 1 tablet (20 mg total) by mouth 2 (two) times daily.    Dispense:  60 tablet    Refill:  5   levocetirizine (XYZAL ) 5 MG tablet    Sig: Take 1 tablet (5 mg total) by mouth 2 (two) times daily as needed for allergies.    Dispense:  60 tablet    Refill:  5   triamcinolone  cream (KENALOG ) 0.1 %    Sig:  Apply 1 Application topically 2 (two) times daily.    Dispense:  454 g    Refill:  1    Patient Instructions   1.  Continue Levocetirizine 5 mg twice a day + Famotidine  20 mg twice a day  2.  Triamcinolone  0.1% cream 1-2 times a day if needed  3. Continue Dupixent  once every 2 weeks  4. Return to the clinic in 6 months or sooner if needed  Return in about 6 months (around 10/15/2024), or if symptoms worsen or fail to improve.    Thank you for the opportunity to care for this patient.  Please do not hesitate to contact me with questions.  Arlean Mutter, FNP [facility]          [1] No Known Allergies  "

## 2024-05-01 ENCOUNTER — Ambulatory Visit

## 2024-05-14 ENCOUNTER — Ambulatory Visit: Admitting: Family Medicine

## 2024-08-17 ENCOUNTER — Ambulatory Visit: Admitting: Family Medicine
